# Patient Record
Sex: Female | Born: 1945
Health system: Southern US, Community
[De-identification: ages and names within clinical notes are randomized; demographics above are authoritative.]

## PROBLEM LIST (undated history)

## (undated) DIAGNOSIS — G5601 Carpal tunnel syndrome, right upper limb: Secondary | ICD-10-CM

## (undated) DIAGNOSIS — I10 Essential (primary) hypertension: Secondary | ICD-10-CM

## (undated) DIAGNOSIS — N200 Calculus of kidney: Secondary | ICD-10-CM

## (undated) DIAGNOSIS — E785 Hyperlipidemia, unspecified: Secondary | ICD-10-CM

## (undated) DIAGNOSIS — T7840XA Allergy, unspecified, initial encounter: Secondary | ICD-10-CM

## (undated) DIAGNOSIS — K219 Gastro-esophageal reflux disease without esophagitis: Secondary | ICD-10-CM

## (undated) DIAGNOSIS — E042 Nontoxic multinodular goiter: Secondary | ICD-10-CM

## (undated) DIAGNOSIS — H269 Unspecified cataract: Secondary | ICD-10-CM

## (undated) DIAGNOSIS — Z87442 Personal history of urinary calculi: Secondary | ICD-10-CM

## (undated) DIAGNOSIS — E118 Type 2 diabetes mellitus with unspecified complications: Secondary | ICD-10-CM

## (undated) HISTORY — PX: BREAST SURGERY: SHX581

## (undated) HISTORY — PX: TONSILLECTOMY: SUR1361

## (undated) HISTORY — DX: Unspecified cataract: H26.9

## (undated) HISTORY — DX: Type 2 diabetes mellitus with unspecified complications: E11.8

## (undated) HISTORY — PX: DILATION AND CURETTAGE OF UTERUS: SHX78

## (undated) HISTORY — PX: EYE SURGERY: SHX253

## (undated) HISTORY — PX: ABDOMINAL HYSTERECTOMY: SHX81

## (undated) HISTORY — DX: Nontoxic multinodular goiter: E04.2

## (undated) HISTORY — DX: Calculus of kidney: N20.0

## (undated) HISTORY — DX: Carpal tunnel syndrome, right upper limb: G56.01

## (undated) HISTORY — DX: Allergy, unspecified, initial encounter: T78.40XA

## (undated) HISTORY — PX: OTHER SURGICAL HISTORY: SHX169

---

## 2014-04-18 LAB — HM DEXA SCAN

## 2015-01-25 LAB — HM COLONOSCOPY

## 2016-04-17 ENCOUNTER — Other Ambulatory Visit: Payer: Self-pay | Admitting: Medical Oncology

## 2016-04-17 DIAGNOSIS — R946 Abnormal results of thyroid function studies: Secondary | ICD-10-CM

## 2016-04-28 ENCOUNTER — Ambulatory Visit
Admission: RE | Admit: 2016-04-28 | Discharge: 2016-04-28 | Disposition: A | Discharge status: Home / Self Care | Payer: Medicare Other | Source: Ambulatory Visit | Attending: Medical Oncology | Admitting: Medical Oncology

## 2016-04-28 DIAGNOSIS — E042 Nontoxic multinodular goiter: Secondary | ICD-10-CM | POA: Diagnosis not present

## 2016-04-28 DIAGNOSIS — R946 Abnormal results of thyroid function studies: Secondary | ICD-10-CM | POA: Diagnosis not present

## 2016-05-14 ENCOUNTER — Other Ambulatory Visit: Payer: Self-pay | Admitting: Otolaryngology

## 2016-05-14 DIAGNOSIS — E041 Nontoxic single thyroid nodule: Secondary | ICD-10-CM

## 2016-05-20 ENCOUNTER — Other Ambulatory Visit: Payer: Self-pay | Admitting: Otolaryngology

## 2016-05-20 ENCOUNTER — Ambulatory Visit
Admission: RE | Admit: 2016-05-20 | Discharge: 2016-05-20 | Disposition: A | Discharge status: Home / Self Care | Payer: Medicare Other | Source: Ambulatory Visit | Attending: Otolaryngology | Admitting: Otolaryngology

## 2016-05-20 DIAGNOSIS — E041 Nontoxic single thyroid nodule: Secondary | ICD-10-CM

## 2016-05-20 DIAGNOSIS — E042 Nontoxic multinodular goiter: Secondary | ICD-10-CM | POA: Diagnosis not present

## 2016-05-20 HISTORY — DX: Gastro-esophageal reflux disease without esophagitis: K21.9

## 2016-05-20 HISTORY — DX: Essential (primary) hypertension: I10

## 2016-05-20 HISTORY — DX: Hyperlipidemia, unspecified: E78.5

## 2016-05-27 LAB — HM MAMMOGRAPHY

## 2016-06-09 DIAGNOSIS — E042 Nontoxic multinodular goiter: Secondary | ICD-10-CM | POA: Insufficient documentation

## 2016-11-13 DIAGNOSIS — H52223 Regular astigmatism, bilateral: Secondary | ICD-10-CM | POA: Diagnosis not present

## 2016-11-13 DIAGNOSIS — H40013 Open angle with borderline findings, low risk, bilateral: Secondary | ICD-10-CM | POA: Diagnosis not present

## 2016-11-13 DIAGNOSIS — H2513 Age-related nuclear cataract, bilateral: Secondary | ICD-10-CM | POA: Diagnosis not present

## 2016-11-13 DIAGNOSIS — H40053 Ocular hypertension, bilateral: Secondary | ICD-10-CM | POA: Diagnosis not present

## 2016-12-02 DIAGNOSIS — R739 Hyperglycemia, unspecified: Secondary | ICD-10-CM | POA: Diagnosis not present

## 2016-12-02 DIAGNOSIS — I10 Essential (primary) hypertension: Secondary | ICD-10-CM | POA: Diagnosis not present

## 2016-12-11 ENCOUNTER — Encounter: Payer: Self-pay | Admitting: Internal Medicine

## 2016-12-11 ENCOUNTER — Other Ambulatory Visit: Payer: Self-pay | Admitting: Internal Medicine

## 2016-12-11 DIAGNOSIS — E782 Mixed hyperlipidemia: Secondary | ICD-10-CM | POA: Insufficient documentation

## 2016-12-11 DIAGNOSIS — I10 Essential (primary) hypertension: Secondary | ICD-10-CM | POA: Insufficient documentation

## 2016-12-11 DIAGNOSIS — K219 Gastro-esophageal reflux disease without esophagitis: Secondary | ICD-10-CM | POA: Insufficient documentation

## 2016-12-11 DIAGNOSIS — M8580 Other specified disorders of bone density and structure, unspecified site: Secondary | ICD-10-CM | POA: Insufficient documentation

## 2016-12-11 DIAGNOSIS — N200 Calculus of kidney: Secondary | ICD-10-CM | POA: Insufficient documentation

## 2016-12-11 DIAGNOSIS — E785 Hyperlipidemia, unspecified: Secondary | ICD-10-CM | POA: Insufficient documentation

## 2016-12-11 DIAGNOSIS — E1169 Type 2 diabetes mellitus with other specified complication: Secondary | ICD-10-CM | POA: Insufficient documentation

## 2016-12-16 ENCOUNTER — Other Ambulatory Visit: Payer: Self-pay | Admitting: Internal Medicine

## 2016-12-16 ENCOUNTER — Ambulatory Visit (INDEPENDENT_AMBULATORY_CARE_PROVIDER_SITE_OTHER): Payer: Medicare Other | Admitting: Internal Medicine

## 2016-12-16 ENCOUNTER — Encounter: Payer: Self-pay | Admitting: Internal Medicine

## 2016-12-16 VITALS — BP 130/78 | HR 55 | Ht 63.0 in | Wt 176.8 lb

## 2016-12-16 DIAGNOSIS — Z860100 Personal history of colon polyps, unspecified: Secondary | ICD-10-CM

## 2016-12-16 DIAGNOSIS — M8580 Other specified disorders of bone density and structure, unspecified site: Secondary | ICD-10-CM

## 2016-12-16 DIAGNOSIS — E042 Nontoxic multinodular goiter: Secondary | ICD-10-CM | POA: Diagnosis not present

## 2016-12-16 DIAGNOSIS — Z1231 Encounter for screening mammogram for malignant neoplasm of breast: Secondary | ICD-10-CM

## 2016-12-16 DIAGNOSIS — K219 Gastro-esophageal reflux disease without esophagitis: Secondary | ICD-10-CM | POA: Diagnosis not present

## 2016-12-16 DIAGNOSIS — I10 Essential (primary) hypertension: Secondary | ICD-10-CM

## 2016-12-16 DIAGNOSIS — E782 Mixed hyperlipidemia: Secondary | ICD-10-CM

## 2016-12-16 NOTE — Progress Notes (Signed)
Date:  12/16/2016   Name:  Dawn Fields   DOB:  11/16/1945   MRN:  [id number]   Chief Complaint: Establish Care (Needs new PCP- insurance no longer accepted doctor in network.) Hypertension  Pertinent negatives include no chest pain, headaches, palpitations or shortness of breath. Identifiable causes of hypertension include a thyroid problem.  Gastroesophageal Reflux  She complains of heartburn. She reports no abdominal pain, no chest pain or no coughing. This is a recurrent problem. The problem occurs occasionally. Pertinent negatives include no fatigue. She has tried a histamine-2 antagonist for the symptoms. The treatment provided significant relief.  Hyperlipidemia  This is a chronic problem. The problem is uncontrolled. Pertinent negatives include no chest pain or shortness of breath. Current antihyperlipidemic treatment includes herbal therapy (intolerant of statins). Compliance problems include medication side effects.   Thyroid Problem  Patient reports no fatigue or palpitations. Her past medical history is significant for hyperlipidemia.  Pre-diabetes - had lost weight and changed her diet and A1c has been much better.  Last A1C = 6 last month.    Review of Systems  Constitutional: Negative for chills, fatigue and fever.  Respiratory: Negative for cough, chest tightness and shortness of breath.   Cardiovascular: Negative for chest pain, palpitations and leg swelling.  Gastrointestinal: Positive for heartburn. Negative for abdominal pain and blood in stool.  Genitourinary: Negative for dysuria.  Musculoskeletal: Negative for arthralgias.  Skin: Negative for color change and rash.  Neurological: Negative for dizziness and headaches.  Hematological: Negative for adenopathy.  Psychiatric/Behavioral: Negative for dysphoric mood.    Patient Active Problem List   Diagnosis Date Noted  . Essential hypertension 12/11/2016  . Mixed hyperlipidemia 12/11/2016  . Gastroesophageal  reflux disease 12/11/2016  . Kidney stones 12/11/2016  . Osteopenia 12/11/2016  . Multinodular goiter 06/09/2016  . Hyperglycemia 12/05/2015    Prior to Admission medications   Medication Sig Start Date End Date Taking? Authorizing Provider  aspirin EC 81 MG tablet Take 81 mg by mouth daily.   Yes [provider]  cholecalciferol (VITAMIN D) 400 units TABS tablet Take 2,000 Units by mouth 2 (two) times daily.   Yes [provider]  LISINOPRIL PO Take 20 mg by mouth once.   Yes [provider]  Omega-3 Fatty Acids (FISH OIL) 1200 MG CAPS Take 1,200 mg by mouth 1 day or 1 dose.   Yes [provider]  ONE TOUCH ULTRA TEST test strip  11/22/16  Yes [provider]  [place]  11/22/16  Yes [provider]  RaNITidine HCl (ZANTAC 75 PO) Take 150 mg by mouth as needed.   Yes [provider]  vitamin B-12 (CYANOCOBALAMIN) 1000 MCG tablet Take 5,000 mcg by mouth daily.   Yes [provider]    Allergies  Allergen Reactions  . Esomeprazole Magnesium Other (See Comments)    Other Reaction: GI UPSET  . Rosuvastatin Other (See Comments)    Joint pain    Past Surgical History:  Procedure Laterality Date  . ABDOMINAL HYSTERECTOMY     Partial - Still have ovaries. - Cyst on uterus   . TONSILLECTOMY      Social History  Substance Use Topics  . Smoking status: Never Smoker  . Smokeless tobacco: Never Used  . Alcohol use No     Medication list has been reviewed and updated.   Physical Exam  Constitutional: She is oriented to person, place, and time. She appears well-developed.  No distress.  HENT:  Head: Normocephalic and atraumatic.  Neck: Normal range of motion. Neck supple. Carotid bruit is not present.  Cardiovascular: Normal rate, regular rhythm and normal heart sounds.   Pulmonary/Chest: Effort normal and breath sounds normal. No respiratory distress. She has no wheezes. She has no  rales.  Musculoskeletal: She exhibits no edema or tenderness.  Neurological: She is alert and oriented to person, place, and time. She has normal reflexes.  Skin: Skin is warm and dry. No rash noted.  Psychiatric: She has a normal mood and affect. Her speech is normal and behavior is normal. Thought content normal.  Nursing note and vitals reviewed.   BP 130/78 (BP Location: Right Arm, Patient Position: Sitting, Cuff Size: Normal)   Pulse (!) 55   Ht 5\' 3"  (1.6 m)   Wt 176 lb 12.8 oz (80.2 kg)   SpO2 98%   BMI 31.32 kg/m   Assessment and Plan: 1. Essential hypertension controlled  2. Gastroesophageal reflux disease, esophagitis presence not specified Stable on H2 blocker  3. Multinodular goiter Followed by Dr. Kathyrn Sheriff  4. Mixed hyperlipidemia Intolerant of statins She accepts the risk - will continue fish oil, healthy diet and exercise  5. History of colonic polyps Due for colonoscopy next year  6. Encounter for screening mammogram for breast cancer - MM DIGITAL SCREENING BILATERAL  7. Osteopenia, unspecified location Continue exercise daily   No orders of the defined types were placed in this encounter.   Halina Maidens, MD [facility]  12/16/2016

## 2017-03-23 ENCOUNTER — Telehealth: Payer: Self-pay | Admitting: Internal Medicine

## 2017-03-23 NOTE — Telephone Encounter (Signed)
Called pt to schedule for Annual Wellness Visit with Nurse Health Advisor, Tiffany Hill, my c/b # is [phone]  Kathryn Brown ° °

## 2017-04-09 DIAGNOSIS — H52223 Regular astigmatism, bilateral: Secondary | ICD-10-CM | POA: Diagnosis not present

## 2017-04-09 DIAGNOSIS — H40013 Open angle with borderline findings, low risk, bilateral: Secondary | ICD-10-CM | POA: Diagnosis not present

## 2017-04-26 ENCOUNTER — Other Ambulatory Visit: Payer: Self-pay | Admitting: Otolaryngology

## 2017-04-26 DIAGNOSIS — E041 Nontoxic single thyroid nodule: Secondary | ICD-10-CM

## 2017-05-19 ENCOUNTER — Encounter: Payer: Self-pay | Admitting: Internal Medicine

## 2017-05-19 DIAGNOSIS — Z1231 Encounter for screening mammogram for malignant neoplasm of breast: Secondary | ICD-10-CM | POA: Diagnosis not present

## 2017-05-19 LAB — HM MAMMOGRAPHY

## 2017-05-19 NOTE — Progress Notes (Unsigned)
[id number] 

## 2017-05-25 ENCOUNTER — Ambulatory Visit
Admission: RE | Admit: 2017-05-25 | Discharge: 2017-05-25 | Disposition: A | Discharge status: Home / Self Care | Payer: Medicare Other | Source: Ambulatory Visit | Attending: Otolaryngology | Admitting: Otolaryngology

## 2017-05-25 DIAGNOSIS — E042 Nontoxic multinodular goiter: Secondary | ICD-10-CM | POA: Diagnosis not present

## 2017-05-25 DIAGNOSIS — E041 Nontoxic single thyroid nodule: Secondary | ICD-10-CM

## 2017-06-01 DIAGNOSIS — E041 Nontoxic single thyroid nodule: Secondary | ICD-10-CM | POA: Diagnosis not present

## 2017-06-07 DIAGNOSIS — H6121 Impacted cerumen, right ear: Secondary | ICD-10-CM | POA: Diagnosis not present

## 2017-06-07 DIAGNOSIS — M26609 Unspecified temporomandibular joint disorder, unspecified side: Secondary | ICD-10-CM | POA: Diagnosis not present

## 2017-06-07 DIAGNOSIS — E042 Nontoxic multinodular goiter: Secondary | ICD-10-CM | POA: Diagnosis not present

## 2017-06-07 DIAGNOSIS — H60339 Swimmer's ear, unspecified ear: Secondary | ICD-10-CM | POA: Diagnosis not present

## 2017-06-07 DIAGNOSIS — H9319 Tinnitus, unspecified ear: Secondary | ICD-10-CM | POA: Diagnosis not present

## 2017-06-23 ENCOUNTER — Encounter: Payer: Self-pay | Admitting: Internal Medicine

## 2017-06-23 ENCOUNTER — Ambulatory Visit (INDEPENDENT_AMBULATORY_CARE_PROVIDER_SITE_OTHER): Payer: Medicare Other | Admitting: Internal Medicine

## 2017-06-23 VITALS — BP 110/78 | HR 72 | Ht 63.0 in | Wt 181.0 lb

## 2017-06-23 DIAGNOSIS — Z23 Encounter for immunization: Secondary | ICD-10-CM

## 2017-06-23 DIAGNOSIS — Z0000 Encounter for general adult medical examination without abnormal findings: Secondary | ICD-10-CM | POA: Diagnosis not present

## 2017-06-23 DIAGNOSIS — R739 Hyperglycemia, unspecified: Secondary | ICD-10-CM

## 2017-06-23 DIAGNOSIS — K219 Gastro-esophageal reflux disease without esophagitis: Secondary | ICD-10-CM | POA: Diagnosis not present

## 2017-06-23 DIAGNOSIS — E042 Nontoxic multinodular goiter: Secondary | ICD-10-CM

## 2017-06-23 DIAGNOSIS — E782 Mixed hyperlipidemia: Secondary | ICD-10-CM

## 2017-06-23 DIAGNOSIS — I10 Essential (primary) hypertension: Secondary | ICD-10-CM | POA: Diagnosis not present

## 2017-06-23 LAB — POCT URINALYSIS DIPSTICK
Bilirubin, UA: NEGATIVE
Blood, UA: NEGATIVE
Glucose, UA: NEGATIVE
Ketones, UA: NEGATIVE
Leukocytes, UA: NEGATIVE
Nitrite, UA: NEGATIVE
Protein, UA: NEGATIVE
Spec Grav, UA: 1.025
Urobilinogen, UA: 0.2 U/dL
pH, UA: 6

## 2017-06-23 MED ORDER — ZOSTER VAC RECOMB ADJUVANTED 50 MCG/0.5ML IM SUSR
0.5000 mL | Freq: Once | INTRAMUSCULAR | 1 refills | Status: AC
Start: 2017-06-23 — End: 2017-06-23

## 2017-06-23 NOTE — Addendum Note (Signed)
Addended by: Glean Hess on: 06/23/2017 12:09 PM   Modules accepted: Level of Service

## 2017-06-23 NOTE — Patient Instructions (Addendum)
Health Maintenance  Topic Date Due  . INFLUENZA VACCINE  03/10/2018  . MAMMOGRAM  05/19/2018  . TETANUS/TDAP  03/07/2020  . COLONOSCOPY  01/24/2025  . DEXA SCAN  Completed  . Hepatitis C Screening  Completed  . PNA vac Low Risk Adult  Completed

## 2017-06-23 NOTE — Progress Notes (Signed)
Patient: Dawn Fields, Female    DOB: [birth year], 71 y.o.   MRN: [id number] Visit Date: 06/23/2017  Today's Provider: Halina Maidens, MD   Chief Complaint  Patient presents with  . Medicare Wellness    Breast Exam.   . Hypertension  . Gastroesophageal Reflux   Subjective:    Annual wellness visit Dawn Fields is a 71 y.o. female who presents today for her Subsequent Annual Wellness Visit. She feels well. She reports exercising 4 days a week to the gym. She reports she is sleeping well. Mammogram done recently.  ----------------------------------------------------------- Hypertension  Pertinent negatives include no chest pain, headaches, palpitations or shortness of breath. Identifiable causes of hypertension include a thyroid problem.  Gastroesophageal Reflux  She reports no abdominal pain, no chest pain, no coughing or no wheezing. Pertinent negatives include no fatigue.  Thyroid Problem  Presents for follow-up (seen by ENT.  Recent US showed multiple small nodules too small to FNA) visit. Patient reports no anxiety, constipation, diarrhea, fatigue, palpitations or tremors. Her past medical history is significant for hyperlipidemia.  Hyperlipidemia  This is a chronic problem. The problem is uncontrolled. Pertinent negatives include no chest pain or shortness of breath. She is currently on no antihyperlipidemic treatment (did not tolerate statins).  Pre-diabetes - has been controlled with diet and weight loss.   Review of Systems  Constitutional: Negative for chills, fatigue and fever.  HENT: Negative for congestion, hearing loss, tinnitus, trouble swallowing and voice change.   Eyes: Negative for visual disturbance.  Respiratory: Negative for cough, chest tightness, shortness of breath and wheezing.   Cardiovascular: Negative for chest pain, palpitations and leg swelling.  Gastrointestinal: Negative for abdominal pain, constipation, diarrhea and vomiting.  Endocrine: Negative  for polydipsia and polyuria.  Genitourinary: Negative for dysuria, frequency, genital sores, vaginal bleeding and vaginal discharge.  Musculoskeletal: Negative for arthralgias, gait problem and joint swelling.  Skin: Negative for color change and rash.  Neurological: Negative for dizziness, tremors, light-headedness and headaches.  Hematological: Negative for adenopathy. Does not bruise/bleed easily.  Psychiatric/Behavioral: Negative for dysphoric mood and sleep disturbance. The patient is not nervous/anxious.     Social History   Socioeconomic History  . Marital status: Single    Spouse name: Not on file  . Number of children: 1  . Years of education: Not on file  . Highest education level: Not on file  Social Needs  . Financial resource strain: Not hard at all  . Food insecurity - worry: Never true  . Food insecurity - inability: Never true  . Transportation needs - medical: No  . Transportation needs - non-medical: No  Occupational History  . Occupation: retired  Tobacco Use  . Smoking status: Never Smoker  . Smokeless tobacco: Never Used  Substance and Sexual Activity  . Alcohol use: No  . Drug use: No  . Sexual activity: Not on file  Other Topics Concern  . Not on file  Social History Narrative  . Not on file    Patient Active Problem List   Diagnosis Date Noted  . History of colonic polyps 12/16/2016  . Essential hypertension 12/11/2016  . Mixed hyperlipidemia 12/11/2016  . Gastroesophageal reflux disease 12/11/2016  . Kidney stones 12/11/2016  . Osteopenia 12/11/2016  . Multinodular goiter 06/09/2016  . Hyperglycemia 12/05/2015    Past Surgical History:  Procedure Laterality Date  . ABDOMINAL HYSTERECTOMY     Partial - Still have ovaries. - Cyst on uterus   . TONSILLECTOMY  Her family history includes Diabetes in her brother, maternal grandfather, maternal grandmother, and sister; Hypertension in her mother; Liver cancer in her father; Stroke in her  brother and mother.     Current Meds  Medication Sig  . aspirin EC 81 MG tablet Take 81 mg by mouth daily.  . cholecalciferol (VITAMIN D) 400 units TABS tablet Take 2,000 Units by mouth 2 (two) times daily.  Marland Kitchen LISINOPRIL PO Take 20 mg by mouth once.  . Omega-3 Fatty Acids (FISH OIL) 1200 MG CAPS Take 1,200 mg by mouth 1 day or 1 dose.  . ONE TOUCH ULTRA TEST test strip   . RaNITidine HCl (ZANTAC 75 PO) Take 150 mg by mouth as needed.    Patient Care Team: Glean Hess, MD as PCP - General (Internal Medicine) Margaretha Sheffield, MD (Otolaryngology)       Objective:   Vitals: BP 110/78   Pulse 72   Ht 5\' 3"  (1.6 m)   Wt 181 lb (82.1 kg)   SpO2 98%   BMI 32.06 kg/m   Physical Exam  Constitutional: She is oriented to person, place, and time. She appears well-developed and well-nourished. No distress.  HENT:  Head: Normocephalic and atraumatic.  Right Ear: Tympanic membrane and ear canal normal.  Left Ear: Tympanic membrane and ear canal normal.  Nose: Right sinus exhibits no maxillary sinus tenderness. Left sinus exhibits no maxillary sinus tenderness.  Mouth/Throat: Uvula is midline and oropharynx is clear and moist.  Eyes: Conjunctivae and EOM are normal. Right eye exhibits no discharge. Left eye exhibits no discharge. No scleral icterus.  Neck: Normal range of motion. Carotid bruit is not present. No erythema present. No thyromegaly present.  Cardiovascular: Normal rate, regular rhythm, normal heart sounds and normal pulses.  Pulmonary/Chest: Effort normal. No respiratory distress. She has no wheezes. Right breast exhibits no mass, no nipple discharge, no skin change and no tenderness. Left breast exhibits no mass, no nipple discharge, no skin change and no tenderness.  Abdominal: Soft. Bowel sounds are normal. There is no hepatosplenomegaly. There is no tenderness. There is no CVA tenderness.  Musculoskeletal: Normal range of motion.  Lymphadenopathy:    She has no cervical  adenopathy.    She has no axillary adenopathy.  Neurological: She is alert and oriented to person, place, and time. She has normal reflexes. No cranial nerve deficit or sensory deficit.  Skin: Skin is warm, dry and intact. No rash noted.  Psychiatric: She has a normal mood and affect. Her speech is normal and behavior is normal. Thought content normal.  Nursing note and vitals reviewed.   Activities of Daily Living In your present state of health, do you have any difficulty performing the following activities: 06/23/2017  Hearing? N  Vision? N  Difficulty concentrating or making decisions? N  Walking or climbing stairs? N  Dressing or bathing? N  Doing errands, shopping? N  Preparing Food and eating ? N  Using the Toilet? N  In the past six months, have you accidently leaked urine? N  Do you have problems with loss of bowel control? N  Managing your Medications? N  Managing your Finances? N  Housekeeping or managing your Housekeeping? N  Some recent data might be hidden    Fall Risk Assessment Fall Risk  06/23/2017  Falls in the past year? No     Depression Screen PHQ 2/9 Scores 06/23/2017  PHQ - 2 Score 0    6CIT Screen 06/23/2017  What Year?  0 points  What month? 0 points  What time? 0 points  Count back from 20 0 points  Months in reverse 0 points  Repeat phrase 0 points  Total Score 0    Medicare Annual Wellness Visit Summary:  Reviewed patient's Family Medical History Reviewed and updated list of patient's medical providers Assessment of cognitive impairment was done Assessed patient's functional ability Established a written schedule for health screening [place] Completed and Reviewed  Exercise Activities and Dietary recommendations Goals    None      Immunization History  Administered Date(s) Administered  . Influenza, Seasonal, Injecte, Preservative Fre 05/09/2014  . Influenza,inj,Quad PF,6+ Mos 06/02/2016  .  Influenza-Unspecified 05/09/2014, 05/27/2015, 05/10/2017  . Pneumococcal Conjugate-13 11/06/2013  . Pneumococcal Polysaccharide-23 08/10/2010  . Tdap 03/07/2010  . Zoster 11/02/2012    Health Maintenance  Topic Date Due  . INFLUENZA VACCINE  03/10/2017  . MAMMOGRAM  05/20/2019  . TETANUS/TDAP  03/07/2020  . COLONOSCOPY  01/24/2025  . DEXA SCAN  Completed  . Hepatitis C Screening  Completed  . PNA vac Low Risk Adult  Completed    Discussed health benefits of physical activity, and encouraged her to engage in regular exercise appropriate for her age and condition.    ------------------------------------------------------------------------------------------------------------  Assessment & Plan:  1. Medicare annual wellness visit, subsequent Measures satisfied  2. Essential hypertension controlled - CBC with Differential/Platelet - Comprehensive metabolic panel - POCT urinalysis dipstick  3. Gastroesophageal reflux disease without esophagitis Controlled on zantac  4. Multinodular goiter Followed by ENT  5. Mixed hyperlipidemia Continue supplements Intolerant of statins - Lipid panel  6. Hyperglycemia Continue diet and exercise Monitor BS - Hemoglobin A1c   No orders of the defined types were placed in this encounter.   Partially dictated using Editor, commissioning. Any errors are unintentional.  Halina Maidens, MD [facility]  06/23/2017

## 2017-06-24 LAB — CBC WITH DIFFERENTIAL/PLATELET
BASOS ABS: 0 10*3/uL (ref 0.0–0.2)
Basos: 0 %
EOS (ABSOLUTE): 0.1 10*3/uL (ref 0.0–0.4)
Eos: 2 %
Hematocrit: 34.8 % (ref 34.0–46.6)
Hemoglobin: 11.7 g/dL (ref 11.1–15.9)
IMMATURE GRANULOCYTES: 0 %
Immature Grans (Abs): 0 10*3/uL (ref 0.0–0.1)
Lymphocytes Absolute: 2.5 10*3/uL (ref 0.7–3.1)
Lymphs: 53 %
MCH: 29.2 pg (ref 26.6–33.0)
MCHC: 33.6 g/dL (ref 31.5–35.7)
MCV: 87 fL (ref 79–97)
Monocytes Absolute: 0.4 10*3/uL (ref 0.1–0.9)
Monocytes: 9 %
NEUTROS PCT: 36 %
Neutrophils Absolute: 1.7 10*3/uL (ref 1.4–7.0)
Platelets: 330 10*3/uL (ref 150–379)
RBC: 4.01 x10E6/uL (ref 3.77–5.28)
RDW: 14 % (ref 12.3–15.4)
WBC: 4.7 10*3/uL (ref 3.4–10.8)

## 2017-06-24 LAB — COMPREHENSIVE METABOLIC PANEL
ALT: 15 IU/L (ref 0–32)
AST: 19 IU/L (ref 0–40)
Albumin/Globulin Ratio: 1.7 (ref 1.2–2.2)
Albumin: 4.7 g/dL (ref 3.5–4.8)
Alkaline Phosphatase: 77 IU/L (ref 39–117)
BUN/Creatinine Ratio: 13 (ref 12–28)
BUN: 12 mg/dL (ref 8–27)
Bilirubin Total: 0.4 mg/dL (ref 0.0–1.2)
CALCIUM: 9.6 mg/dL (ref 8.7–10.3)
CHLORIDE: 103 mmol/L (ref 96–106)
CO2: 24 mmol/L (ref 20–29)
Creatinine, Ser: 0.94 mg/dL (ref 0.57–1.00)
GFR, EST AFRICAN AMERICAN: 70 mL/min/{1.73_m2} (ref >59)
GFR, EST NON AFRICAN AMERICAN: 60 mL/min/{1.73_m2} (ref >59)
GLUCOSE: 92 mg/dL (ref 65–99)
Globulin, Total: 2.7 g/dL (ref 1.5–4.5)
Potassium: 4.6 mmol/L (ref 3.5–5.2)
Sodium: 140 mmol/L (ref 134–144)
TOTAL PROTEIN: 7.4 g/dL (ref 6.0–8.5)

## 2017-06-24 LAB — HEMOGLOBIN A1C
Est. average glucose Bld gHb Est-mCnc: 134 mg/dL
HEMOGLOBIN A1C: 6.3 % — AB (ref 4.8–5.6)

## 2017-06-24 LAB — LIPID PANEL
CHOL/HDL RATIO: 2.3 ratio (ref 0.0–4.4)
Cholesterol, Total: 212 mg/dL — ABNORMAL HIGH (ref 100–199)
HDL: 91 mg/dL (ref >39)
LDL Calculated: 102 mg/dL — ABNORMAL HIGH (ref 0–99)
Triglycerides: 96 mg/dL (ref 0–149)
VLDL CHOLESTEROL CAL: 19 mg/dL (ref 5–40)

## 2017-08-16 ENCOUNTER — Other Ambulatory Visit: Payer: Self-pay | Admitting: Internal Medicine

## 2017-08-16 ENCOUNTER — Other Ambulatory Visit: Payer: Self-pay

## 2017-10-14 DIAGNOSIS — H2513 Age-related nuclear cataract, bilateral: Secondary | ICD-10-CM | POA: Diagnosis not present

## 2017-10-14 DIAGNOSIS — H04123 Dry eye syndrome of bilateral lacrimal glands: Secondary | ICD-10-CM | POA: Diagnosis not present

## 2017-10-14 DIAGNOSIS — H52223 Regular astigmatism, bilateral: Secondary | ICD-10-CM | POA: Diagnosis not present

## 2017-10-14 DIAGNOSIS — H40013 Open angle with borderline findings, low risk, bilateral: Secondary | ICD-10-CM | POA: Diagnosis not present

## 2017-11-23 ENCOUNTER — Telehealth: Payer: Self-pay | Admitting: Internal Medicine

## 2017-11-23 NOTE — Telephone Encounter (Signed)
resched awv  °

## 2017-12-07 ENCOUNTER — Other Ambulatory Visit: Payer: Self-pay | Admitting: Internal Medicine

## 2017-12-24 ENCOUNTER — Ambulatory Visit: Payer: Medicare Other | Admitting: Internal Medicine

## 2017-12-24 ENCOUNTER — Encounter: Payer: Self-pay | Admitting: Internal Medicine

## 2017-12-24 VITALS — BP 118/78 | HR 68 | Resp 16 | Ht 63.0 in | Wt 179.0 lb

## 2017-12-24 DIAGNOSIS — I10 Essential (primary) hypertension: Secondary | ICD-10-CM | POA: Diagnosis not present

## 2017-12-24 DIAGNOSIS — E118 Type 2 diabetes mellitus with unspecified complications: Secondary | ICD-10-CM | POA: Insufficient documentation

## 2017-12-24 DIAGNOSIS — E782 Mixed hyperlipidemia: Secondary | ICD-10-CM | POA: Diagnosis not present

## 2017-12-24 DIAGNOSIS — M25362 Other instability, left knee: Secondary | ICD-10-CM

## 2017-12-24 DIAGNOSIS — R7303 Prediabetes: Secondary | ICD-10-CM

## 2017-12-24 MED ORDER — LISINOPRIL 20 MG PO TABS: 20.0000 mg | ORAL_TABLET | Freq: Every day | ORAL | 3 refills | Status: DC

## 2017-12-24 NOTE — Progress Notes (Signed)
Date:  12/24/2017   Name:  Dawn Fields   DOB:  1946/07/27   MRN:  [id number]   Chief Complaint: Hypertension Hypertension  This is a chronic problem. The problem is controlled. Pertinent negatives include no chest pain, headaches, palpitations or shortness of breath. Past treatments include ACE inhibitors.  Diabetes  She presents for her follow-up diabetic visit. Diabetes type: pre-diabetes. Pertinent negatives for hypoglycemia include no dizziness, headaches or tremors. Pertinent negatives for diabetes include no chest pain, no fatigue, no polydipsia and no polyuria. Current diabetic treatment includes diet. Her weight is decreasing steadily. She monitors blood glucose at home 1-2 x per day. Her breakfast blood glucose is taken between 6-7 am. Her breakfast blood glucose range is generally 110-130 mg/dl. An ACE inhibitor/angiotensin II receptor blocker is being taken.  Hyperlipidemia  This is a chronic problem. The problem is uncontrolled. Pertinent negatives include no chest pain or shortness of breath. Current antihyperlipidemic treatment includes diet change, exercise and herbal therapy.  Knee Pain   The injury mechanism was a direct blow (10 years ago -). The pain is present in the left knee. The patient is experiencing no pain. Associated symptoms include muscle weakness (sense of weakness with exertion). Pertinent negatives include no numbness.   Lab Results  Component Value Date   HGBA1C 6.3 (H) 06/23/2017   Lab Results  Component Value Date   CREATININE 0.94 06/23/2017   BUN 12 06/23/2017   NA 140 06/23/2017   K 4.6 06/23/2017   CL 103 06/23/2017   CO2 24 06/23/2017      Review of Systems  Constitutional: Negative for appetite change, fatigue, fever and unexpected weight change.  HENT: Negative for tinnitus and trouble swallowing.   Eyes: Negative for visual disturbance.  Respiratory: Negative for cough, chest tightness and shortness of breath.   Cardiovascular:  Negative for chest pain, palpitations and leg swelling.  Gastrointestinal: Negative for abdominal pain.  Endocrine: Negative for polydipsia and polyuria.  Genitourinary: Negative for dysuria and hematuria.  Musculoskeletal: Negative for arthralgias.       Has some intermittent sensation of weakness in her left knee - wears a brace to exercise; has not fallen  Neurological: Negative for dizziness, tremors, numbness and headaches.  Psychiatric/Behavioral: Negative for dysphoric mood.    Patient Active Problem List   Diagnosis Date Noted  . Prediabetes 12/24/2017  . History of colonic polyps 12/16/2016  . Essential hypertension 12/11/2016  . Mixed hyperlipidemia 12/11/2016  . Gastroesophageal reflux disease 12/11/2016  . Kidney stones 12/11/2016  . Osteopenia 12/11/2016  . Multinodular goiter 06/09/2016  . Hyperglycemia 12/05/2015    Prior to Admission medications   Medication Sig Start Date End Date Taking? Authorizing Provider  aspirin EC 81 MG tablet Take 81 mg by mouth daily.    [provider]  cholecalciferol (VITAMIN D) 400 units TABS tablet Take 2,000 Units by mouth 2 (two) times daily.    [provider]  glucose blood (ONE TOUCH ULTRA TEST) test strip USE AS DIRECTED ONCE DAILY 08/16/17   Glean Hess, MD  lisinopril (PRINIVIL,ZESTRIL) 20 MG tablet TAKE 1 TABLET BY MOUTH EVERY DAY *NEED APPT FOR FURTHER REFILLS* 12/07/17   Glean Hess, MD  Omega-3 Fatty Acids (FISH OIL) 1200 MG CAPS Take 1,200 mg by mouth 1 day or 1 dose.    [provider]  [facility] DELICA LANCETS FINE MISC USE AS DIRECTED ONCE DAILY 08/16/17   Glean Hess, MD  Plant Sterols and  Stanols (CHOLESTOFF PLUS PO) Take by mouth.    [provider]  RaNITidine HCl (ZANTAC 75 PO) Take 150 mg by mouth as needed.    [provider]  vitamin B-12 (CYANOCOBALAMIN) 1000 MCG tablet Take 5,000 mcg by mouth daily.    [provider]    Allergies  Allergen  Reactions  . Esomeprazole Magnesium Other (See Comments)    Other Reaction: GI UPSET  . Rosuvastatin Other (See Comments)    Joint pain    Past Surgical History:  Procedure Laterality Date  . ABDOMINAL HYSTERECTOMY     Partial - Still have ovaries. - Cyst on uterus   . TONSILLECTOMY      Social History   Tobacco Use  . Smoking status: Never Smoker  . Smokeless tobacco: Never Used  Substance Use Topics  . Alcohol use: No  . Drug use: No   Wt Readings from Last 3 Encounters:  12/24/17 179 lb (81.2 kg)  06/23/17 181 lb (82.1 kg)  12/16/16 176 lb 12.8 oz (80.2 kg)     Medication list has been reviewed and updated.  PHQ 2/9 Scores 06/23/2017  PHQ - 2 Score 0    Physical Exam  Constitutional: She is oriented to person, place, and time. She appears well-developed. No distress.  HENT:  Head: Normocephalic and atraumatic.  Eyes: Pupils are equal, round, and reactive to light.  Neck: Normal range of motion. Neck supple. Carotid bruit is not present.  Cardiovascular: Normal rate, regular rhythm and normal heart sounds.  Pulmonary/Chest: Effort normal and breath sounds normal. No respiratory distress.  Musculoskeletal: Normal range of motion.       Left knee: She exhibits normal range of motion and no effusion. No tenderness found.       Legs: Neurological: She is alert and oriented to person, place, and time.  Skin: Skin is warm and dry. No rash noted.  Psychiatric: She has a normal mood and affect. Her speech is normal and behavior is normal. Thought content normal.  Nursing note and vitals reviewed.   BP 118/78   Pulse 68   Resp 16   Ht 5\' 3"  (1.6 m)   Wt 179 lb (81.2 kg)   SpO2 98%   BMI 31.71 kg/m   Assessment and Plan: 1. Essential hypertension Controlled, continue regular exercise - lisinopril (PRINIVIL,ZESTRIL) 20 MG tablet; Take 1 tablet (20 mg total) by mouth daily.  Dispense: 90 tablet; Refill: 3  2. Prediabetes Fasting BS ~ 120  3. Mixed  hyperlipidemia Pt still declines statins - discussed that most of her risk is related to age, rather than BP or BS  4. Knee instability, left Continue brace for exercise If worsening, will need Orthopedic evaluation   Meds ordered this encounter  Medications  . lisinopril (PRINIVIL,ZESTRIL) 20 MG tablet    Sig: Take 1 tablet (20 mg total) by mouth daily.    Dispense:  90 tablet    Refill:  3    Partially dictated using Editor, commissioning. Any errors are unintentional.  Halina Maidens, MD [facility]  12/24/2017

## 2017-12-25 LAB — HEMOGLOBIN A1C
ESTIMATED AVERAGE GLUCOSE: 128 mg/dL
HEMOGLOBIN A1C: 6.1 % — AB (ref 4.8–5.6)

## 2018-02-04 ENCOUNTER — Encounter: Payer: Self-pay | Admitting: Internal Medicine

## 2018-02-04 DIAGNOSIS — Z86010 Personal history of colonic polyps: Secondary | ICD-10-CM | POA: Diagnosis not present

## 2018-02-04 DIAGNOSIS — K5730 Diverticulosis of large intestine without perforation or abscess without bleeding: Secondary | ICD-10-CM | POA: Diagnosis not present

## 2018-02-04 DIAGNOSIS — D125 Benign neoplasm of sigmoid colon: Secondary | ICD-10-CM | POA: Diagnosis not present

## 2018-02-04 HISTORY — PX: COLONOSCOPY: SHX174

## 2018-02-04 LAB — HM COLONOSCOPY

## 2018-04-14 DIAGNOSIS — H25013 Cortical age-related cataract, bilateral: Secondary | ICD-10-CM | POA: Diagnosis not present

## 2018-04-14 DIAGNOSIS — H401131 Primary open-angle glaucoma, bilateral, mild stage: Secondary | ICD-10-CM | POA: Diagnosis not present

## 2018-04-14 DIAGNOSIS — H2513 Age-related nuclear cataract, bilateral: Secondary | ICD-10-CM | POA: Diagnosis not present

## 2018-04-14 DIAGNOSIS — H40053 Ocular hypertension, bilateral: Secondary | ICD-10-CM | POA: Diagnosis not present

## 2018-05-04 DIAGNOSIS — H401131 Primary open-angle glaucoma, bilateral, mild stage: Secondary | ICD-10-CM | POA: Diagnosis not present

## 2018-05-04 DIAGNOSIS — H25013 Cortical age-related cataract, bilateral: Secondary | ICD-10-CM | POA: Diagnosis not present

## 2018-05-04 DIAGNOSIS — H40053 Ocular hypertension, bilateral: Secondary | ICD-10-CM | POA: Diagnosis not present

## 2018-05-04 DIAGNOSIS — H2513 Age-related nuclear cataract, bilateral: Secondary | ICD-10-CM | POA: Diagnosis not present

## 2018-05-24 DIAGNOSIS — Z23 Encounter for immunization: Secondary | ICD-10-CM | POA: Diagnosis not present

## 2018-06-01 DIAGNOSIS — Z1231 Encounter for screening mammogram for malignant neoplasm of breast: Secondary | ICD-10-CM | POA: Diagnosis not present

## 2018-06-23 ENCOUNTER — Ambulatory Visit: Payer: Medicare Other

## 2018-06-27 ENCOUNTER — Ambulatory Visit (INDEPENDENT_AMBULATORY_CARE_PROVIDER_SITE_OTHER): Payer: Medicare Other | Admitting: Internal Medicine

## 2018-06-27 ENCOUNTER — Encounter: Payer: Self-pay | Admitting: Internal Medicine

## 2018-06-27 VITALS — BP 128/80 | HR 70 | Ht 63.0 in | Wt 182.0 lb

## 2018-06-27 DIAGNOSIS — I10 Essential (primary) hypertension: Secondary | ICD-10-CM

## 2018-06-27 DIAGNOSIS — R7303 Prediabetes: Secondary | ICD-10-CM

## 2018-06-27 DIAGNOSIS — E042 Nontoxic multinodular goiter: Secondary | ICD-10-CM | POA: Diagnosis not present

## 2018-06-27 DIAGNOSIS — Z1231 Encounter for screening mammogram for malignant neoplasm of breast: Secondary | ICD-10-CM

## 2018-06-27 DIAGNOSIS — K219 Gastro-esophageal reflux disease without esophagitis: Secondary | ICD-10-CM | POA: Diagnosis not present

## 2018-06-27 DIAGNOSIS — Z0000 Encounter for general adult medical examination without abnormal findings: Secondary | ICD-10-CM | POA: Diagnosis not present

## 2018-06-27 LAB — POCT URINALYSIS DIPSTICK
Bilirubin, UA: NEGATIVE
GLUCOSE UA: NEGATIVE
Ketones, UA: NEGATIVE
LEUKOCYTES UA: NEGATIVE
NITRITE UA: NEGATIVE
PROTEIN UA: NEGATIVE
RBC UA: NEGATIVE
SPEC GRAV UA: 1.015 (ref 1.010–1.025)
Urobilinogen, UA: 0.2 E.U./dL
pH, UA: 6 (ref 5.0–8.0)

## 2018-06-27 NOTE — Progress Notes (Signed)
Date:  06/27/2018   Name:  Dawn Fields   DOB:  03/13/1946   MRN:  [id number]   Chief Complaint: Annual Exam (Medicare wellness is scheduled to be done in two days. Breast Exam. ) Dawn Fields is a 72 y.o. female who presents today for her Complete Annual Exam. She feels well. She reports exercising four times a week. She reports she is sleeping well. She is up to date on immunizations.  Mammogram was done in October at [facility].  Hypertension  This is a chronic problem. The problem is controlled. Pertinent negatives include no chest pain, headaches, palpitations or shortness of breath. Past treatments include ACE inhibitors. The current treatment provides significant improvement. There are no compliance problems.   Gastroesophageal Reflux  She reports no abdominal pain, no chest pain, no coughing or no wheezing. This is a recurrent problem. The problem occurs rarely. Pertinent negatives include no fatigue. She has tried an antacid for the symptoms. The treatment provided significant relief.  Glaucoma - recent diagnosis - now on drops and has planned follow up.  Review of Systems  Constitutional: Negative for chills, fatigue and fever.  HENT: Negative for congestion, hearing loss, tinnitus, trouble swallowing and voice change.   Eyes: Positive for visual disturbance (being followed for glaucoma).  Respiratory: Negative for cough, chest tightness, shortness of breath and wheezing.   Cardiovascular: Negative for chest pain, palpitations and leg swelling.  Gastrointestinal: Negative for abdominal pain, constipation, diarrhea and vomiting.  Endocrine: Negative for polydipsia and polyuria.  Genitourinary: Negative for dysuria, frequency, genital sores, vaginal bleeding and vaginal discharge.  Musculoskeletal: Negative for arthralgias, gait problem and joint swelling.  Skin: Negative for color change and rash.  Allergic/Immunologic: Negative for environmental allergies.  Neurological:  Negative for dizziness, tremors, light-headedness and headaches.  Hematological: Negative for adenopathy. Does not bruise/bleed easily.  Psychiatric/Behavioral: Negative for dysphoric mood and sleep disturbance. The patient is not nervous/anxious.     Patient Active Problem List   Diagnosis Date Noted  . Prediabetes 12/24/2017  . Knee instability, left 12/24/2017  . History of colonic polyps 12/16/2016  . Essential hypertension 12/11/2016  . Mixed hyperlipidemia 12/11/2016  . Gastroesophageal reflux disease 12/11/2016  . Kidney stones 12/11/2016  . Osteopenia 12/11/2016  . Multinodular goiter 06/09/2016    Allergies  Allergen Reactions  . Esomeprazole Magnesium Other (See Comments)    Other Reaction: GI UPSET  . Rosuvastatin Other (See Comments)    Joint pain    Past Surgical History:  Procedure Laterality Date  . ABDOMINAL HYSTERECTOMY     Partial - Still have ovaries. - Cyst on uterus   . COLONOSCOPY  02/04/2018   one polyp -   . TONSILLECTOMY      Social History   Tobacco Use  . Smoking status: Never Smoker  . Smokeless tobacco: Never Used  Substance Use Topics  . Alcohol use: No  . Drug use: No     Medication list has been reviewed and updated.  Current Meds  Medication Sig  . aspirin EC 81 MG tablet Take 81 mg by mouth daily.  . cholecalciferol (VITAMIN D) 400 units TABS tablet Take 2,000 Units by mouth 2 (two) times daily.  Marland Kitchen glucose blood (ONE TOUCH ULTRA TEST) test strip USE AS DIRECTED ONCE DAILY  . latanoprost (XALATAN) 0.005 % ophthalmic solution Place 1 drop into both eyes at bedtime.  Marland Kitchen lisinopril (PRINIVIL,ZESTRIL) 20 MG tablet Take 1 tablet (20 mg total) by mouth daily.  Marland Kitchen  Omega-3 Fatty Acids (FISH OIL) 1200 MG CAPS Take 1,200 mg by mouth 1 day or 1 dose.  Dawn Fields DELICA LANCETS FINE MISC USE AS DIRECTED ONCE DAILY  . Plant Sterols and Stanols (CHOLESTOFF PLUS PO) Take by mouth.  . RaNITidine HCl (ZANTAC 75 PO) Take 150 mg by mouth as  needed.  . vitamin B-12 (CYANOCOBALAMIN) 1000 MCG tablet Take 5,000 mcg by mouth daily.    PHQ 2/9 Scores 06/27/2018 06/23/2017  PHQ - 2 Score 1 0    Physical Exam  Constitutional: She is oriented to person, place, and time. She appears well-developed and well-nourished. No distress.  HENT:  Head: Normocephalic and atraumatic.  Right Ear: Tympanic membrane and ear canal normal.  Left Ear: Tympanic membrane and ear canal normal.  Nose: Right sinus exhibits no maxillary sinus tenderness. Left sinus exhibits no maxillary sinus tenderness.  Mouth/Throat: Uvula is midline and oropharynx is clear and moist. No posterior oropharyngeal erythema.  Eyes: Conjunctivae and EOM are normal. Right eye exhibits no discharge. Left eye exhibits no discharge. No scleral icterus.  Neck: Normal range of motion. Carotid bruit is not present. No erythema present. No thyroid mass and no thyromegaly present.  Cardiovascular: Normal rate, regular rhythm, normal heart sounds and normal pulses.  Pulmonary/Chest: Effort normal. No respiratory distress. She has no wheezes. Right breast exhibits no mass, no nipple discharge, no skin change and no tenderness. Left breast exhibits no mass, no nipple discharge, no skin change and no tenderness.  Abdominal: Soft. Bowel sounds are normal. There is no hepatosplenomegaly. There is no tenderness. There is no CVA tenderness.  Musculoskeletal: Normal range of motion.  Lymphadenopathy:    She has no cervical adenopathy.    She has no axillary adenopathy.  Neurological: She is alert and oriented to person, place, and time. She has normal strength. No cranial nerve deficit or sensory deficit.  Reflex Scores:      Bicep reflexes are 1+ on the right side and 1+ on the left side.      Patellar reflexes are 1+ on the right side and 1+ on the left side. Skin: Skin is warm, dry and intact. No rash noted.  Psychiatric: She has a normal mood and affect. Her speech is normal and behavior  is normal. Thought content normal.  Nursing note and vitals reviewed.   BP 128/80 (BP Location: Right Arm, Patient Position: Sitting, Cuff Size: Normal)   Pulse 70   Ht 5\' 3"  (1.6 m)   Wt 182 lb (82.6 kg)   SpO2 97%   BMI 32.24 kg/m   Assessment and Plan: 1. Annual physical exam Continue regular exercise - Lipid panel - POCT urinalysis dipstick  2. Encounter for screening mammogram for breast cancer Done recently - normal  3. Essential hypertension controlled - Comprehensive metabolic panel  4. Gastroesophageal reflux disease without esophagitis Stable off of medication for now -can take Pepcid if needed - CBC with Differential/Platelet  5. Multinodular goiter asx - Thyroid Panel With TSH  6. Prediabetes Continue diet, exercise and intermittent FSBS - Hemoglobin A1c   Partially dictated using Editor, commissioning. Any errors are unintentional.  Halina Maidens, MD [facility]  06/27/2018

## 2018-06-28 LAB — CBC WITH DIFFERENTIAL/PLATELET
BASOS ABS: 0 10*3/uL (ref 0.0–0.2)
Basos: 1 %
EOS (ABSOLUTE): 0.1 10*3/uL (ref 0.0–0.4)
Eos: 2 %
Hematocrit: 39.5 % (ref 34.0–46.6)
Hemoglobin: 13.3 g/dL (ref 11.1–15.9)
Immature Grans (Abs): 0 10*3/uL (ref 0.0–0.1)
Immature Granulocytes: 0 %
LYMPHS ABS: 2.5 10*3/uL (ref 0.7–3.1)
Lymphs: 53 %
MCH: 29.6 pg (ref 26.6–33.0)
MCHC: 33.7 g/dL (ref 31.5–35.7)
MCV: 88 fL (ref 79–97)
MONOS ABS: 0.4 10*3/uL (ref 0.1–0.9)
Monocytes: 8 %
Neutrophils Absolute: 1.7 10*3/uL (ref 1.4–7.0)
Neutrophils: 36 %
Platelets: 309 10*3/uL (ref 150–450)
RBC: 4.49 x10E6/uL (ref 3.77–5.28)
RDW: 12.5 % (ref 12.3–15.4)
WBC: 4.7 10*3/uL (ref 3.4–10.8)

## 2018-06-28 LAB — COMPREHENSIVE METABOLIC PANEL
ALK PHOS: 82 IU/L (ref 39–117)
ALT: 21 IU/L (ref 0–32)
AST: 22 IU/L (ref 0–40)
Albumin/Globulin Ratio: 1.5 (ref 1.2–2.2)
Albumin: 4.4 g/dL (ref 3.5–4.8)
BILIRUBIN TOTAL: 0.3 mg/dL (ref 0.0–1.2)
BUN / CREAT RATIO: 15 (ref 12–28)
BUN: 15 mg/dL (ref 8–27)
CO2: 21 mmol/L (ref 20–29)
CREATININE: 1 mg/dL (ref 0.57–1.00)
Calcium: 9.9 mg/dL (ref 8.7–10.3)
Chloride: 101 mmol/L (ref 96–106)
GFR, EST AFRICAN AMERICAN: 64 mL/min/{1.73_m2} (ref >59)
GFR, EST NON AFRICAN AMERICAN: 56 mL/min/{1.73_m2} — AB (ref >59)
GLOBULIN, TOTAL: 2.9 g/dL (ref 1.5–4.5)
Glucose: 102 mg/dL — ABNORMAL HIGH (ref 65–99)
Potassium: 4.4 mmol/L (ref 3.5–5.2)
Sodium: 138 mmol/L (ref 134–144)
Total Protein: 7.3 g/dL (ref 6.0–8.5)

## 2018-06-28 LAB — HEMOGLOBIN A1C
Est. average glucose Bld gHb Est-mCnc: 134 mg/dL
Hgb A1c MFr Bld: 6.3 % — ABNORMAL HIGH (ref 4.8–5.6)

## 2018-06-28 LAB — LIPID PANEL
CHOL/HDL RATIO: 2.8 ratio (ref 0.0–4.4)
Cholesterol, Total: 246 mg/dL — ABNORMAL HIGH (ref 100–199)
HDL: 88 mg/dL (ref >39)
LDL CALC: 137 mg/dL — AB (ref 0–99)
Triglycerides: 107 mg/dL (ref 0–149)
VLDL CHOLESTEROL CAL: 21 mg/dL (ref 5–40)

## 2018-06-28 LAB — THYROID PANEL WITH TSH
FREE THYROXINE INDEX: 1.7 (ref 1.2–4.9)
T3 Uptake Ratio: 24 % (ref 24–39)
T4, Total: 7 ug/dL (ref 4.5–12.0)
TSH: 1.56 u[IU]/mL (ref 0.450–4.500)

## 2018-06-29 ENCOUNTER — Ambulatory Visit: Payer: Medicare Other

## 2018-06-29 VITALS — BP 132/74 | HR 72 | Temp 97.6°F | Resp 16 | Ht 63.0 in | Wt 184.0 lb

## 2018-06-29 DIAGNOSIS — Z0000 Encounter for general adult medical examination without abnormal findings: Secondary | ICD-10-CM

## 2018-06-29 DIAGNOSIS — Z780 Asymptomatic menopausal state: Secondary | ICD-10-CM

## 2018-06-29 NOTE — Progress Notes (Signed)
Subjective:   Dawn Fields is a 72 y.o. female who presents for Medicare Annual (Subsequent) preventive examination.  Review of Systems:   Cardiac Risk Factors include: advanced age (>92men, >105 women);hypertension;obesity (BMI >30kg/m2);dyslipidemia     Objective:     Vitals: BP 132/74 (BP Location: Left Arm, Patient Position: Sitting, Cuff Size: Normal)   Pulse 72   Temp 97.6 F (36.4 C) (Oral)   Resp 16   Ht 5\' 3"  (1.6 m)   Wt 184 lb (83.5 kg)   BMI 32.59 kg/m   Body mass index is 32.59 kg/m.  Advanced Directives 06/29/2018 12/16/2016 05/20/2016  Does Patient Have a Medical Advance Directive? Yes Yes No  Type of Paramedic of [place];Living will Living will -  Copy of [place] in Chart? No - copy requested - -  Would patient like information on creating a medical advance directive? - - No - patient declined information    Tobacco Social History   Tobacco Use  Smoking Status Never Smoker  Smokeless Tobacco Never Used     Counseling given: Not Answered   Clinical Intake:  Pre-visit preparation completed: Yes  Pain : No/denies pain     Nutritional Status: BMI > 30  Obese Diabetes: No  What is the last grade level you completed in school?: associate degree  Interpreter Needed?: No  Information entered by :: Clemetine Marker LPN   Nutrition Risk Assessment:  Has the patient had any N/V/D within the last 2 months?  No  Does the patient have any non-healing wounds?  No  Has the patient had any unintentional weight loss or weight gain?  No   Diabetes:  Is the patient diabetic?  Yes  pt considered to have prediabetes If diabetic, was a CBG obtained today?  No  Did the patient bring in their glucometer from home?  No  How often do you monitor your CBG's? Once daily in the morning.   Financial Strains and Diabetes Management:  Are you having any financial strains with the device, your supplies or your medication?  No .     Past Medical History:  Diagnosis Date  . GERD (gastroesophageal reflux disease)   . Hyperlipidemia   . Hypertension    Past Surgical History:  Procedure Laterality Date  . ABDOMINAL HYSTERECTOMY     Partial - Still have ovaries. - Cyst on uterus   . COLONOSCOPY  02/04/2018   one polyp -   . TONSILLECTOMY     Family History  Problem Relation Age of Onset  . Stroke Mother   . Hypertension Mother   . Liver cancer Father   . COPD Sister   . Diabetes Sister   . Diabetes Brother   . Stroke Brother   . Diabetes Maternal Grandmother   . Diabetes Maternal Grandfather   . Diabetes Sister   . Mental illness Sister    Social History   Socioeconomic History  . Marital status: Single    Spouse name: Not on file  . Number of children: 1  . Years of education: Not on file  . Highest education level: Associate degree: academic program  Occupational History  . Occupation: retired    Comment: Theatre manager  Social Needs  . Financial resource strain: Not hard at all  . Food insecurity:    Worry: Never true    Inability: Never true  . Transportation needs:    Medical: No    Non-medical: No  Tobacco  Use  . Smoking status: Never Smoker  . Smokeless tobacco: Never Used  Substance and Sexual Activity  . Alcohol use: No  . Drug use: No  . Sexual activity: Not on file  Lifestyle  . Physical activity:    Days per week: 4 days    Minutes per session: 60 min  . Stress: Only a little  Relationships  . Social connections:    Talks on phone: More than three times a week    Gets together: Once a week    Attends religious service: Never    Active member of club or organization: No    Attends meetings of clubs or organizations: Never    Relationship status: Never married  Other Topics Concern  . Not on file  Social History Narrative  . Not on file    Outpatient Encounter Medications as of 06/29/2018  Medication Sig  . aspirin EC 81 MG tablet Take 81 mg by  mouth daily.  . cholecalciferol (VITAMIN D) 400 units TABS tablet Take 2,000 Units by mouth 2 (two) times daily.  Marland Kitchen glucose blood (ONE TOUCH ULTRA TEST) test strip USE AS DIRECTED ONCE DAILY  . latanoprost (XALATAN) 0.005 % ophthalmic solution Place 1 drop into both eyes at bedtime.  Marland Kitchen lisinopril (PRINIVIL,ZESTRIL) 20 MG tablet Take 1 tablet (20 mg total) by mouth daily.  . Omega-3 Fatty Acids (FISH OIL) 1200 MG CAPS Take 1,200 mg by mouth 1 day or 1 dose.  Glory Rosebush DELICA LANCETS FINE MISC USE AS DIRECTED ONCE DAILY  . Plant Sterols and Stanols (CHOLESTOFF PLUS PO) Take by mouth.  . vitamin B-12 (CYANOCOBALAMIN) 1000 MCG tablet Take 5,000 mcg by mouth daily.   No facility-administered encounter medications on file as of 06/29/2018.     Activities of Daily Living In your present state of health, do you have any difficulty performing the following activities: 06/29/2018 06/27/2018  Hearing? N N  Comment declines hearing aids -  Vision? N N  Comment wears eyeglasses -  Difficulty concentrating or making decisions? N N  Walking or climbing stairs? N N  Dressing or bathing? N N  Doing errands, shopping? N N  Preparing Food and eating ? N -  Using the Toilet? N -  In the past six months, have you accidently leaked urine? N -  Do you have problems with loss of bowel control? N -  Managing your Medications? N -  Managing your Finances? N -  Housekeeping or managing your Housekeeping? N -  Some recent data might be hidden    Patient Care Team: Glean Hess, MD as PCP - General (Internal Medicine) Margaretha Sheffield, MD (Otolaryngology) Luster Landsberg, MD as Referring Physician (Gastroenterology) Stanford Breed, MD as Referring Physician (Cardiology)    Assessment:   This is a routine wellness examination for [facility].  Exercise Activities and Dietary recommendations Current Exercise Habits: Structured exercise class, Type of exercise: treadmill;strength  training/weights, Time (Minutes): 60, Frequency (Times/Week): 4, Weekly Exercise (Minutes/Week): 240, Intensity: Moderate, Exercise limited by: None identified  Goals    . Weight (lb) < 200 lb (90.7 kg)     Pt would like to set a goal for 5% weight loss. Continue exercise and healthy eating habits.        Fall Risk Fall Risk  06/29/2018 06/27/2018 06/23/2017  Falls in the past year? 0 0 No  Number falls in past yr: 0 0 -  Injury with Fall? - 0 -  Risk for fall  due to : - History of fall(s) -  Follow up - Falls evaluation completed -   FALL RISK PREVENTION PERTAINING TO THE HOME:  Any stairs in or around the home WITH handrails? Yes  Home free of loose throw rugs in walkways, pet beds, electrical cords, etc? Yes  Adequate lighting in your home to reduce risk of falls? Yes   ASSISTIVE DEVICES UTILIZED TO PREVENT FALLS:  Life alert? No  Use of a cane, walker or w/c? No  Grab bars in the bathroom? Yes  Shower chair or bench in shower? No  Elevated toilet seat or a handicapped toilet? No   DME ORDERS:  DME order needed?  No   TIMED UP AND GO:  Was the test performed? Yes .  Length of time to ambulate 10 feet: 6 sec.   GAIT:  Appearance of gait: Gait stead-fast and without the use of an assistive device.  Education: Fall risk prevention has been discussed.  Intervention(s) required? No   Depression Screen PHQ 2/9 Scores 06/29/2018 06/27/2018 06/23/2017  PHQ - 2 Score 1 1 0     Cognitive Function pt declined 6CIT     6CIT Screen 06/23/2017  What Year? 0 points  What month? 0 points  What time? 0 points  Count back from 20 0 points  Months in reverse 0 points  Repeat phrase 0 points  Total Score 0    Immunization History  Administered Date(s) Administered  . Influenza, High Dose Seasonal PF 05/09/2018  . Influenza, Seasonal, Injecte, Preservative Fre 05/09/2014  . Influenza,inj,Quad PF,6+ Mos 06/02/2016  . Influenza-Unspecified 05/09/2014, 05/27/2015,  05/10/2017  . Pneumococcal Conjugate-13 11/06/2013  . Pneumococcal Polysaccharide-23 08/10/2010  . Tdap 03/07/2010  . Zoster 11/02/2012  . Zoster Recombinat (Shingrix) 05/24/2018    Qualifies for Shingles Vaccine? Yes  Zostavax completed 06/04/13. Due for second Shingrix vaccine to be done at pharmacy.   Tdap: Up to date  Flu Vaccine:Up to date  Pneumococcal Vaccine: Due March 2020.  Screening Tests Health Maintenance  Topic Date Due  . MAMMOGRAM  06/02/2019  . TETANUS/TDAP  03/07/2020  . COLONOSCOPY  02/05/2028  . INFLUENZA VACCINE  Completed  . DEXA SCAN  Completed  . Hepatitis C Screening  Completed  . PNA vac Low Risk Adult  Completed    Cancer Screenings:  Colorectal Screening: Completed 02/04/18. Repeat every 10 years; Marland Kitchen  Mammogram: Completed 06/01/18. Repeat every year; done at [facility]  Bone Density: Completed 04/18/14. Results reflect OSTEOPENIA, Repeat every 2 years. Ordered today. Pt provided with contact info and advised to call to schedule appt.   Lung Cancer Screening: (Low Dose CT Chest recommended if Age 30-80 years, 30 pack-year currently smoking OR have quit w/in 15years.) does not qualify.    Additional Screening:  Hepatitis C Screening: does qualify; Completed 12/04/13  Vision Screening: Recommended annual ophthalmology exams for early detection of glaucoma and other disorders of the eye. Is the patient up to date with their annual eye exam?  Yes  Who is the provider or what is the name of the office in which the pt attends annual eye exams? Dr. Matilde Sprang  Dental Screening: Recommended annual dental exams for proper oral hygiene  Community Resource Referral:  CRR required this visit?  No       Plan:    I have personally reviewed and addressed the Medicare Annual Wellness questionnaire and have noted the following in the patient's chart:  A. Medical and social history B. Use of  alcohol, tobacco or illicit drugs  C. Current medications and  supplements D. Functional ability and status E.  Nutritional status F.  Physical activity G. Advance directives H. List of other physicians I.  Hospitalizations, surgeries, and ER visits in previous 12 months J.  Central such as hearing and vision if needed, cognitive and depression L. Referrals and appointments   In addition, I have reviewed and discussed with patient certain preventive protocols, quality metrics, and best practice recommendations. A written personalized care plan for preventive services as well as general preventive health recommendations were provided to patient.   Signed,  Clemetine Marker, LPN Nurse Health Advisor   Nurse Notes: pt appreciative of exam today with no concerns.

## 2018-06-29 NOTE — Patient Instructions (Addendum)
Dawn Fields , Thank you for taking time to come for your Medicare Wellness Visit. I appreciate your ongoing commitment to your health goals. Please review the following plan we discussed and let me know if I can assist you in the future.   Screening recommendations/referrals: Colonoscopy: done 02/04/18  Repeat in 10 years Mammogram: done 06/01/18 repeat in October Bone Density: done 04/18/14 schedule at same time as mammogram. Please call [phone] to schedule your bone density exam. Recommended yearly ophthalmology/optometry visit for glaucoma screening and checkup Recommended yearly dental visit for hygiene and checkup  Vaccinations: Influenza vaccine: done 05/09/18 Pneumococcal vaccine: done 11/06/13  Tdap vaccine: done 03/07/10 Shingles vaccine: Shingrix done 05/24/18 please schedule your second vaccine at the pharmacy    Advanced directives: Please bring a copy of your health care power of attorney and living will to the office at your convenience.  Conditions/risks identified: Continue healthy eating habits and physical activity   Next appointment: Please follow up in one year for your Medicare Annual Wellness visit.     Preventive Care 34 Years and Older, Female Preventive care refers to lifestyle choices and visits with your health care provider that can promote health and wellness. What does preventive care include?  A yearly physical exam. This is also called an annual well check.  Dental exams once or twice a year.  Routine eye exams. Ask your health care provider how often you should have your eyes checked.  Personal lifestyle choices, including:  Daily care of your teeth and gums.  Regular physical activity.  Eating a healthy diet.  Avoiding tobacco and drug use.  Limiting alcohol use.  Practicing safe sex.  Taking low-dose aspirin every day.  Taking vitamin and mineral supplements as recommended by your health care provider. What happens during an annual  well check? The services and screenings done by your health care provider during your annual well check will depend on your age, overall health, lifestyle risk factors, and family history of disease. Counseling  Your health care provider may ask you questions about your:  Alcohol use.  Tobacco use.  Drug use.  Emotional well-being.  Home and relationship well-being.  Sexual activity.  Eating habits.  History of falls.  Memory and ability to understand (cognition).  Work and work Statistician.  Reproductive health. Screening  You may have the following tests or measurements:  Height, weight, and BMI.  Blood pressure.  Lipid and cholesterol levels. These may be checked every 5 years, or more frequently if you are over 58 years old.  Skin check.  Lung cancer screening. You may have this screening every year starting at age 58 if you have a 30-pack-year history of smoking and currently smoke or have quit within the past 15 years.  Fecal occult blood test (FOBT) of the stool. You may have this test every year starting at age 45.  Flexible sigmoidoscopy or colonoscopy. You may have a sigmoidoscopy every 5 years or a colonoscopy every 10 years starting at age 41.  Hepatitis C blood test.  Hepatitis B blood test.  Sexually transmitted disease (STD) testing.  Diabetes screening. This is done by checking your blood sugar (glucose) after you have not eaten for a while (fasting). You may have this done every 1-3 years.  Bone density scan. This is done to screen for osteoporosis. You may have this done starting at age 60.  Mammogram. This may be done every 1-2 years. Talk to your health care provider about how often you  should have regular mammograms. Talk with your health care provider about your test results, treatment options, and if necessary, the need for more tests. Vaccines  Your health care provider may recommend certain vaccines, such as:  Influenza vaccine. This  is recommended every year.  Tetanus, diphtheria, and acellular pertussis (Tdap, Td) vaccine. You may need a Td booster every 10 years.  Zoster vaccine. You may need this after age 91.  Pneumococcal 13-valent conjugate (PCV13) vaccine. One dose is recommended after age 25.  Pneumococcal polysaccharide (PPSV23) vaccine. One dose is recommended after age 38. Talk to your health care provider about which screenings and vaccines you need and how often you need them. This information is not intended to replace advice given to you by your health care provider. Make sure you discuss any questions you have with your health care provider. Document Released: 08/23/2015 Document Revised: 04/15/2016 Document Reviewed: 05/28/2015 Elsevier Interactive Patient Education  2017 [employer] Prevention in the Home Falls can cause injuries. They can happen to people of all ages. There are many things you can do to make your home safe and to help prevent falls. What can I do on the outside of my home?  Regularly fix the edges of walkways and driveways and fix any cracks.  Remove anything that might make you trip as you walk through a door, such as a raised step or threshold.  Trim any bushes or trees on the path to your home.  Use bright outdoor lighting.  Clear any walking paths of anything that might make someone trip, such as rocks or tools.  Regularly check to see if handrails are loose or broken. Make sure that both sides of any steps have handrails.  Any raised decks and porches should have guardrails on the edges.  Have any leaves, snow, or ice cleared regularly.  Use sand or salt on walking paths during winter.  Clean up any spills in your garage right away. This includes oil or grease spills. What can I do in the bathroom?  Use night lights.  Install grab bars by the toilet and in the tub and shower. Do not use towel bars as grab bars.  Use non-skid mats or decals in the tub or  shower.  If you need to sit down in the shower, use a plastic, non-slip stool.  Keep the floor dry. Clean up any water that spills on the floor as soon as it happens.  Remove soap buildup in the tub or shower regularly.  Attach bath mats securely with double-sided non-slip rug tape.  Do not have throw rugs and other things on the floor that can make you trip. What can I do in the bedroom?  Use night lights.  Make sure that you have a light by your bed that is easy to reach.  Do not use any sheets or blankets that are too big for your bed. They should not hang down onto the floor.  Have a firm chair that has side arms. You can use this for support while you get dressed.  Do not have throw rugs and other things on the floor that can make you trip. What can I do in the kitchen?  Clean up any spills right away.  Avoid walking on wet floors.  Keep items that you use a lot in easy-to-reach places.  If you need to reach something above you, use a strong step stool that has a grab bar.  Keep electrical cords out  of the way.  Do not use floor polish or wax that makes floors slippery. If you must use wax, use non-skid floor wax.  Do not have throw rugs and other things on the floor that can make you trip. What can I do with my stairs?  Do not leave any items on the stairs.  Make sure that there are handrails on both sides of the stairs and use them. Fix handrails that are broken or loose. Make sure that handrails are as long as the stairways.  Check any carpeting to make sure that it is firmly attached to the stairs. Fix any carpet that is loose or worn.  Avoid having throw rugs at the top or bottom of the stairs. If you do have throw rugs, attach them to the floor with carpet tape.  Make sure that you have a light switch at the top of the stairs and the bottom of the stairs. If you do not have them, ask someone to add them for you. What else can I do to help prevent  falls?  Wear shoes that:  Do not have high heels.  Have rubber bottoms.  Are comfortable and fit you well.  Are closed at the toe. Do not wear sandals.  If you use a stepladder:  Make sure that it is fully opened. Do not climb a closed stepladder.  Make sure that both sides of the stepladder are locked into place.  Ask someone to hold it for you, if possible.  Clearly mark and make sure that you can see:  Any grab bars or handrails.  First and last steps.  Where the edge of each step is.  Use tools that help you move around (mobility aids) if they are needed. These include:  Canes.  Walkers.  Scooters.  Crutches.  Turn on the lights when you go into a dark area. Replace any light bulbs as soon as they burn out.  Set up your furniture so you have a clear path. Avoid moving your furniture around.  If any of your floors are uneven, fix them.  If there are any pets around you, be aware of where they are.  Review your medicines with your doctor. Some medicines can make you feel dizzy. This can increase your chance of falling. Ask your doctor what other things that you can do to help prevent falls. This information is not intended to replace advice given to you by your health care provider. Make sure you discuss any questions you have with your health care provider. Document Released: 05/23/2009 Document Revised: 01/02/2016 Document Reviewed: 08/31/2014 Elsevier Interactive Patient Education  2017 [employer].

## 2018-07-05 NOTE — Progress Notes (Signed)
Seen patient did not view on mychart so mailed results to patient.

## 2018-07-20 ENCOUNTER — Other Ambulatory Visit: Payer: Self-pay | Admitting: Internal Medicine

## 2018-08-18 DIAGNOSIS — H2513 Age-related nuclear cataract, bilateral: Secondary | ICD-10-CM | POA: Diagnosis not present

## 2018-08-18 DIAGNOSIS — H401131 Primary open-angle glaucoma, bilateral, mild stage: Secondary | ICD-10-CM | POA: Diagnosis not present

## 2018-08-18 DIAGNOSIS — H40053 Ocular hypertension, bilateral: Secondary | ICD-10-CM | POA: Diagnosis not present

## 2018-08-18 DIAGNOSIS — H25013 Cortical age-related cataract, bilateral: Secondary | ICD-10-CM | POA: Diagnosis not present

## 2018-09-06 ENCOUNTER — Ambulatory Visit
Admission: RE | Admit: 2018-09-06 | Discharge: 2018-09-06 | Disposition: A | Discharge status: Home / Self Care | Payer: Medicare Other | Source: Ambulatory Visit | Attending: Internal Medicine | Admitting: Internal Medicine

## 2018-09-06 DIAGNOSIS — Z780 Asymptomatic menopausal state: Secondary | ICD-10-CM | POA: Insufficient documentation

## 2018-09-06 DIAGNOSIS — M85851 Other specified disorders of bone density and structure, right thigh: Secondary | ICD-10-CM | POA: Diagnosis not present

## 2018-12-10 ENCOUNTER — Other Ambulatory Visit: Payer: Self-pay | Admitting: Internal Medicine

## 2018-12-21 ENCOUNTER — Other Ambulatory Visit: Payer: Self-pay | Admitting: Internal Medicine

## 2018-12-21 DIAGNOSIS — I10 Essential (primary) hypertension: Secondary | ICD-10-CM

## 2018-12-26 ENCOUNTER — Encounter: Payer: Self-pay | Admitting: Internal Medicine

## 2018-12-26 ENCOUNTER — Ambulatory Visit (INDEPENDENT_AMBULATORY_CARE_PROVIDER_SITE_OTHER): Payer: Medicare Other | Admitting: Internal Medicine

## 2018-12-26 ENCOUNTER — Other Ambulatory Visit: Payer: Self-pay

## 2018-12-26 VITALS — BP 118/80 | HR 69 | Ht 63.0 in | Wt 185.6 lb

## 2018-12-26 DIAGNOSIS — R7303 Prediabetes: Secondary | ICD-10-CM | POA: Diagnosis not present

## 2018-12-26 DIAGNOSIS — I10 Essential (primary) hypertension: Secondary | ICD-10-CM | POA: Diagnosis not present

## 2018-12-26 DIAGNOSIS — G5601 Carpal tunnel syndrome, right upper limb: Secondary | ICD-10-CM | POA: Insufficient documentation

## 2018-12-26 NOTE — Progress Notes (Signed)
Date:  12/26/2018   Name:  Dawn Fields   DOB:  October 26, 1945   MRN:  [id number]   Chief Complaint: Hypertension (follow up.)  Hypertension  This is a chronic problem. The problem is controlled. Pertinent negatives include no chest pain, headaches, palpitations or shortness of breath. Past treatments include ACE inhibitors.  Diabetes  She presents for her follow-up diabetic visit. Diabetes type: prediabetes. Pertinent negatives for hypoglycemia include no headaches or tremors. Pertinent negatives for diabetes include no chest pain, no fatigue and no polyuria. Current diabetic treatment includes diet. Her weight is stable. She is following a generally healthy diet. She participates in exercise three times a week. She monitors blood glucose at home 1-2 x per day. Her breakfast blood glucose is taken between 7-8 am. Her breakfast blood glucose range is generally 110-130 mg/dl. An ACE inhibitor/angiotensin II receptor blocker is being taken.  Tingling in right fingers - she does a lot of needle work and cooking and after longer days, she has tingling in her right fingers which will wake her up a night.  Lab Results  Component Value Date   HGBA1C 6.3 (H) 06/27/2018   Lab Results  Component Value Date   CREATININE 1.00 06/27/2018   BUN 15 06/27/2018   NA 138 06/27/2018   K 4.4 06/27/2018   CL 101 06/27/2018   CO2 21 06/27/2018   Lab Results  Component Value Date   TSH 1.560 06/27/2018   Lab Results  Component Value Date   CHOL 246 (H) 06/27/2018   HDL 88 06/27/2018   LDLCALC 137 (H) 06/27/2018   TRIG 107 06/27/2018   CHOLHDL 2.8 06/27/2018     Review of Systems  Constitutional: Negative for appetite change, fatigue, fever and unexpected weight change.  HENT: Negative for hearing loss, tinnitus and trouble swallowing.   Eyes: Negative for visual disturbance.  Respiratory: Negative for cough, chest tightness and shortness of breath.   Cardiovascular: Negative for chest pain,  palpitations and leg swelling.  Gastrointestinal: Negative for abdominal pain.  Endocrine: Negative for polyuria.  Genitourinary: Negative for dysuria.  Musculoskeletal: Negative for arthralgias.  Allergic/Immunologic: Positive for environmental allergies.  Neurological: Positive for numbness (in fingers of right hand at times). Negative for tremors and headaches.  Hematological: Negative for adenopathy.  Psychiatric/Behavioral: Negative for dysphoric mood.    Patient Active Problem List   Diagnosis Date Noted  . Prediabetes 12/24/2017  . Knee instability, left 12/24/2017  . History of colonic polyps 12/16/2016  . Essential hypertension 12/11/2016  . Mixed hyperlipidemia 12/11/2016  . Gastroesophageal reflux disease 12/11/2016  . Kidney stones 12/11/2016  . Osteopenia 12/11/2016  . Multinodular goiter 06/09/2016    Allergies  Allergen Reactions  . Esomeprazole Magnesium Other (See Comments)    Other Reaction: GI UPSET  . Rosuvastatin Other (See Comments)    Joint pain    Past Surgical History:  Procedure Laterality Date  . ABDOMINAL HYSTERECTOMY     Partial - Still have ovaries. - Cyst on uterus   . COLONOSCOPY  02/04/2018   one polyp -   . TONSILLECTOMY      Social History   Tobacco Use  . Smoking status: Never Smoker  . Smokeless tobacco: Never Used  Substance Use Topics  . Alcohol use: No  . Drug use: No     Medication list has been reviewed and updated.  Current Meds  Medication Sig  . aspirin EC 81 MG tablet Take 81 mg by mouth daily.  Marland Kitchen  cholecalciferol (VITAMIN D) 400 units TABS tablet Take 2,000 Units by mouth 2 (two) times daily.  Marland Kitchen glucose blood (ONE TOUCH ULTRA TEST) test strip USE AS DIRECTED ONCE DAILY  . latanoprost (XALATAN) 0.005 % ophthalmic solution Place 1 drop into both eyes at bedtime.  Marland Kitchen lisinopril (ZESTRIL) 20 MG tablet TAKE 1 TABLET BY MOUTH EVERY DAY  . Omega-3 Fatty Acids (FISH OIL) 1200 MG CAPS Take 1,200 mg by mouth 1 day or 1  dose.  Glory Rosebush Delica Lancets 00X MISC USE AS DIRECTED ONCE DAILY  . Plant Sterols and Stanols (CHOLESTOFF PLUS PO) Take by mouth.  . vitamin B-12 (CYANOCOBALAMIN) 1000 MCG tablet Take 5,000 mcg by mouth daily.    PHQ 2/9 Scores 12/26/2018 06/29/2018 06/27/2018 06/23/2017  PHQ - 2 Score 0 1 1 0    BP Readings from Last 3 Encounters:  12/26/18 118/80  06/29/18 132/74  06/27/18 128/80    Physical Exam Vitals signs and nursing note reviewed.  Constitutional:      General: She is not in acute distress.    Appearance: She is well-developed.  HENT:     Head: Normocephalic and atraumatic.  Pulmonary:     Effort: Pulmonary effort is normal. No respiratory distress.  Musculoskeletal: Normal range of motion.     Right lower leg: No edema.     Left lower leg: No edema.  Skin:    General: Skin is warm and dry.     Findings: No rash.  Neurological:     Mental Status: She is alert and oriented to person, place, and time.     Cranial Nerves: Cranial nerves are intact.     Sensory: Sensation is intact.     Deep Tendon Reflexes:     Reflex Scores:      Bicep reflexes are 2+ on the right side and 2+ on the left side.    Comments: Phalen's positive on the right  Psychiatric:        Attention and Perception: Attention normal.        Mood and Affect: Mood normal.        Behavior: Behavior normal.        Thought Content: Thought content normal.    Wt Readings from Last 3 Encounters:  12/26/18 185 lb 9.6 oz (84.2 kg)  06/29/18 184 lb (83.5 kg)  06/27/18 182 lb (82.6 kg)    BP 118/80   Pulse 69   Ht 5\' 3"  (1.6 m)   Wt 185 lb 9.6 oz (84.2 kg)   SpO2 98%   BMI 32.88 kg/m   Assessment and Plan: 1. Essential hypertension Controlled, continue current therapy  2. Prediabetes Continue to work on diet and weight - Hemoglobin A1c  3. Carpal tunnel syndrome of right wrist Recommend splints at night; Advil or tylenol if needed   Partially dictated using Editor, commissioning. Any  errors are unintentional.  Halina Maidens, MD [facility]  12/26/2018

## 2018-12-26 NOTE — Patient Instructions (Signed)
Get a wrist splint to wear while sleeping when carpal tunnel flares up.

## 2018-12-27 LAB — HEMOGLOBIN A1C
Est. average glucose Bld gHb Est-mCnc: 148 mg/dL
Hgb A1c MFr Bld: 6.8 % — ABNORMAL HIGH (ref 4.8–5.6)

## 2018-12-28 ENCOUNTER — Other Ambulatory Visit: Payer: Self-pay | Admitting: Internal Medicine

## 2018-12-28 DIAGNOSIS — R7303 Prediabetes: Secondary | ICD-10-CM

## 2018-12-28 MED ORDER — METFORMIN HCL ER 500 MG PO TB24: 500.0000 mg | ORAL_TABLET | Freq: Every day | ORAL | 1 refills | Status: DC

## 2018-12-28 NOTE — Progress Notes (Signed)
Patient called saying she seen her labs on My Chart and she will make a four month follow up. She just needs her metformin called in to her pharmacy.

## 2018-12-30 ENCOUNTER — Other Ambulatory Visit: Payer: Self-pay | Admitting: Internal Medicine

## 2018-12-30 DIAGNOSIS — I10 Essential (primary) hypertension: Secondary | ICD-10-CM

## 2019-01-13 DIAGNOSIS — H2513 Age-related nuclear cataract, bilateral: Secondary | ICD-10-CM | POA: Diagnosis not present

## 2019-01-13 DIAGNOSIS — H401131 Primary open-angle glaucoma, bilateral, mild stage: Secondary | ICD-10-CM | POA: Diagnosis not present

## 2019-01-13 DIAGNOSIS — H25013 Cortical age-related cataract, bilateral: Secondary | ICD-10-CM | POA: Diagnosis not present

## 2019-01-13 DIAGNOSIS — E119 Type 2 diabetes mellitus without complications: Secondary | ICD-10-CM | POA: Diagnosis not present

## 2019-01-13 DIAGNOSIS — H40053 Ocular hypertension, bilateral: Secondary | ICD-10-CM | POA: Diagnosis not present

## 2019-01-13 LAB — HM DIABETES EYE EXAM

## 2019-01-17 ENCOUNTER — Encounter: Payer: Self-pay | Admitting: Internal Medicine

## 2019-04-12 DIAGNOSIS — H269 Unspecified cataract: Secondary | ICD-10-CM | POA: Insufficient documentation

## 2019-04-12 DIAGNOSIS — H25013 Cortical age-related cataract, bilateral: Secondary | ICD-10-CM | POA: Diagnosis not present

## 2019-04-12 DIAGNOSIS — H40053 Ocular hypertension, bilateral: Secondary | ICD-10-CM | POA: Diagnosis not present

## 2019-04-12 DIAGNOSIS — H2513 Age-related nuclear cataract, bilateral: Secondary | ICD-10-CM | POA: Diagnosis not present

## 2019-04-12 DIAGNOSIS — E119 Type 2 diabetes mellitus without complications: Secondary | ICD-10-CM | POA: Diagnosis not present

## 2019-04-12 DIAGNOSIS — H401131 Primary open-angle glaucoma, bilateral, mild stage: Secondary | ICD-10-CM | POA: Diagnosis not present

## 2019-05-11 ENCOUNTER — Other Ambulatory Visit: Payer: Self-pay

## 2019-05-11 ENCOUNTER — Ambulatory Visit (INDEPENDENT_AMBULATORY_CARE_PROVIDER_SITE_OTHER): Payer: Medicare Other | Admitting: Internal Medicine

## 2019-05-11 ENCOUNTER — Encounter: Payer: Self-pay | Admitting: Internal Medicine

## 2019-05-11 VITALS — BP 118/78 | HR 67 | Ht 63.0 in | Wt 181.0 lb

## 2019-05-11 DIAGNOSIS — Z1231 Encounter for screening mammogram for malignant neoplasm of breast: Secondary | ICD-10-CM

## 2019-05-11 DIAGNOSIS — Z0000 Encounter for general adult medical examination without abnormal findings: Secondary | ICD-10-CM

## 2019-05-11 DIAGNOSIS — E042 Nontoxic multinodular goiter: Secondary | ICD-10-CM

## 2019-05-11 DIAGNOSIS — K219 Gastro-esophageal reflux disease without esophagitis: Secondary | ICD-10-CM | POA: Diagnosis not present

## 2019-05-11 DIAGNOSIS — E782 Mixed hyperlipidemia: Secondary | ICD-10-CM

## 2019-05-11 DIAGNOSIS — E118 Type 2 diabetes mellitus with unspecified complications: Secondary | ICD-10-CM | POA: Diagnosis not present

## 2019-05-11 DIAGNOSIS — I10 Essential (primary) hypertension: Secondary | ICD-10-CM

## 2019-05-11 LAB — POCT URINALYSIS DIPSTICK
Bilirubin, UA: NEGATIVE
Blood, UA: NEGATIVE
Glucose, UA: NEGATIVE
Ketones, UA: NEGATIVE
Leukocytes, UA: NEGATIVE
Nitrite, UA: NEGATIVE
Protein, UA: NEGATIVE
Spec Grav, UA: <=1.005 — AB (ref 1.010–1.025)
Urobilinogen, UA: 0.2 E.U./dL
pH, UA: 6 (ref 5.0–8.0)

## 2019-05-11 NOTE — Progress Notes (Signed)
Date:  05/11/2019   Name:  Dawn Fields   DOB:  [birth year]   MRN:  [id number]   Chief Complaint: Annual Exam (Breast exam.) Dawn Fields is a 73 y.o. female who presents today for her Complete Annual Exam. She feels fairly well. She reports exercising regularly. She reports she is sleeping well. She denies breast issues.  Mammogram  05/2018 - scheduled for November in [place] Colonoscopy  01/2018 DEXA  08/2018 - osteopenia Immunizations - up to date  Hypertension Pertinent negatives include no chest pain, headaches, palpitations or shortness of breath.  Diabetes She presents for her follow-up diabetic visit. She has type 2 diabetes mellitus. The initial diagnosis of diabetes was made 6 months ago. Her disease course has been improving. There are no hypoglycemic associated symptoms. Pertinent negatives for hypoglycemia include no dizziness, headaches, nervousness/anxiousness or tremors. Pertinent negatives for diabetes include no chest pain, no fatigue, no polydipsia and no polyuria. Symptoms are stable. Current diabetic treatment includes oral agent (monotherapy) (metformin started last visit but it caused extreme constipation). She monitors blood glucose at home 1-2 x per day. Her breakfast blood glucose is taken between 6-7 am. Her breakfast blood glucose range is generally 110-130 mg/dl.  Hyperlipidemia Pertinent negatives include no chest pain or shortness of breath.   Lab Results  Component Value Date   HGBA1C 6.8 (H) 12/26/2018   Lab Results  Component Value Date   CREATININE 1.00 06/27/2018   BUN 15 06/27/2018   NA 138 06/27/2018   K 4.4 06/27/2018   CL 101 06/27/2018   CO2 21 06/27/2018   Lab Results  Component Value Date   CHOL 246 (H) 06/27/2018   HDL 88 06/27/2018   LDLCALC 137 (H) 06/27/2018   TRIG 107 06/27/2018   CHOLHDL 2.8 06/27/2018     Review of Systems  Constitutional: Negative for chills, fatigue and fever.  HENT: Negative for congestion, hearing  loss, tinnitus, trouble swallowing and voice change.   Eyes: Positive for visual disturbance (cataracts worsening).  Respiratory: Negative for cough, chest tightness, shortness of breath and wheezing.   Cardiovascular: Negative for chest pain, palpitations and leg swelling.  Gastrointestinal: Positive for constipation. Negative for abdominal pain, diarrhea and vomiting.  Endocrine: Negative for polydipsia and polyuria.  Genitourinary: Negative for dysuria, frequency, genital sores, vaginal bleeding and vaginal discharge.  Musculoskeletal: Positive for arthralgias (intermittent knee pain). Negative for gait problem and joint swelling.  Skin: Negative for color change and rash.  Allergic/Immunologic: Negative for environmental allergies.  Neurological: Negative for dizziness, tremors, light-headedness and headaches.  Hematological: Negative for adenopathy. Does not bruise/bleed easily.  Psychiatric/Behavioral: Negative for dysphoric mood and sleep disturbance. The patient is not nervous/anxious.     Patient Active Problem List   Diagnosis Date Noted  . Carpal tunnel syndrome of right wrist 12/26/2018  . Prediabetes 12/24/2017  . Knee instability, left 12/24/2017  . History of colonic polyps 12/16/2016  . Essential hypertension 12/11/2016  . Mixed hyperlipidemia 12/11/2016  . Gastroesophageal reflux disease 12/11/2016  . Kidney stones 12/11/2016  . Osteopenia 12/11/2016  . Multinodular goiter 06/09/2016    Allergies  Allergen Reactions  . Esomeprazole Magnesium Other (See Comments)    Other Reaction: GI UPSET  . Rosuvastatin Other (See Comments)    Joint pain    Past Surgical History:  Procedure Laterality Date  . ABDOMINAL HYSTERECTOMY     Partial - Still have ovaries. - Cyst on uterus   . COLONOSCOPY  02/04/2018   one  polyp -   . TONSILLECTOMY      Social History   Tobacco Use  . Smoking status: Never Smoker  . Smokeless tobacco: Never Used  Substance Use Topics  .  Alcohol use: No  . Drug use: No     Medication list has been reviewed and updated.  Current Meds  Medication Sig  . aspirin EC 81 MG tablet Take 81 mg by mouth daily.  . cholecalciferol (VITAMIN D) 400 units TABS tablet Take 2,000 Units by mouth 2 (two) times daily.  Marland Kitchen docusate sodium (COLACE) 100 MG capsule Take 100 mg by mouth 2 (two) times daily.  Marland Kitchen glucose blood (ONE TOUCH ULTRA TEST) test strip USE AS DIRECTED ONCE DAILY  . latanoprost (XALATAN) 0.005 % ophthalmic solution Place 1 drop into both eyes at bedtime.  Marland Kitchen lisinopril (ZESTRIL) 10 MG tablet TAKE 2 TABLETS (20 MG) BY MOUTH EVERY DAY  . metFORMIN (GLUCOPHAGE-XR) 500 MG 24 hr tablet Take 1 tablet (500 mg total) by mouth daily with breakfast.  . Omega-3 Fatty Acids (FISH OIL) 1200 MG CAPS Take 1,200 mg by mouth 1 day or 1 dose.  Glory Rosebush Delica Lancets 99991111 MISC USE AS DIRECTED ONCE DAILY  . Plant Sterols and Stanols (CHOLESTOFF PLUS PO) Take by mouth.  . vitamin B-12 (CYANOCOBALAMIN) 1000 MCG tablet Take 5,000 mcg by mouth daily.    PHQ 2/9 Scores 12/26/2018 06/29/2018 06/27/2018 06/23/2017  PHQ - 2 Score 0 1 1 0    BP Readings from Last 3 Encounters:  05/11/19 118/78  12/26/18 118/80  06/29/18 132/74    Physical Exam Vitals signs and nursing note reviewed.  Constitutional:      General: She is not in acute distress.    Appearance: She is well-developed.  HENT:     Head: Normocephalic and atraumatic.     Right Ear: Tympanic membrane and ear canal normal.     Left Ear: Tympanic membrane and ear canal normal.     Nose:     Right Sinus: No maxillary sinus tenderness.     Left Sinus: No maxillary sinus tenderness.  Eyes:     General: No scleral icterus.       Right eye: No discharge.        Left eye: No discharge.     Conjunctiva/sclera: Conjunctivae normal.  Neck:     Musculoskeletal: Normal range of motion. No erythema.     Thyroid: No thyromegaly.     Vascular: No carotid bruit.  Cardiovascular:     Rate  and Rhythm: Normal rate and regular rhythm.     Pulses: Normal pulses.     Heart sounds: Normal heart sounds.  Pulmonary:     Effort: Pulmonary effort is normal. No respiratory distress.     Breath sounds: No wheezing.  Chest:     Breasts:        Right: No mass, nipple discharge, skin change or tenderness.        Left: No mass, nipple discharge, skin change or tenderness.  Abdominal:     General: Bowel sounds are normal.     Palpations: Abdomen is soft.     Tenderness: There is no abdominal tenderness.  Musculoskeletal: Normal range of motion.     Right lower leg: No edema.     Left lower leg: No edema.  Lymphadenopathy:     Cervical: No cervical adenopathy.  Skin:    General: Skin is warm and dry.     Capillary Refill:  Capillary refill takes less than 2 seconds.     Findings: No rash.  Neurological:     General: No focal deficit present.     Mental Status: She is alert and oriented to person, place, and time.     Cranial Nerves: No cranial nerve deficit.     Sensory: No sensory deficit.     Deep Tendon Reflexes: Reflexes are normal and symmetric.  Psychiatric:        Speech: Speech normal.        Behavior: Behavior normal.        Thought Content: Thought content normal.     Wt Readings from Last 3 Encounters:  05/11/19 181 lb (82.1 kg)  12/26/18 185 lb 9.6 oz (84.2 kg)  06/29/18 184 lb (83.5 kg)    BP 118/78   Pulse 67   Ht 5\' 3"  (1.6 m)   Wt 181 lb (82.1 kg)   SpO2 98%   BMI 32.06 kg/m   Assessment and Plan: 1. Annual physical exam Normal exam except for weight which is down 4 lbs Continue exercise and healthy diet - POCT urinalysis dipstick  2. Encounter for screening mammogram for breast cancer Scheduled at [place]; Future  3. Essential hypertension Clinically stable exam with well controlled BP.   Tolerating medications, lisinopril 10 mg, without side effects at this time. Pt to continue current regimen and  low sodium diet; benefits of regular exercise as able discussed. - CBC with Differential/Platelet  4. Gastroesophageal reflux disease without esophagitis No recurrent symptoms. No red flag signs such as weight loss, n/v, melena Will continue to monitor on no daily medications. - CBC with Differential/Platelet  5. Type II diabetes mellitus with complication (HCC) Clinically stable by exam and report without s/s of hypoglycemia. DM complicated by HTN. Not tolerating metformin ER 500 mg daily due to constipation. Will hold medications at this time, assess A1C and if improved, will continue lifestyle changes only until next visit - Comprehensive metabolic panel - Hemoglobin A1c  6. Mixed hyperlipidemia Pt did not tolerate crestor; unwilling at this time to try another medication Continue diet, exercise and herbal supplements (cholestoff) - Lipid panel  7. Multinodular goiter Scattered small nodules on [place] - no indication for FNA or follow up No s/s related to thyroid disease - TSH + free T4   Partially dictated using Editor, commissioning. Any errors are unintentional.  Halina Maidens, MD [facility]  05/11/2019

## 2019-05-11 NOTE — Patient Instructions (Signed)
Stop metformin for now.  Continue to work on diet and exercise.

## 2019-05-12 LAB — CBC WITH DIFFERENTIAL/PLATELET
Basophils Absolute: 0 10*3/uL (ref 0.0–0.2)
Basos: 1 %
EOS (ABSOLUTE): 0.2 10*3/uL (ref 0.0–0.4)
Eos: 3 %
Hematocrit: 37.1 % (ref 34.0–46.6)
Hemoglobin: 12.2 g/dL (ref 11.1–15.9)
Immature Grans (Abs): 0 10*3/uL (ref 0.0–0.1)
Immature Granulocytes: 0 %
Lymphocytes Absolute: 2.8 10*3/uL (ref 0.7–3.1)
Lymphs: 51 %
MCH: 29.3 pg (ref 26.6–33.0)
MCHC: 32.9 g/dL (ref 31.5–35.7)
MCV: 89 fL (ref 79–97)
Monocytes Absolute: 0.5 10*3/uL (ref 0.1–0.9)
Monocytes: 8 %
Neutrophils Absolute: 2.1 10*3/uL (ref 1.4–7.0)
Neutrophils: 37 %
Platelets: 283 10*3/uL (ref 150–450)
RBC: 4.16 x10E6/uL (ref 3.77–5.28)
RDW: 13 % (ref 11.7–15.4)
WBC: 5.6 10*3/uL (ref 3.4–10.8)

## 2019-05-12 LAB — COMPREHENSIVE METABOLIC PANEL
ALT: 17 IU/L (ref 0–32)
AST: 18 IU/L (ref 0–40)
Albumin/Globulin Ratio: 1.5 (ref 1.2–2.2)
Albumin: 4.6 g/dL (ref 3.7–4.7)
Alkaline Phosphatase: 77 IU/L (ref 39–117)
BUN/Creatinine Ratio: 18 (ref 12–28)
BUN: 17 mg/dL (ref 8–27)
Bilirubin Total: 0.2 mg/dL (ref 0.0–1.2)
CO2: 23 mmol/L (ref 20–29)
Calcium: 10.2 mg/dL (ref 8.7–10.3)
Chloride: 102 mmol/L (ref 96–106)
Creatinine, Ser: 0.95 mg/dL (ref 0.57–1.00)
GFR calc Af Amer: 68 mL/min/{1.73_m2} (ref >59)
GFR calc non Af Amer: 59 mL/min/{1.73_m2} — ABNORMAL LOW (ref >59)
Globulin, Total: 3 g/dL (ref 1.5–4.5)
Glucose: 99 mg/dL (ref 65–99)
Potassium: 4.4 mmol/L (ref 3.5–5.2)
Sodium: 136 mmol/L (ref 134–144)
Total Protein: 7.6 g/dL (ref 6.0–8.5)

## 2019-05-12 LAB — LIPID PANEL
Chol/HDL Ratio: 2.6 ratio (ref 0.0–4.4)
Cholesterol, Total: 226 mg/dL — ABNORMAL HIGH (ref 100–199)
HDL: 88 mg/dL (ref >39)
LDL Chol Calc (NIH): 122 mg/dL — ABNORMAL HIGH (ref 0–99)
Triglycerides: 95 mg/dL (ref 0–149)
VLDL Cholesterol Cal: 16 mg/dL (ref 5–40)

## 2019-05-12 LAB — HEMOGLOBIN A1C
Est. average glucose Bld gHb Est-mCnc: 123 mg/dL
Hgb A1c MFr Bld: 5.9 % — ABNORMAL HIGH (ref 4.8–5.6)

## 2019-05-12 LAB — TSH+FREE T4
Free T4: 1.13 ng/dL (ref 0.82–1.77)
TSH: 1.51 u[IU]/mL (ref 0.450–4.500)

## 2019-06-14 DIAGNOSIS — Z1231 Encounter for screening mammogram for malignant neoplasm of breast: Secondary | ICD-10-CM | POA: Diagnosis not present

## 2019-06-23 ENCOUNTER — Encounter: Payer: Medicare Other | Admitting: Internal Medicine

## 2019-06-23 ENCOUNTER — Other Ambulatory Visit: Payer: Self-pay | Admitting: Internal Medicine

## 2019-06-23 DIAGNOSIS — R7303 Prediabetes: Secondary | ICD-10-CM

## 2019-06-30 ENCOUNTER — Encounter: Payer: Medicare Other | Admitting: Internal Medicine

## 2019-07-04 ENCOUNTER — Telehealth: Payer: Self-pay

## 2019-07-04 NOTE — Telephone Encounter (Signed)
[facility] called saying pt mammogram from 11/4 shows Lt breast calcification that measures as a 55mm cluster. Indeterminate but probably benign. Recommend stereotactic biopsy. They will schedule and do the procedure and will send a faxed order over for our office to sign.  Benedict Needy, CMA

## 2019-07-10 ENCOUNTER — Ambulatory Visit (INDEPENDENT_AMBULATORY_CARE_PROVIDER_SITE_OTHER): Payer: Medicare Other

## 2019-07-10 ENCOUNTER — Other Ambulatory Visit: Payer: Self-pay

## 2019-07-10 VITALS — BP 122/74 | HR 72 | Resp 16 | Ht 63.0 in | Wt 179.4 lb

## 2019-07-10 DIAGNOSIS — Z0000 Encounter for general adult medical examination without abnormal findings: Secondary | ICD-10-CM

## 2019-07-10 NOTE — Patient Instructions (Signed)
Ms. Fancy , Thank you for taking time to come for your Medicare Wellness Visit. I appreciate your ongoing commitment to your health goals. Please review the following plan we discussed and let me know if I can assist you in the future.   Screening recommendations/referrals: Colonoscopy: done 02/04/18 Mammogram: done 07/04/19 Bone Density: done 09/06/18 Recommended yearly ophthalmology/optometry visit for glaucoma screening and checkup Recommended yearly dental visit for hygiene and checkup  Vaccinations: Influenza vaccine: done 03/31/19 Pneumococcal vaccine: done 11/06/13 Tdap vaccine: done 2011 Shingles vaccine: Shingrix series completed.     Advanced directives: Please bring a copy of your health care power of attorney and living will to the office at your convenience.  Conditions/risks identified: Recommend monitoring saturated fat in diet and continuing exercise to lower cholesterol.   Next appointment: Please follow up in one year for your Medicare Annual Wellness visit.     Preventive Care 45 Years and Older, Female Preventive care refers to lifestyle choices and visits with your health care provider that can promote health and wellness. What does preventive care include?  A yearly physical exam. This is also called an annual well check.  Dental exams once or twice a year.  Routine eye exams. Ask your health care provider how often you should have your eyes checked.  Personal lifestyle choices, including:  Daily care of your teeth and gums.  Regular physical activity.  Eating a healthy diet.  Avoiding tobacco and drug use.  Limiting alcohol use.  Practicing safe sex.  Taking low-dose aspirin every day.  Taking vitamin and mineral supplements as recommended by your health care provider. What happens during an annual well check? The services and screenings done by your health care provider during your annual well check will depend on your age, overall health,  lifestyle risk factors, and family history of disease. Counseling  Your health care provider may ask you questions about your:  Alcohol use.  Tobacco use.  Drug use.  Emotional well-being.  Home and relationship well-being.  Sexual activity.  Eating habits.  History of falls.  Memory and ability to understand (cognition).  Work and work Statistician.  Reproductive health. Screening  You may have the following tests or measurements:  Height, weight, and BMI.  Blood pressure.  Lipid and cholesterol levels. These may be checked every 5 years, or more frequently if you are over 61 years old.  Skin check.  Lung cancer screening. You may have this screening every year starting at age 87 if you have a 30-pack-year history of smoking and currently smoke or have quit within the past 15 years.  Fecal occult blood test (FOBT) of the stool. You may have this test every year starting at age 12.  Flexible sigmoidoscopy or colonoscopy. You may have a sigmoidoscopy every 5 years or a colonoscopy every 10 years starting at age 54.  Hepatitis C blood test.  Hepatitis B blood test.  Sexually transmitted disease (STD) testing.  Diabetes screening. This is done by checking your blood sugar (glucose) after you have not eaten for a while (fasting). You may have this done every 1-3 years.  Bone density scan. This is done to screen for osteoporosis. You may have this done starting at age 58.  Mammogram. This may be done every 1-2 years. Talk to your health care provider about how often you should have regular mammograms. Talk with your health care provider about your test results, treatment options, and if necessary, the need for more tests. Vaccines  Your health care provider may recommend certain vaccines, such as:  Influenza vaccine. This is recommended every year.  Tetanus, diphtheria, and acellular pertussis (Tdap, Td) vaccine. You may need a Td booster every 10 years.  Zoster  vaccine. You may need this after age 66.  Pneumococcal 13-valent conjugate (PCV13) vaccine. One dose is recommended after age 21.  Pneumococcal polysaccharide (PPSV23) vaccine. One dose is recommended after age 70. Talk to your health care provider about which screenings and vaccines you need and how often you need them. This information is not intended to replace advice given to you by your health care provider. Make sure you discuss any questions you have with your health care provider. Document Released: 08/23/2015 Document Revised: 04/15/2016 Document Reviewed: 05/28/2015 Elsevier Interactive Patient Education  2017 [employer] Prevention in the Home Falls can cause injuries. They can happen to people of all ages. There are many things you can do to make your home safe and to help prevent falls. What can I do on the outside of my home?  Regularly fix the edges of walkways and driveways and fix any cracks.  Remove anything that might make you trip as you walk through a door, such as a raised step or threshold.  Trim any bushes or trees on the path to your home.  Use bright outdoor lighting.  Clear any walking paths of anything that might make someone trip, such as rocks or tools.  Regularly check to see if handrails are loose or broken. Make sure that both sides of any steps have handrails.  Any raised decks and porches should have guardrails on the edges.  Have any leaves, snow, or ice cleared regularly.  Use sand or salt on walking paths during winter.  Clean up any spills in your garage right away. This includes oil or grease spills. What can I do in the bathroom?  Use night lights.  Install grab bars by the toilet and in the tub and shower. Do not use towel bars as grab bars.  Use non-skid mats or decals in the tub or shower.  If you need to sit down in the shower, use a plastic, non-slip stool.  Keep the floor dry. Clean up any water that spills on the  floor as soon as it happens.  Remove soap buildup in the tub or shower regularly.  Attach bath mats securely with double-sided non-slip rug tape.  Do not have throw rugs and other things on the floor that can make you trip. What can I do in the bedroom?  Use night lights.  Make sure that you have a light by your bed that is easy to reach.  Do not use any sheets or blankets that are too big for your bed. They should not hang down onto the floor.  Have a firm chair that has side arms. You can use this for support while you get dressed.  Do not have throw rugs and other things on the floor that can make you trip. What can I do in the kitchen?  Clean up any spills right away.  Avoid walking on wet floors.  Keep items that you use a lot in easy-to-reach places.  If you need to reach something above you, use a strong step stool that has a grab bar.  Keep electrical cords out of the way.  Do not use floor polish or wax that makes floors slippery. If you must use wax, use non-skid floor wax.  Do  not have throw rugs and other things on the floor that can make you trip. What can I do with my stairs?  Do not leave any items on the stairs.  Make sure that there are handrails on both sides of the stairs and use them. Fix handrails that are broken or loose. Make sure that handrails are as long as the stairways.  Check any carpeting to make sure that it is firmly attached to the stairs. Fix any carpet that is loose or worn.  Avoid having throw rugs at the top or bottom of the stairs. If you do have throw rugs, attach them to the floor with carpet tape.  Make sure that you have a light switch at the top of the stairs and the bottom of the stairs. If you do not have them, ask someone to add them for you. What else can I do to help prevent falls?  Wear shoes that:  Do not have high heels.  Have rubber bottoms.  Are comfortable and fit you well.  Are closed at the toe. Do not wear  sandals.  If you use a stepladder:  Make sure that it is fully opened. Do not climb a closed stepladder.  Make sure that both sides of the stepladder are locked into place.  Ask someone to hold it for you, if possible.  Clearly mark and make sure that you can see:  Any grab bars or handrails.  First and last steps.  Where the edge of each step is.  Use tools that help you move around (mobility aids) if they are needed. These include:  Canes.  Walkers.  Scooters.  Crutches.  Turn on the lights when you go into a dark area. Replace any light bulbs as soon as they burn out.  Set up your furniture so you have a clear path. Avoid moving your furniture around.  If any of your floors are uneven, fix them.  If there are any pets around you, be aware of where they are.  Review your medicines with your doctor. Some medicines can make you feel dizzy. This can increase your chance of falling. Ask your doctor what other things that you can do to help prevent falls. This information is not intended to replace advice given to you by your health care provider. Make sure you discuss any questions you have with your health care provider. Document Released: 05/23/2009 Document Revised: 01/02/2016 Document Reviewed: 08/31/2014 Elsevier Interactive Patient Education  2017 [employer].

## 2019-07-10 NOTE — Progress Notes (Signed)
Subjective:   Dawn Fields is a 73 y.o. female who presents for Medicare Annual (Subsequent) preventive examination.  Review of Systems:   Cardiac Risk Factors include: advanced age (>60men, >52 women);diabetes mellitus;dyslipidemia;hypertension;obesity (BMI >30kg/m2)     Objective:     Vitals: BP 122/74 (BP Location: Left Arm, Patient Position: Sitting, Cuff Size: Normal)   Pulse 72   Resp 16   Ht 5\' 3"  (1.6 m)   Wt 179 lb 6.4 oz (81.4 kg)   SpO2 98%   BMI 31.78 kg/m   Body mass index is 31.78 kg/m.  Advanced Directives 07/10/2019 06/29/2018 12/16/2016 05/20/2016  Does Patient Have a Medical Advance Directive? Yes Yes Yes No  Type of Paramedic of [place];Living will [place];Living will Living will -  Does patient want to make changes to medical advance directive? No - Patient declined - - -  Copy of [place] in Chart? No - copy requested No - copy requested - -  Would patient like information on creating a medical advance directive? - - - No - patient declined information    Tobacco Social History   Tobacco Use  Smoking Status Never Smoker  Smokeless Tobacco Never Used     Counseling given: Not Answered   Clinical Intake:  Pre-visit preparation completed: Yes  Pain : No/denies pain     Nutritional Status: BMI > 30  Obese Nutritional Risks: None Diabetes: Yes CBG done?: No Did pt. bring in CBG monitor from home?: No  How often do you need to have someone help you when you read instructions, pamphlets, or other written materials from your doctor or pharmacy?: 1 - Never  Interpreter Needed?: No  Information entered by :: Clemetine Marker LPN  Past Medical History:  Diagnosis Date  . Allergy Pollen  . Carpal tunnel syndrome of right wrist   . Cataract Surgery Pending  . GERD (gastroesophageal reflux disease)   . Hyperlipidemia   . Hypertension   . Kidney stones   . Multinodular goiter   .  Type II diabetes mellitus with complication [facility])    Past Surgical History:  Procedure Laterality Date  . ABDOMINAL HYSTERECTOMY     Partial - Still have ovaries. - Cyst on uterus   . COLONOSCOPY  02/04/2018   one polyp -   . TONSILLECTOMY     Family History  Problem Relation Age of Onset  . Stroke Mother   . Hypertension Mother   . Liver cancer Father   . COPD Sister   . Diabetes Sister   . Diabetes Brother   . Stroke Brother   . Hearing loss Brother   . Diabetes Maternal Grandmother   . Diabetes Maternal Grandfather   . Diabetes Sister   . Mental illness Sister    Social History   Socioeconomic History  . Marital status: Single    Spouse name: Not on file  . Number of children: 1  . Years of education: Not on file  . Highest education level: Associate degree: academic program  Occupational History  . Occupation: retired    Comment: Theatre manager  Social Needs  . Financial resource strain: Not hard at all  . Food insecurity    Worry: Never true    Inability: Never true  . Transportation needs    Medical: No    Non-medical: No  Tobacco Use  . Smoking status: Never Smoker  . Smokeless tobacco: Never Used  Substance and Sexual  Activity  . Alcohol use: No  . Drug use: No  . Sexual activity: Not Currently    Birth control/protection: Other-see comments    Comment: Age  Lifestyle  . Physical activity    Days per week: 4 days    Minutes per session: 60 min  . Stress: Only a little  Relationships  . Social connections    Talks on phone: More than three times a week    Gets together: Once a week    Attends religious service: Never    Active member of club or organization: No    Attends meetings of clubs or organizations: Never    Relationship status: Never married  Other Topics Concern  . Not on file  Social History Narrative  . Not on file    Outpatient Encounter Medications as of 07/10/2019  Medication Sig  . aspirin EC 81 MG tablet Take 81  mg by mouth daily.  . cholecalciferol (VITAMIN D) 400 units TABS tablet Take 2,000 Units by mouth daily.   Marland Kitchen docusate sodium (COLACE) 100 MG capsule Take 100 mg by mouth 2 (two) times daily.  Marland Kitchen glucose blood (ONE TOUCH ULTRA TEST) test strip USE AS DIRECTED ONCE DAILY  . latanoprost (XALATAN) 0.005 % ophthalmic solution Place 1 drop into both eyes at bedtime.  Marland Kitchen lisinopril (ZESTRIL) 10 MG tablet TAKE 2 TABLETS (20 MG) BY MOUTH EVERY DAY  . Omega-3 Fatty Acids (FISH OIL) 1200 MG CAPS Take 1,200 mg by mouth 1 day or 1 dose.  Glory Rosebush Delica Lancets 99991111 MISC USE AS DIRECTED ONCE DAILY  . Plant Sterols and Stanols (CHOLESTOFF PLUS PO) Take by mouth.  . vitamin B-12 (CYANOCOBALAMIN) 1000 MCG tablet Take 5,000 mcg by mouth daily.   No facility-administered encounter medications on file as of 07/10/2019.     Activities of Daily Living In your present state of health, do you have any difficulty performing the following activities: 07/10/2019  Hearing? N  Comment declines hearing aids  Vision? N  Difficulty concentrating or making decisions? N  Walking or climbing stairs? N  Dressing or bathing? N  Doing errands, shopping? N  Preparing Food and eating ? N  Using the Toilet? N  In the past six months, have you accidently leaked urine? N  Do you have problems with loss of bowel control? N  Managing your Medications? N  Managing your Finances? N  Housekeeping or managing your Housekeeping? N  Some recent data might be hidden    Patient Care Team: Glean Hess, MD as PCP - General (Internal Medicine) Margaretha Sheffield, MD (Otolaryngology) Luster Landsberg, MD as Referring Physician (Gastroenterology) Stanford Breed, MD as Referring Physician (Cardiology)    Assessment:   This is a routine wellness examination for [name].  Exercise Activities and Dietary recommendations Current Exercise Habits: Home exercise routine, Type of exercise: walking, Time (Minutes): 60, Frequency  (Times/Week): 4, Weekly Exercise (Minutes/Week): 240, Intensity: Moderate, Exercise limited by: None identified  Goals    . DIET - REDUCE FAT INTAKE     Patient would like to lower overall cholesterol with diet and exercise.     . Weight (lb) < 200 lb (90.7 kg)     Pt would like to set a goal for 5% weight loss. Continue exercise and healthy eating habits.        Fall Risk Fall Risk  07/10/2019 12/26/2018 06/29/2018 06/27/2018 06/23/2017  Falls in the past year? 0 0 0 0 No  Number falls  in past yr: 0 0 0 0 -  Injury with Fall? 0 0 - 0 -  Risk for fall due to : - - - History of fall(s) -  Follow up Falls prevention discussed Falls evaluation completed - Falls evaluation completed -   FALL RISK PREVENTION PERTAINING TO THE HOME:  Any stairs in or around the home? Yes  If so, do they handrails? Yes   Home free of loose throw rugs in walkways, pet beds, electrical cords, etc? Yes  Adequate lighting in your home to reduce risk of falls? Yes   ASSISTIVE DEVICES UTILIZED TO PREVENT FALLS:  Life alert? No  Use of a cane, walker or w/c? No  Grab bars in the bathroom? No  Shower chair or bench in shower? No  Elevated toilet seat or a handicapped toilet? Yes  DME ORDERS:  DME order needed?  No   TIMED UP AND GO:  Was the test performed? Yes .  Length of time to ambulate 10 feet: 5 sec.   GAIT:  Appearance of gait: Gait stead-fast and without the use of an assistive device.   Education: Fall risk prevention has been discussed.  Intervention(s) required? No    Depression Screen PHQ 2/9 Scores 07/10/2019 12/26/2018 06/29/2018 06/27/2018  PHQ - 2 Score 0 0 1 1     Cognitive Function     6CIT Screen 07/10/2019 06/23/2017  What Year? 0 points 0 points  What month? 0 points 0 points  What time? 0 points 0 points  Count back from 20 0 points 0 points  Months in reverse 0 points 0 points  Repeat phrase 0 points 0 points  Total Score 0 0    Immunization History   Administered Date(s) Administered  . Influenza, High Dose Seasonal PF 05/09/2018, 03/29/2019  . Influenza, Seasonal, Injecte, Preservative Fre 05/09/2014  . Influenza,inj,Quad PF,6+ Mos 06/02/2016  . Influenza-Unspecified 04/08/2017, 04/30/2018, 03/31/2019  . Pneumococcal Conjugate-13 11/06/2013  . Pneumococcal Polysaccharide-23 08/10/2010  . Tdap 03/07/2010  . Zoster 11/02/2012  . Zoster Recombinat (Shingrix) 05/24/2018, 07/25/2018    Qualifies for Shingles Vaccine? Yes  Shingrix series completed.  Tdap: Up to date  Flu Vaccine: Up to date  Pneumococcal Vaccine: Up to date   Screening Tests Health Maintenance  Topic Date Due  . HEMOGLOBIN A1C  11/09/2019  . OPHTHALMOLOGY EXAM  01/13/2020  . TETANUS/TDAP  03/07/2020  . FOOT EXAM  05/10/2020  . MAMMOGRAM  07/03/2020  . COLONOSCOPY  02/05/2028  . INFLUENZA VACCINE  Completed  . DEXA SCAN  Completed  . Hepatitis C Screening  Completed  . PNA vac Low Risk Adult  Completed    Cancer Screenings:  Colorectal Screening: Completed 02/04/18. Repeat every 10 years  Mammogram: Completed 07/04/19. Repeat every year.   Bone Density: Completed 09/06/18. Results reflect OSTEOPENIA. Repeat every 2 years.   Lung Cancer Screening: (Low Dose CT Chest recommended if Age 72-80 years, 30 pack-year currently smoking OR have quit w/in 15years.) does not qualify.    Additional Screening:  Hepatitis C Screening: does qualify; Completed 12/04/13  Vision Screening: Recommended annual ophthalmology exams for early detection of glaucoma and other disorders of the eye. Is the patient up to date with their annual eye exam?  Yes  Who is the provider or what is the name of the office in which the pt attends annual eye exams? Dr. Matilde Sprang  Dental Screening: Recommended annual dental exams for proper oral hygiene  Community Resource Referral:  CRR required this  visit?  No      Plan:      I have personally reviewed and addressed the Medicare  Annual Wellness questionnaire and have noted the following in the patient's chart:  A. Medical and social history B. Use of alcohol, tobacco or illicit drugs  C. Current medications and supplements D. Functional ability and status E.  Nutritional status F.  Physical activity G. Advance directives H. List of other physicians I.  Hospitalizations, surgeries, and ER visits in previous 12 months J.  [place] such as hearing and vision if needed, cognitive and depression L. Referrals and appointments   In addition, I have reviewed and discussed with patient certain preventive protocols, quality metrics, and best practice recommendations. A written personalized care plan for preventive services as well as general preventive health recommendations were provided to patient.   Signed,  Clemetine Marker, LPN Nurse Health Advisor   Nurse Notes: pt has upcoming breast biopsy due to abnormal mammogram but she is overall doing well and appreciative of visit today

## 2019-07-20 HISTORY — PX: BREAST BIOPSY: SHX20

## 2019-07-21 ENCOUNTER — Telehealth: Payer: Self-pay

## 2019-07-21 NOTE — Telephone Encounter (Signed)
Radiologist from [facility] called stating patient is going to need a surgical referral from [place]. She is going to fax over patient breast biopsy report. It came back benign but she will need surgery.   She said the pt has not been informed of results yet. She tried to call the patient several times and left her a message but has not gotten a callback.  FYI. Awaiting Biopsy report before ordering Surgical Referral to get proper diagnosis.

## 2019-07-24 ENCOUNTER — Other Ambulatory Visit: Payer: Self-pay | Admitting: Internal Medicine

## 2019-07-24 ENCOUNTER — Telehealth: Payer: Self-pay | Admitting: Internal Medicine

## 2019-07-24 DIAGNOSIS — R897 Abnormal histological findings in specimens from other organs, systems and tissues: Secondary | ICD-10-CM

## 2019-07-24 NOTE — Telephone Encounter (Signed)
Spoke to patient by phone regarding her breast biopsy results.  She is agreeable to seeing General Surgery to discuss options for excisional biopsy.

## 2019-07-25 ENCOUNTER — Other Ambulatory Visit: Payer: Self-pay | Admitting: Internal Medicine

## 2019-07-28 ENCOUNTER — Other Ambulatory Visit: Payer: Self-pay

## 2019-07-28 NOTE — Patient Outreach (Signed)
[facility]) Care Management  07/28/2019  Dawn Fields 06/16/1946 [id number]   Medication Adherence call to Dawn Fields [facility] Identifiers Verify spoke with patient she is past due on Metformin Er 500 mg,patient explain she is no longer taking this medication doctor stop the medication because her A1c was getting too low. Dawn Fields is showing past due under [name].   [facility] Direct Dial [phone]  Fax [phone] [email or web address]

## 2019-08-08 ENCOUNTER — Ambulatory Visit: Payer: Medicare Other | Admitting: Surgery

## 2019-08-08 ENCOUNTER — Encounter: Payer: Self-pay | Admitting: Surgery

## 2019-08-08 ENCOUNTER — Other Ambulatory Visit: Payer: Self-pay

## 2019-08-08 ENCOUNTER — Telehealth: Payer: Self-pay | Admitting: Emergency Medicine

## 2019-08-08 ENCOUNTER — Other Ambulatory Visit: Payer: Self-pay | Admitting: Surgery

## 2019-08-08 VITALS — BP 126/75 | HR 70 | Temp 97.0°F | Resp 12 | Ht 63.0 in | Wt 181.0 lb

## 2019-08-08 DIAGNOSIS — N6489 Other specified disorders of breast: Secondary | ICD-10-CM

## 2019-08-08 NOTE — Telephone Encounter (Signed)
Medical Clearance sent to Dr. Army Melia [phone] at this time.

## 2019-08-08 NOTE — Progress Notes (Signed)
08/08/2019  Reason for Visit:  Left breast calcifications  Referring Provider:  Halina Maidens, MD  History of Present Illness: Dawn Fields is a 73 y.o. female presenting for evaluation of left breast calcifications found on routine mammogram and confirmed on diagnostic mammogram.  She had been previously doing well with normal mammograms.  Most recently, she was found to have an area of calcifications extending about 9 mm on the upper outer quadrant of the left breast, middle depth.  She had a core biopsy of the area and pathology resulted in complex sclerosing lesion.  She presents for evaluation and discussion of possible surgery.  Prior to the mammogram, the patient denies having an breast pain, skin changes, palpable masses, nipple changes or drainage.  She does have a family history of breast cancer in her aunt.  Past Medical History: Past Medical History:  Diagnosis Date  . Allergy Pollen  . Carpal tunnel syndrome of right wrist   . Cataract Surgery Pending  . GERD (gastroesophageal reflux disease)   . Hyperlipidemia   . Hypertension   . Kidney stones   . Multinodular goiter   . Type II diabetes mellitus with complication [facility])      Past Surgical History: Past Surgical History:  Procedure Laterality Date  . ABDOMINAL HYSTERECTOMY     Partial - Still have ovaries. - Cyst on uterus   . COLONOSCOPY  02/04/2018   one polyp -   . TONSILLECTOMY      Home Medications: Prior to Admission medications   Medication Sig Start Date End Date Taking? Authorizing Provider  aspirin EC 81 MG tablet Take 81 mg by mouth daily.   Yes [provider]  cholecalciferol (VITAMIN D) 400 units TABS tablet Take 2,000 Units by mouth daily.    Yes [provider]  docusate sodium (COLACE) 100 MG capsule Take 100 mg by mouth 2 (two) times daily.   Yes [provider]  latanoprost (XALATAN) 0.005 % ophthalmic solution Place 1 drop into both eyes at bedtime.   Yes  [provider]  lisinopril (ZESTRIL) 10 MG tablet TAKE 2 TABLETS (20 MG) BY MOUTH EVERY DAY 03/28/19  Yes [provider]  Omega-3 Fatty Acids (FISH OIL) 1200 MG CAPS Take 1,200 mg by mouth 1 day or 1 dose.   Yes [provider]  OneTouch Delica Lancets 99991111 MISC USE AS DIRECTED ONCE DAILY 12/12/18  Yes Glean Hess, MD  [facility] ULTRA test strip USE AS DIRECTED ONCE DAILY 07/25/19  Yes Glean Hess, MD  Plant Sterols and Stanols (CHOLESTOFF PLUS PO) Take by mouth.   Yes [provider]  vitamin B-12 (CYANOCOBALAMIN) 1000 MCG tablet Take 5,000 mcg by mouth daily.   Yes [provider]    Allergies: Allergies  Allergen Reactions  . Esomeprazole Magnesium Other (See Comments)    Other Reaction: GI UPSET  . Rosuvastatin Other (See Comments)    Joint pain    Social History:  reports that she has never smoked. She has never used smokeless tobacco. She reports that she does not drink alcohol or use drugs.   Family History: Family History  Problem Relation Age of Onset  . Stroke Mother   . Hypertension Mother   . Liver cancer Father   . COPD Sister   . Diabetes Sister   . Diabetes Brother   . Stroke Brother   . Hearing loss Brother   . Diabetes Maternal Grandmother   . Diabetes Maternal Grandfather   .  Diabetes Sister   . Mental illness Sister     Review of Systems: Review of Systems  Constitutional: Negative for chills and fever.  HENT: Negative for hearing loss.   Respiratory: Negative for cough and shortness of breath.   Cardiovascular: Negative for chest pain.  Gastrointestinal: Negative for nausea and vomiting.  Genitourinary: Negative for dysuria.  Musculoskeletal: Negative for myalgias.  Skin: Negative for rash.  Neurological: Negative for dizziness.  Psychiatric/Behavioral: Negative for depression.    Physical Exam BP 126/75   Pulse 70   Temp (!) 97 F (36.1 C) (Temporal)   Resp 12   Ht 5\' 3"  (1.6 m)   Wt  82.1 kg   SpO2 97%   BMI 32.06 kg/m  CONSTITUTIONAL: No acute distress HEENT:  Normocephalic, atraumatic, extraocular motion intact. NECK: Trachea is midline, and there is no jugular venous distension.  RESPIRATORY:  Lungs are clear, and breath sounds are equal bilaterally. Normal respiratory effort without pathologic use of accessory muscles. CARDIOVASCULAR: Heart is regular without murmurs, gallops, or rubs. BREAST: Left breast status post core needle biopsy with biopsy site healing well with no evidence of infection or bruising or hematoma.  Otherwise no palpable masses, no skin changes, no nipple changes.  No left axillary or supraclavicular lymphadenopathy.  On the right breast, there are no palpable masses, skin changes, or nipple changes.  There is no right axillary or supraclavicular lymphadenopathy. GI: The abdomen is soft, nondistended, nontender. MUSCULOSKELETAL:  Normal muscle strength and tone in all four extremities.  No peripheral edema or cyanosis. SKIN: Skin turgor is normal. There are no pathologic skin lesions.  NEUROLOGIC:  Motor and sensation is grossly normal.  Cranial nerves are grossly intact. PSYCH:  Alert and oriented to person, place and time. Affect is normal.  Laboratory Analysis: Pathology 07/20/19: --Complex sclerosing lesion  Imaging: Mammogram 07/04/19: --group of amorphous calcifications in the left breast upper outer quadrant, middle depth, 9 mm.  There is no associated mass   Assessment and Plan: This is a 73 y.o. female with a left breast complex sclerosing lesion in the upper outer quadrant.   -Discussed with the patient that overall a complex sclerosing lesion, otherwise known as a radial scar, is a benign finding.  However based on the data, there is possibility although low that this could in the future be upgraded to cancer or DCIS or that on excision this could be upgraded to cancer or DCIS.  In order to be 100% sure that this overall is an  negative for benign finding, the best approach would be to proceed with excision.  However, based on the data, there is also a potential role for watchful waiting and follow-up imaging.  The patient reports that she has been researching this further and doing her homework and she would rather proceed with excision.  I think is completely reasonable and is my recommendation of choice as well. -Discussed with patient the role for a wire localized left breast lumpectomy.  Discussed with her the risks of bleeding, infection, injury to surrounding structures.  Discussed with her that she will proceed to mammography first for wire localization following by the lumpectomy itself.  This will be an outpatient procedure.  Discussed with her that she will have to stop her aspirin for 5 days prior to surgery and she would also have to be Covid tested before as well. -Patient will want to schedule this for 09/14/2019.  She wants to make sure that her son will be able  to, from [place] to be with her after the procedure.  She understands that we will schedule her a follow-up appointment with [place] on the last week of January for an H&P update prior to surgery.  Face-to-face time spent with the patient and care providers was 60 minutes, with more than 50% of the time spent counseling, educating, and coordinating care of the patient.     Melvyn Neth, [facility]

## 2019-08-08 NOTE — Patient Instructions (Addendum)
Our Surgery Scheduler will call you within 24-48 hours to schedule your Surgery.  Please have the [facility] Sheet available when she calls you.   Please STOP taking the Baby Aspirin 5 days prior to the date of your Surgery.   Please see your follow up appointment below.   Lumpectomy  A lumpectomy, sometimes called a partial mastectomy, is surgery to remove a cancerous tumor or mass (the lump) from a breast. It is a form of "breast conserving" or "breast preservation" surgery. This means that the cancerous tissue is removed but the breast remains intact. During a lumpectomy, the portion of the breast that contains the tumor is removed. Some normal tissue around the lump may be taken out to make sure that all of the tumor has been removed. Lymph nodes under your arm may also be removed and tested to find out if the cancer has spread. Lymph nodes are part of the body's disease-fighting system (immune system) and are usually the first place where breast cancer spreads. Tell a health care provider about:  Any allergies you have.  All medicines you are taking, including vitamins, herbs, eye drops, creams, and over-the-counter medicines.  Any problems you or family members have had with anesthetic medicines.  Any blood disorders you have.  Any surgeries you have had.  Any medical conditions you have.  Whether you are pregnant or may be pregnant. What are the risks? Generally, this is a safe procedure. However, problems may occur, including:  Bleeding.  Infection.  Allergic reaction to medicines.  Pain, swelling, weakness, or numbness in the arm on the side of your surgery.  Temporary swelling.  Change in the shape of the breast, particularly if a large portion is removed.  Scar tissue that forms at the surgical site and feels hard to the touch. What happens before the procedure? Staying hydrated Follow instructions from your health care provider about hydration, which may  include:  Up to 2 hours before the procedure - you may continue to drink clear liquids, such as water, clear fruit juice, black coffee, and plain tea.  Eating and drinking restrictions  Follow instructions from your health care provider about eating and drinking, which may include: ? 8 hours before the procedure - stop eating heavy meals or foods such as meat, fried foods, or fatty foods. ? 6 hours before the procedure - stop eating light meals or foods, such as toast or cereal. ? 6 hours before the procedure - stop drinking milk or drinks that contain milk. ? 2 hours before the procedure - stop drinking clear liquids. Medicines  Ask your health care provider about: ? Changing or stopping your regular medicines. This is especially important if you are taking diabetes medicines or blood thinners. ? Taking medicines such as aspirin and ibuprofen. These medicines can thin your blood. Do not take these medicines before your procedure if your health care provider instructs you not to.  You may be given antibiotic medicine to help prevent infection. General instructions  Prior to surgery, your health care provider may do a procedure to locate and mark the tumor area in your breast (localization). This procedure will help guide your surgeon to where the incision will be made. This may be done with: ? Imaging. This may include a mammogram, ultrasound, or MRI. ? Insertion of a small wire, clip, seed, or radar reflector implant.  You may be screened for extra fluid around the lymph nodes (lymphedema).  Ask your health care provider how your  surgical site will be marked or identified.  Plan to have someone take you home from the hospital or clinic. What happens during the procedure?   To lower your risk of infection: ? Your health care team will wash or sanitize their hands. ? Your skin will be washed with soap.  An IV will be inserted into one of your veins.  You will be given one or more  of the following: ? A medicine to help you relax (sedative). ? A medicine to numb the area (local anesthetic). ? A medicine to make you fall asleep (general anesthetic).  Your health care provider will use a kind of electric scalpel that uses heat to minimize bleeding (electrocautery knife). A curved incision that follows the natural curve of your breast will be made. This type of incision will allow for minimal scarring and better healing.  The tumor will be removed along with some of the surrounding tissue. This will be sent to the lab for testing. Your health care provider may also remove lymph nodes at this time if needed.  If the tumor is close to the muscles over your chest, some muscle tissue may also be removed.  A small drain tube may be inserted into your breast area or armpit to collect fluid that may build up after surgery. This tube will be connected to a suction bulb on the outside of your body to remove the fluid.  The incision will be closed with stitches (sutures).  A bandage (dressing) may be placed over the incision. The procedure may vary among health care providers and hospitals. What happens after the procedure?  Your blood pressure, heart rate, breathing rate, and blood oxygen level will be monitored until you leave the hospital or clinic.  You will be given medicine for pain as needed.  Your IV will be removed when you are able to eat and drink by mouth.  You will be encouraged to get up and walk as soon as you can. This is important to improve blood flow and breathing. Ask for help if you feel weak or unsteady.  You may have a drain tube in place for 2-3 days to prevent a collection of blood (hematoma) from developing in the breast. You will be given instructions about caring for the drain before you go home.  A pressure bandage may be applied for 1-2 days to prevent bleeding or swelling. Your pressure bandage may look like a thick piece of fabric or an elastic  wrap. Ask your health care provider how to care for your bandage at home.  You may be given a tight sleeve to wear over your arm on the side of your surgery. You should wear this sleeve as told by your health care provider.  Do not drive for 24 hours if you were given a sedative during your procedure. Summary  A lumpectomy, sometimes called a partial mastectomy, is surgery to remove a cancerous tumor or mass (the lump) from a breast.  During a lumpectomy, the portion of the breast that contains the tumor is removed. Some normal tissue around the lump may be taken out to make sure that all of the tumor has been removed. Lymph nodes under your arm may also be removed and tested to find out if the cancer has spread.  You may have a drain tube in place for 2-3 days to prevent a collection of blood (hematoma) from developing in the breast. You will be given instructions about caring for the  drain before you go home.  Plan to have someone take you home from the hospital or clinic. This information is not intended to replace advice given to you by your health care provider. Make sure you discuss any questions you have with your health care provider. Document Released: 09/07/2006 Document Revised: 01/25/2018 Document Reviewed: 04/08/2016 Elsevier Patient Education  2020 [employer].

## 2019-08-11 HISTORY — PX: OTHER SURGICAL HISTORY: SHX169

## 2019-08-13 ENCOUNTER — Encounter: Payer: Self-pay | Admitting: Internal Medicine

## 2019-08-13 DIAGNOSIS — T466X5A Adverse effect of antihyperlipidemic and antiarteriosclerotic drugs, initial encounter: Secondary | ICD-10-CM | POA: Insufficient documentation

## 2019-08-13 DIAGNOSIS — M791 Myalgia, unspecified site: Secondary | ICD-10-CM | POA: Insufficient documentation

## 2019-08-13 DIAGNOSIS — G720 Drug-induced myopathy: Secondary | ICD-10-CM | POA: Insufficient documentation

## 2019-08-14 ENCOUNTER — Other Ambulatory Visit: Payer: Self-pay

## 2019-08-14 ENCOUNTER — Telehealth: Payer: Self-pay | Admitting: Emergency Medicine

## 2019-08-14 NOTE — Telephone Encounter (Signed)
Medical Clearance received from Dr. Army Melia 08/09/2019.  "Procedure Risk is Low."

## 2019-08-14 NOTE — Patient Outreach (Signed)
[facility]) Care Management  08/14/2019  Dawn Fields 01-01-1946 [id number]   Medication Adherence call to Dawn Fields Telephone call to Patient regarding Medication Adherence unable to reach patient. Patient did not answer she is past due on Metformin Er 500 mg,last month when speaking with patient she explain she is no longer taking Metformin Er 500 mg,patient is showing past due under [place].   [facility] Management Direct Dial [phone]  Fax [phone] [email or web address]

## 2019-08-15 ENCOUNTER — Other Ambulatory Visit: Payer: Self-pay

## 2019-08-15 NOTE — Patient Outreach (Signed)
[facility]) Care Management  08/15/2019  Dawn Fields [birth year] [id number]   Medication Adherence call back from [name] Hippa Identifiers Verify patient is past due on Metformin Er 500 mg,patient explain doctor took her off because her blood sugar is low at this time. [name] is showing past due under [facility] ins.   [facility] Management Direct Dial [phone]  Fax [phone] [email or web address]

## 2019-08-21 ENCOUNTER — Telehealth: Payer: Self-pay | Admitting: *Deleted

## 2019-08-21 NOTE — Telephone Encounter (Signed)
Patient called and saw that her surgery was rescheduled from 09/14/19 to 08/30/19 but she has her son who lives out of state who has to make arrangements to come home to help her so she just wants to know wants going on, please call and advise

## 2019-08-25 DIAGNOSIS — H2513 Age-related nuclear cataract, bilateral: Secondary | ICD-10-CM | POA: Diagnosis not present

## 2019-08-25 DIAGNOSIS — I10 Essential (primary) hypertension: Secondary | ICD-10-CM | POA: Diagnosis not present

## 2019-08-25 DIAGNOSIS — H2511 Age-related nuclear cataract, right eye: Secondary | ICD-10-CM | POA: Diagnosis not present

## 2019-08-25 DIAGNOSIS — H401131 Primary open-angle glaucoma, bilateral, mild stage: Secondary | ICD-10-CM | POA: Diagnosis not present

## 2019-08-28 NOTE — Telephone Encounter (Signed)
I spoke with the patient due to having to r/s her appt, I offered patient surgery dates 09/20/19 or 09/29/19, patient said she wouldn't be able to do either one of those dates, and said she would like to wait for now.

## 2019-08-30 ENCOUNTER — Ambulatory Visit: Admit: 2019-08-30 | Payer: Medicare Other | Admitting: Surgery

## 2019-08-30 ENCOUNTER — Ambulatory Visit: Payer: Medicare Other

## 2019-08-30 SURGERY — BREAST LUMPECTOMY WITH NEEDLE LOCALIZATION
Anesthesia: General | Laterality: Left

## 2019-09-01 ENCOUNTER — Encounter: Payer: Self-pay | Admitting: Internal Medicine

## 2019-09-01 ENCOUNTER — Other Ambulatory Visit: Payer: Self-pay

## 2019-09-01 ENCOUNTER — Ambulatory Visit (INDEPENDENT_AMBULATORY_CARE_PROVIDER_SITE_OTHER): Payer: Medicare Other | Admitting: Internal Medicine

## 2019-09-01 ENCOUNTER — Ambulatory Visit: Payer: Self-pay | Admitting: Surgery

## 2019-09-01 VITALS — BP 136/82 | HR 64 | Temp 97.8°F | Ht 63.0 in | Wt 183.0 lb

## 2019-09-01 DIAGNOSIS — E118 Type 2 diabetes mellitus with unspecified complications: Secondary | ICD-10-CM | POA: Diagnosis not present

## 2019-09-01 DIAGNOSIS — R897 Abnormal histological findings in specimens from other organs, systems and tissues: Secondary | ICD-10-CM | POA: Diagnosis not present

## 2019-09-01 DIAGNOSIS — I10 Essential (primary) hypertension: Secondary | ICD-10-CM

## 2019-09-01 NOTE — Patient Instructions (Signed)
Go ahead and reschedule your breast surgery for as soon as it is practical.

## 2019-09-01 NOTE — Progress Notes (Signed)
Date:  09/01/2019   Name:  Dawn Fields   DOB:  08/29/1945   MRN:  [id number]   Chief Complaint: Hypertension  Hypertension This is a chronic problem. The problem is controlled. Pertinent negatives include no chest pain, headaches, palpitations or shortness of breath. Past treatments include ACE inhibitors. The current treatment provides significant improvement.  Diabetes She presents for her follow-up diabetic visit. She has type 2 diabetes mellitus. Her disease course has been stable. Pertinent negatives for hypoglycemia include no headaches or tremors. Pertinent negatives for diabetes include no chest pain, no fatigue, no polydipsia and no polyuria. Current diabetic treatment includes diet (stopped metformin last visit). She is compliant with treatment most of the time. Her weight is stable. She is following a generally healthy diet. She participates in exercise three times a week. There is no change in her home blood glucose trend. Her breakfast blood glucose is taken between 6-7 am. Her breakfast blood glucose range is generally 90-110 mg/dl. An ACE inhibitor/angiotensin II receptor blocker is being taken.  Breast mass - this has been biopsies and now she needs to undergo a lumectomy for a complex sclerosing lesion.She was ready to go with her son coming from [place] then got rescheduled to a date that he could not do.  She has decided to wait until the end of the year when surgeries are opened up.  She has been told that this is elective.  Lab Results  Component Value Date   CREATININE 0.95 05/11/2019   BUN 17 05/11/2019   NA 136 05/11/2019   K 4.4 05/11/2019   CL 102 05/11/2019   CO2 23 05/11/2019   Lab Results  Component Value Date   CHOL 226 (H) 05/11/2019   HDL 88 05/11/2019   LDLCALC 122 (H) 05/11/2019   TRIG 95 05/11/2019   CHOLHDL 2.6 05/11/2019   Lab Results  Component Value Date   TSH 1.510 05/11/2019   Lab Results  Component Value Date   HGBA1C 5.9 (H) 05/11/2019      Review of Systems  Constitutional: Negative for appetite change, fatigue, fever and unexpected weight change.  HENT: Negative for tinnitus and trouble swallowing.   Eyes: Positive for visual disturbance (cataracts).  Respiratory: Negative for cough, chest tightness and shortness of breath.   Cardiovascular: Negative for chest pain, palpitations and leg swelling.  Gastrointestinal: Negative for abdominal pain.  Endocrine: Negative for polydipsia and polyuria.  Genitourinary: Negative for dysuria and hematuria.  Musculoskeletal: Negative for arthralgias.  Neurological: Negative for tremors, numbness and headaches.  Psychiatric/Behavioral: Negative for dysphoric mood.    Patient Active Problem List   Diagnosis Date Noted  . Myalgia due to statin 08/13/2019  . Cataracts, both eyes 04/12/2019  . Carpal tunnel syndrome of right wrist 12/26/2018  . Type II diabetes mellitus with complication ([place]) 99991111  . Knee instability, left 12/24/2017  . History of colonic polyps 12/16/2016  . Essential hypertension 12/11/2016  . Mixed hyperlipidemia 12/11/2016  . Gastroesophageal reflux disease 12/11/2016  . Kidney stones 12/11/2016  . Osteopenia 12/11/2016  . Multinodular goiter 06/09/2016    Allergies  Allergen Reactions  . Esomeprazole Magnesium Other (See Comments)    Other Reaction: GI UPSET  . Rosuvastatin Other (See Comments)    Joint and muscle pain    Past Surgical History:  Procedure Laterality Date  . ABDOMINAL HYSTERECTOMY     Partial - Still have ovaries. - Cyst on uterus   . COLONOSCOPY  02/04/2018   one  polyp -   . TONSILLECTOMY      Social History   Tobacco Use  . Smoking status: Never Smoker  . Smokeless tobacco: Never Used  Substance Use Topics  . Alcohol use: No  . Drug use: No     Medication list has been reviewed and updated.  Current Meds  Medication Sig  . aspirin EC 81 MG tablet Take 81 mg by mouth daily.  . cholecalciferol (VITAMIN D)  400 units TABS tablet Take 2,000 Units by mouth daily.   Marland Kitchen docusate sodium (COLACE) 100 MG capsule Take 100 mg by mouth 2 (two) times daily.  Marland Kitchen latanoprost (XALATAN) 0.005 % ophthalmic solution Place 1 drop into both eyes at bedtime.  Marland Kitchen lisinopril (ZESTRIL) 10 MG tablet TAKE 2 TABLETS (20 MG) BY MOUTH EVERY DAY  . Omega-3 Fatty Acids (FISH OIL) 1200 MG CAPS Take 1,200 mg by mouth 1 day or 1 dose.  Glory Rosebush Delica Lancets 99991111 MISC USE AS DIRECTED ONCE DAILY  . ONETOUCH ULTRA test strip USE AS DIRECTED ONCE DAILY  . Plant Sterols and Stanols (CHOLESTOFF PLUS PO) Take by mouth.  . vitamin B-12 (CYANOCOBALAMIN) 1000 MCG tablet Take 5,000 mcg by mouth daily.    PHQ 2/9 Scores 09/01/2019 07/10/2019 12/26/2018 06/29/2018  PHQ - 2 Score 0 0 0 1    BP Readings from Last 3 Encounters:  09/01/19 136/82  08/08/19 126/75  07/10/19 122/74    Physical Exam Vitals and nursing note reviewed.  Constitutional:      General: She is not in acute distress.    Appearance: She is well-developed.  HENT:     Head: Normocephalic and atraumatic.  Cardiovascular:     Rate and Rhythm: Normal rate and regular rhythm.     Pulses: Normal pulses.  Pulmonary:     Effort: Pulmonary effort is normal. No respiratory distress.     Breath sounds: No wheezing or rhonchi.  Musculoskeletal:        General: Normal range of motion.     Cervical back: Normal range of motion.     Right lower leg: No edema.     Left lower leg: No edema.  Lymphadenopathy:     Cervical: No cervical adenopathy.  Skin:    General: Skin is warm and dry.     Capillary Refill: Capillary refill takes less than 2 seconds.     Findings: No rash.  Neurological:     General: No focal deficit present.     Mental Status: She is alert and oriented to person, place, and time.  Psychiatric:        Behavior: Behavior normal.        Thought Content: Thought content normal.     Wt Readings from Last 3 Encounters:  09/01/19 183 lb (83 kg)    08/08/19 181 lb (82.1 kg)  07/10/19 179 lb 6.4 oz (81.4 kg)    BP 136/82   Pulse 64   Temp 97.8 F (36.6 C) (Oral)   Ht 5\' 3"  (1.6 m)   Wt 183 lb (83 kg)   SpO2 97%   BMI 32.42 kg/m   Assessment and Plan: 1. Essential hypertension Clinically stable exam with well controlled BP on lisinopril. Tolerating medications without side effects at this time. Pt to continue current regimen and low sodium diet; benefits of regular exercise as able discussed.  2. Type II diabetes mellitus with complication ([place]) Clinically stable by exam and report without s/s of hypoglycemia. DM complicated by  HTN.. Glucoses are controlled off on metformin - using only diet and exercise - Hemoglobin A1c  3. Abnormal breast biopsy I think she needs to go ahead and get the lumpectomy scheduled - even if it is several months out.  I do not want her to wait more than 6 months to have this done. She will call the surgery center and arrange a convenient date for her son to be here.   Partially dictated using Editor, commissioning. Any errors are unintentional.  Halina Maidens, MD [facility]  09/01/2019

## 2019-09-02 LAB — HEMOGLOBIN A1C
Est. average glucose Bld gHb Est-mCnc: 131 mg/dL
Hgb A1c MFr Bld: 6.2 % — ABNORMAL HIGH (ref 4.8–5.6)

## 2019-09-04 ENCOUNTER — Telehealth: Payer: Self-pay | Admitting: Surgery

## 2019-09-04 NOTE — Telephone Encounter (Signed)
Pt has been advised of pre admission date/time, Covid Testing date and Surgery date.  Surgery Date: 09/29/19 w/[place] same day @ 11:15 am for Needle Localization Preadmission Testing Date: 09/19/19 (1-5) Covid Testing Date: 09/27/19 - patient advised to go to the [facility] ([place])  [employer] Video sent via [employer] [facility] and [employer].  Patient has been made aware to call [phone], between 1-3:00pm the day before surgery, to find out what time to arrive.

## 2019-09-06 ENCOUNTER — Ambulatory Visit: Payer: Self-pay | Admitting: Surgery

## 2019-09-11 NOTE — Telephone Encounter (Signed)
Patient called regarding the most recent change to her surgery date (from 09/29/19 to 09/28/19), per providers request.  She states if she is unable to keep 09/29/19, she will hold off & reschedule for another date/time in the future.  Dawn Fields depends on her son for transportation & care w/these procedures & he lives in [place], so it is not easy for him to change dates w/his employer.  She has been advised a message will be routed to Dr. Hampton Abbot to see if her surgery can be changed back to 09/29/19.  Thank you

## 2019-09-11 NOTE — Telephone Encounter (Signed)
Patient is calling and is asking if you would give her a call when you get a chance.please call and advise.

## 2019-09-14 ENCOUNTER — Ambulatory Visit: Payer: Medicare Other

## 2019-09-19 ENCOUNTER — Encounter
Admission: RE | Admit: 2019-09-19 | Discharge: 2019-09-19 | Disposition: A | Discharge status: Home / Self Care | Payer: Medicare Other | Source: Ambulatory Visit | Attending: Surgery | Admitting: Surgery

## 2019-09-19 ENCOUNTER — Other Ambulatory Visit: Payer: Self-pay

## 2019-09-19 HISTORY — DX: Personal history of urinary calculi: Z87.442

## 2019-09-19 NOTE — Patient Instructions (Signed)
Your procedure is scheduled on: 09/29/19 Report to [facility] at previously scheduled time. (8:30) [phone].  Remember: Instructions that are not followed completely may result in serious medical risk, up to and including death, or upon the discretion of your surgeon and anesthesiologist your surgery may need to be rescheduled.     _X__ 1. Do not eat food after midnight the night before your procedure.                 No gum chewing or hard candies. You may drink clear liquids up to 2 hours                 before you are scheduled to arrive for your surgery- DO not drink clear                 liquids within 2 hours of the start of your surgery.                 Clear Liquids include:  water, apple juice without pulp, clear carbohydrate                 drink such as Clearfast or Gatorade, Black Coffee or Tea (Do not add                 anything to coffee or tea). Diabetics water only  __X__2.  On the morning of surgery brush your teeth with toothpaste and water, you                 may rinse your mouth with mouthwash if you wish.  Do not swallow any              toothpaste of mouthwash.     _X__ 3.  No Alcohol for 24 hours before or after surgery.   _X__ 4.  Do Not Smoke or use e-cigarettes For 24 Hours Prior to Your Surgery.                 Do not use any chewable tobacco products for at least 6 hours prior to                 surgery.  ____  5.  Bring all medications with you on the day of surgery if instructed.   __X__  6.  Notify your doctor if there is any change in your medical condition      (cold, fever, infections).     Do not wear jewelry, make-up, hairpins, clips or nail polish. Do not wear lotions, powders, or perfumes.  Do not shave 48 hours prior to surgery. Men may shave face and neck. Do not bring valuables to the hospital.    [facility] is not responsible for any belongings or valuables.  Contacts, dentures/partials or body piercings may not be worn  into surgery. Bring a case for your contacts, glasses or hearing aids, a denture cup will be supplied. Leave your suitcase in the car. After surgery it may be brought to your room. For patients admitted to the hospital, discharge time is determined by your treatment team.   Patients discharged the day of surgery will not be allowed to drive home.   Please read over the following fact sheets that you were given:   MRSA Information  __X__ Take these medicines the morning of surgery with A SIP OF WATER:    1. none  2.   3.   4.  5.  6.  ____  Fleet Enema (as directed)   __X__ Use CHG Soap/SAGE wipes as directed  ____ Use inhalers on the day of surgery  ____ Stop metformin/Janumet/Farxiga 2 days prior to surgery    ____ Take 1/2 of usual insulin dose the night before surgery. No insulin the morning          of surgery.   ____ Stop Blood Thinners Coumadin/Plavix/Xarelto/Pleta/Pradaxa/Eliquis/Effient/Aspirin  on   Or contact your Surgeon, Cardiologist or Medical Doctor regarding  ability to stop your blood thinners  __X__ Stop Anti-inflammatories 7 days before surgery such as Advil, Ibuprofen, Motrin,  BC or Goodies Powder, Naprosyn, Naproxen, Aleve, Aspirin    __X__ Stop all herbal supplements, fish oil or vitamin E until after surgery.    ____ Bring C-Pap to the hospital.

## 2019-09-20 ENCOUNTER — Encounter
Admission: RE | Admit: 2019-09-20 | Discharge: 2019-09-20 | Disposition: A | Discharge status: Home / Self Care | Payer: Medicare Other | Source: Ambulatory Visit | Attending: Surgery | Admitting: Surgery

## 2019-09-20 DIAGNOSIS — Z01818 Encounter for other preprocedural examination: Secondary | ICD-10-CM | POA: Diagnosis not present

## 2019-09-20 DIAGNOSIS — I10 Essential (primary) hypertension: Secondary | ICD-10-CM | POA: Diagnosis not present

## 2019-09-20 DIAGNOSIS — E119 Type 2 diabetes mellitus without complications: Secondary | ICD-10-CM | POA: Diagnosis not present

## 2019-09-20 LAB — CBC
HCT: 36.9 % (ref 36.0–46.0)
Hemoglobin: 12.2 g/dL (ref 12.0–15.0)
MCH: 29.7 pg (ref 26.0–34.0)
MCHC: 33.1 g/dL (ref 30.0–36.0)
MCV: 89.8 fL (ref 80.0–100.0)
Platelets: 278 10*3/uL (ref 150–400)
RBC: 4.11 MIL/uL (ref 3.87–5.11)
RDW: 13.3 % (ref 11.5–15.5)
WBC: 6.2 10*3/uL (ref 4.0–10.5)
nRBC: 0 % (ref 0.0–0.2)

## 2019-09-20 LAB — BASIC METABOLIC PANEL
Anion gap: 10 (ref 5–15)
BUN: 22 mg/dL (ref 8–23)
CO2: 25 mmol/L (ref 22–32)
Calcium: 9.4 mg/dL (ref 8.9–10.3)
Chloride: 104 mmol/L (ref 98–111)
Creatinine, Ser: 0.92 mg/dL (ref 0.44–1.00)
GFR calc Af Amer: >60 mL/min (ref >60)
GFR calc non Af Amer: >60 mL/min (ref >60)
Glucose, Bld: 93 mg/dL (ref 70–99)
Potassium: 4.2 mmol/L (ref 3.5–5.1)
Sodium: 139 mmol/L (ref 135–145)

## 2019-09-27 ENCOUNTER — Other Ambulatory Visit: Payer: Self-pay

## 2019-09-27 ENCOUNTER — Other Ambulatory Visit
Admission: RE | Admit: 2019-09-27 | Discharge: 2019-09-27 | Disposition: A | Discharge status: Home / Self Care | Payer: Medicare Other | Source: Ambulatory Visit | Attending: Surgery | Admitting: Surgery

## 2019-09-27 DIAGNOSIS — Z20822 Contact with and (suspected) exposure to covid-19: Secondary | ICD-10-CM | POA: Diagnosis not present

## 2019-09-27 DIAGNOSIS — Z01812 Encounter for preprocedural laboratory examination: Secondary | ICD-10-CM | POA: Diagnosis not present

## 2019-09-27 LAB — SARS CORONAVIRUS 2 (TAT 6-24 HRS): SARS Coronavirus 2: NEGATIVE

## 2019-09-28 ENCOUNTER — Ambulatory Visit: Payer: Medicare Other

## 2019-09-29 ENCOUNTER — Encounter: Payer: Self-pay | Admitting: Surgery

## 2019-09-29 ENCOUNTER — Encounter
Admission: RE | Disposition: A | Discharge status: Home / Self Care | Payer: Self-pay | Source: Ambulatory Visit | Attending: Surgery

## 2019-09-29 ENCOUNTER — Ambulatory Visit
Admission: RE | Admit: 2019-09-29 | Discharge: 2019-09-29 | Disposition: A | Discharge status: Home / Self Care | Payer: Medicare Other | Source: Ambulatory Visit | Attending: Surgery | Admitting: Surgery

## 2019-09-29 ENCOUNTER — Other Ambulatory Visit: Payer: Self-pay

## 2019-09-29 ENCOUNTER — Ambulatory Visit: Payer: Medicare Other | Admitting: Certified Registered"

## 2019-09-29 ENCOUNTER — Ambulatory Visit: Payer: Medicare Other

## 2019-09-29 DIAGNOSIS — Z8249 Family history of ischemic heart disease and other diseases of the circulatory system: Secondary | ICD-10-CM | POA: Diagnosis not present

## 2019-09-29 DIAGNOSIS — Z79899 Other long term (current) drug therapy: Secondary | ICD-10-CM | POA: Insufficient documentation

## 2019-09-29 DIAGNOSIS — N6489 Other specified disorders of breast: Secondary | ICD-10-CM

## 2019-09-29 DIAGNOSIS — Z888 Allergy status to other drugs, medicaments and biological substances status: Secondary | ICD-10-CM | POA: Diagnosis not present

## 2019-09-29 DIAGNOSIS — Z7982 Long term (current) use of aspirin: Secondary | ICD-10-CM | POA: Insufficient documentation

## 2019-09-29 DIAGNOSIS — Z803 Family history of malignant neoplasm of breast: Secondary | ICD-10-CM | POA: Diagnosis not present

## 2019-09-29 DIAGNOSIS — E1136 Type 2 diabetes mellitus with diabetic cataract: Secondary | ICD-10-CM | POA: Insufficient documentation

## 2019-09-29 DIAGNOSIS — N6032 Fibrosclerosis of left breast: Secondary | ICD-10-CM | POA: Insufficient documentation

## 2019-09-29 DIAGNOSIS — N6320 Unspecified lump in the left breast, unspecified quadrant: Secondary | ICD-10-CM | POA: Diagnosis not present

## 2019-09-29 DIAGNOSIS — Z833 Family history of diabetes mellitus: Secondary | ICD-10-CM | POA: Insufficient documentation

## 2019-09-29 DIAGNOSIS — K219 Gastro-esophageal reflux disease without esophagitis: Secondary | ICD-10-CM | POA: Diagnosis not present

## 2019-09-29 DIAGNOSIS — I10 Essential (primary) hypertension: Secondary | ICD-10-CM | POA: Insufficient documentation

## 2019-09-29 DIAGNOSIS — R928 Other abnormal and inconclusive findings on diagnostic imaging of breast: Secondary | ICD-10-CM | POA: Diagnosis not present

## 2019-09-29 DIAGNOSIS — H269 Unspecified cataract: Secondary | ICD-10-CM | POA: Insufficient documentation

## 2019-09-29 DIAGNOSIS — N6031 Fibrosclerosis of right breast: Secondary | ICD-10-CM | POA: Diagnosis not present

## 2019-09-29 DIAGNOSIS — E118 Type 2 diabetes mellitus with unspecified complications: Secondary | ICD-10-CM | POA: Diagnosis not present

## 2019-09-29 DIAGNOSIS — E782 Mixed hyperlipidemia: Secondary | ICD-10-CM | POA: Diagnosis not present

## 2019-09-29 HISTORY — PX: BREAST BIOPSY: SHX20

## 2019-09-29 HISTORY — PX: BREAST LUMPECTOMY WITH NEEDLE LOCALIZATION: SHX5759

## 2019-09-29 LAB — GLUCOSE, CAPILLARY
Glucose-Capillary: 104 mg/dL — ABNORMAL HIGH (ref 70–99)
Glucose-Capillary: 140 mg/dL — ABNORMAL HIGH (ref 70–99)

## 2019-09-29 SURGERY — BREAST LUMPECTOMY WITH NEEDLE LOCALIZATION
Anesthesia: General | Laterality: Left

## 2019-09-29 MED ORDER — SODIUM CHLORIDE 0.9 % IV SOLN: INTRAVENOUS | Status: DC

## 2019-09-29 MED ORDER — ESMOLOL HCL 100 MG/10ML IV SOLN
INTRAVENOUS | Status: AC
  Filled 2019-09-29: qty 10

## 2019-09-29 MED ORDER — EPHEDRINE SULFATE 50 MG/ML IJ SOLN
INTRAMUSCULAR | Status: DC | PRN
  Administered 2019-09-29: 5 mg via INTRAVENOUS

## 2019-09-29 MED ORDER — LACTATED RINGERS IV SOLN: INTRAVENOUS | Status: DC | PRN

## 2019-09-29 MED ORDER — ESMOLOL HCL 100 MG/10ML IV SOLN
INTRAVENOUS | Status: DC | PRN
  Administered 2019-09-29: 10 mg via INTRAVENOUS

## 2019-09-29 MED ORDER — GLYCOPYRROLATE 0.2 MG/ML IJ SOLN
INTRAMUSCULAR | Status: DC | PRN
  Administered 2019-09-29: .2 mg via INTRAVENOUS

## 2019-09-29 MED ORDER — OXYCODONE HCL 5 MG/5ML PO SOLN: 5.0000 mg | Freq: Once | ORAL | Status: DC | PRN

## 2019-09-29 MED ORDER — CEFAZOLIN SODIUM-DEXTROSE 2-4 GM/100ML-% IV SOLN
INTRAVENOUS | Status: AC
  Filled 2019-09-29: qty 100

## 2019-09-29 MED ORDER — LIDOCAINE HCL (CARDIAC) PF 100 MG/5ML IV SOSY
PREFILLED_SYRINGE | INTRAVENOUS | Status: DC | PRN
  Administered 2019-09-29: 100 mg via INTRAVENOUS

## 2019-09-29 MED ORDER — FAMOTIDINE 20 MG PO TABS
ORAL_TABLET | ORAL | Status: AC
  Filled 2019-09-29: qty 1

## 2019-09-29 MED ORDER — BUPIVACAINE-EPINEPHRINE (PF) 0.5% -1:200000 IJ SOLN
INTRAMUSCULAR | Status: AC
  Filled 2019-09-29: qty 30

## 2019-09-29 MED ORDER — ONDANSETRON HCL 4 MG/2ML IJ SOLN
INTRAMUSCULAR | Status: DC | PRN
  Administered 2019-09-29: 8 mg via INTRAVENOUS

## 2019-09-29 MED ORDER — CEFAZOLIN SODIUM-DEXTROSE 2-4 GM/100ML-% IV SOLN
2.0000 g | INTRAVENOUS | Status: AC
  Administered 2019-09-29: 12:00:00 2 g via INTRAVENOUS

## 2019-09-29 MED ORDER — GABAPENTIN 300 MG PO CAPS
ORAL_CAPSULE | ORAL | Status: AC
  Filled 2019-09-29: qty 1

## 2019-09-29 MED ORDER — SODIUM CHLORIDE FLUSH 0.9 % IV SOLN
INTRAVENOUS | Status: AC
  Filled 2019-09-29: qty 10

## 2019-09-29 MED ORDER — GABAPENTIN 300 MG PO CAPS
300.0000 mg | ORAL_CAPSULE | ORAL | Status: AC
  Administered 2019-09-29: 300 mg via ORAL

## 2019-09-29 MED ORDER — FENTANYL CITRATE (PF) 100 MCG/2ML IJ SOLN
INTRAMUSCULAR | Status: AC
  Filled 2019-09-29: qty 2

## 2019-09-29 MED ORDER — MIDAZOLAM HCL 2 MG/2ML IJ SOLN
INTRAMUSCULAR | Status: AC
  Filled 2019-09-29: qty 2

## 2019-09-29 MED ORDER — OXYCODONE HCL 5 MG PO TABS: 5.0000 mg | ORAL_TABLET | ORAL | 0 refills | Status: DC | PRN

## 2019-09-29 MED ORDER — METHYLENE BLUE 0.5 % INJ SOLN
INTRAVENOUS | Status: AC
  Filled 2019-09-29: qty 10

## 2019-09-29 MED ORDER — DEXMEDETOMIDINE HCL IN NACL 200 MCG/50ML IV SOLN
INTRAVENOUS | Status: DC | PRN
  Administered 2019-09-29: 4 ug via INTRAVENOUS

## 2019-09-29 MED ORDER — OXYCODONE HCL 5 MG PO TABS: 5.0000 mg | ORAL_TABLET | Freq: Once | ORAL | Status: DC | PRN

## 2019-09-29 MED ORDER — BUPIVACAINE-EPINEPHRINE 0.5% -1:200000 IJ SOLN
INTRAMUSCULAR | Status: DC | PRN
  Administered 2019-09-29: 30 mL

## 2019-09-29 MED ORDER — DEXAMETHASONE SODIUM PHOSPHATE 10 MG/ML IJ SOLN
INTRAMUSCULAR | Status: DC | PRN
  Administered 2019-09-29: 10 mg via INTRAVENOUS

## 2019-09-29 MED ORDER — IBUPROFEN 600 MG PO TABS: 600.0000 mg | ORAL_TABLET | Freq: Three times a day (TID) | ORAL | 1 refills | Status: DC | PRN

## 2019-09-29 MED ORDER — MIDAZOLAM HCL 2 MG/2ML IJ SOLN
INTRAMUSCULAR | Status: DC | PRN
  Administered 2019-09-29: 1 mg via INTRAVENOUS

## 2019-09-29 MED ORDER — CHLORHEXIDINE GLUCONATE CLOTH 2 % EX PADS
6.0000 | MEDICATED_PAD | Freq: Once | CUTANEOUS | Status: AC
  Administered 2019-09-29: 10:00:00 6 via TOPICAL

## 2019-09-29 MED ORDER — PROPOFOL 10 MG/ML IV BOLUS
INTRAVENOUS | Status: DC | PRN
  Administered 2019-09-29: 150 mg via INTRAVENOUS

## 2019-09-29 MED ORDER — ACETAMINOPHEN 500 MG PO TABS
ORAL_TABLET | ORAL | Status: AC
  Filled 2019-09-29: qty 2

## 2019-09-29 MED ORDER — FENTANYL CITRATE (PF) 100 MCG/2ML IJ SOLN: 25.0000 ug | INTRAMUSCULAR | Status: DC | PRN

## 2019-09-29 MED ORDER — ACETAMINOPHEN 500 MG PO TABS
1000.0000 mg | ORAL_TABLET | ORAL | Status: AC
  Administered 2019-09-29: 1000 mg via ORAL

## 2019-09-29 MED ORDER — FAMOTIDINE 20 MG PO TABS
20.0000 mg | ORAL_TABLET | Freq: Once | ORAL | Status: AC
  Administered 2019-09-29: 10:00:00 20 mg via ORAL

## 2019-09-29 MED ORDER — FENTANYL CITRATE (PF) 100 MCG/2ML IJ SOLN
INTRAMUSCULAR | Status: DC | PRN
  Administered 2019-09-29 (×5): 25 ug via INTRAVENOUS

## 2019-09-29 SURGICAL SUPPLY — 48 items
APPLIER CLIP 9.375 SM OPEN (CLIP)
BINDER BREAST LRG (GAUZE/BANDAGES/DRESSINGS) IMPLANT
BINDER BREAST MEDIUM (GAUZE/BANDAGES/DRESSINGS) IMPLANT
BLADE PHOTON ILLUMINATED (MISCELLANEOUS) ×2 IMPLANT
BLADE SURG 15 STRL LF DISP TIS (BLADE) ×2 IMPLANT
BLADE SURG 15 STRL SS (BLADE) ×2
CANISTER SUCT 1200ML W/VALVE (MISCELLANEOUS) ×2 IMPLANT
CHLORAPREP W/TINT 26 (MISCELLANEOUS) ×2 IMPLANT
CLIP APPLIE 9.375 SM OPEN (CLIP) IMPLANT
CNTNR SPEC 2.5X3XGRAD LEK (MISCELLANEOUS)
CONT SPEC 4OZ STER OR WHT (MISCELLANEOUS)
CONTAINER SPEC 2.5X3XGRAD LEK (MISCELLANEOUS) IMPLANT
COVER PROBE FLX POLY STRL (MISCELLANEOUS) IMPLANT
COVER WAND RF STERILE (DRAPES) IMPLANT
DERMABOND ADVANCED (GAUZE/BANDAGES/DRESSINGS) ×1
DERMABOND ADVANCED .7 DNX12 (GAUZE/BANDAGES/DRESSINGS) ×1 IMPLANT
DEVICE DUBIN SPECIMEN MAMMOGRA (MISCELLANEOUS) ×2 IMPLANT
DRAPE LAPAROTOMY 100X77 ABD (DRAPES) ×2 IMPLANT
DRSG GAUZE FLUFF 36X18 (GAUZE/BANDAGES/DRESSINGS) ×2 IMPLANT
ELECT CAUTERY BLADE 6.4 (BLADE) ×2 IMPLANT
ELECT REM PT RETURN 9FT ADLT (ELECTROSURGICAL) ×2
ELECTRODE REM PT RTRN 9FT ADLT (ELECTROSURGICAL) ×1 IMPLANT
GLOVE SURG SYN 7.0 (GLOVE) ×6 IMPLANT
GLOVE SURG SYN 7.5  E (GLOVE) ×3
GLOVE SURG SYN 7.5 E (GLOVE) ×3 IMPLANT
GOWN STRL REUS W/ TWL LRG LVL3 (GOWN DISPOSABLE) ×2 IMPLANT
GOWN STRL REUS W/TWL LRG LVL3 (GOWN DISPOSABLE) ×2
KIT MARKER MARGIN INK (KITS) ×4 IMPLANT
KIT TURNOVER KIT A (KITS) ×2 IMPLANT
LABEL OR SOLS (LABEL) ×2 IMPLANT
MARGIN MAP 10MM (MISCELLANEOUS) ×2 IMPLANT
MARKER MARGIN CORRECT CLIP (MARKER) ×2 IMPLANT
NEEDLE HYPO 22GX1.5 SAFETY (NEEDLE) ×2 IMPLANT
NEEDLE HYPO 25X1 1.5 SAFETY (NEEDLE) IMPLANT
PACK BASIN MINOR ARMC (MISCELLANEOUS) ×2 IMPLANT
SLEVE PROBE SENORX GAMMA FIND (MISCELLANEOUS) IMPLANT
SUT ETHILON 3-0 FS-10 30 BLK (SUTURE) ×2
SUT MNCRL 4-0 (SUTURE) ×1
SUT MNCRL 4-0 27XMFL (SUTURE) ×1
SUT SILK 3 0 SH 30 (SUTURE) ×2 IMPLANT
SUT VIC AB 3-0 SH 27 (SUTURE) ×2
SUT VIC AB 3-0 SH 27X BRD (SUTURE) ×2 IMPLANT
SUTURE EHLN 3-0 FS-10 30 BLK (SUTURE) ×1 IMPLANT
SUTURE MNCRL 4-0 27XMF (SUTURE) ×1 IMPLANT
SYR 10ML LL (SYRINGE) ×2 IMPLANT
SYR BULB IRRIG 60ML STRL (SYRINGE) ×2 IMPLANT
TAPE TRANSPORE STRL 2 31045 (GAUZE/BANDAGES/DRESSINGS) IMPLANT
WATER STERILE IRR 1000ML POUR (IV SOLUTION) ×2 IMPLANT

## 2019-09-29 NOTE — Op Note (Signed)
  Procedure Date:  09/29/2019  Pre-operative Diagnosis:  Left breast complex sclerosing lesion  Post-operative Diagnosis:  Left breast complex sclerosing lesion  Procedure:  Left breast wire-localized lumpectomy  Surgeon:  Melvyn Neth, MD  Assistant:  Mat Carne, PA-S  Anesthesia:  General endotracheal  Estimated Blood Loss:  5 ml  Specimens:  Left breast mass  Complications:  None  Indications for Procedure:  This is a 74 y.o. female who presents with a left breast complex sclerosing lesion, and wishes to have it excised.  The risks of bleeding, infection, injury to surrounding structures, hematoma, seroma, open wound, cosmetic deformity, and the need for further surgery were all discussed with the patient and was willing to proceed.  Prior to this procedure, the patient had undergone wire localization.  Description of Procedure: The patient was correctly identified in the preoperative area and brought into the operating room.  The patient was placed supine with VTE prophylaxis in place.  Appropriate time-outs were performed.  Anesthesia was induced and the patient was intubated.  Appropriate antibiotics were infused.  The left chest and axilla were prepped and draped in usual sterile fashion.  Attention was turned to the needle localization site where an incision was made encompassing the needle insertion site. Elecrocautery was used for dissection around the needle to perform a partial mastectomy with adequate margins.  The margins were marked with MarginMarker and CorrectClips.  The specimen including the wire was sent to radiology which confirmed an intact wire and prior biopsy site clip.  The cavity was irrigated and hemostasis was assured with electrocautery.  Local anesthetic was infiltrated into the skin and subcutaneous tissue of the cavity.  The wound was thoroughly irrigated.  The wound was then closed in two layers with 3-0 Vicryl and 4-0 Monocryl and sealed with DermaBond.   Fluffed gauze was placed over the left breast and breast binder was placed.  The patient was emerged from anesthesia and extubated and brought to the recovery room for further management.  The patient tolerated the procedure well and all counts were correct at the end of the case.   Melvyn Neth, MD

## 2019-09-29 NOTE — Transfer of Care (Signed)
Immediate Anesthesia Transfer of Care Note  Patient: Dawn Fields  Procedure(s) Performed: BREAST LUMPECTOMY WITH NEEDLE LOCALIZATION (Left )  Patient Location: PACU  Anesthesia Type:General  Level of Consciousness: drowsy, patient cooperative and responds to stimulation  Airway & Oxygen Therapy: Patient Spontanous Breathing and Patient connected to face mask oxygen  Post-op Assessment: Report given to RN and Post -op Vital signs reviewed and stable  Post vital signs: Reviewed and stable  Last Vitals:  Vitals Value Taken Time  BP 140/83 09/29/19 1347  Temp    Pulse 98 09/29/19 1349  Resp 17 09/29/19 1349  SpO2 99 % 09/29/19 1349  Vitals shown include unvalidated device data.  Last Pain:  Vitals:   09/29/19 1347  TempSrc:   PainSc: (P) Asleep         Complications: No apparent anesthesia complications

## 2019-09-29 NOTE — Anesthesia Preprocedure Evaluation (Signed)
Anesthesia Evaluation  Patient identified by MRN, date of birth, ID band Patient awake    Reviewed: Allergy & Precautions, H&P , NPO status , Patient's Chart, lab work & pertinent test results  History of Anesthesia Complications Negative for: history of anesthetic complications  Airway Mallampati: III  TM Distance: >3 FB Neck ROM: limited    Dental  (+) Chipped   Pulmonary neg pulmonary ROS, neg shortness of breath,           Cardiovascular Exercise Tolerance: Good hypertension, (-) angina(-) Past MI and (-) DOE      Neuro/Psych  Neuromuscular disease negative psych ROS   GI/Hepatic Neg liver ROS, GERD  Medicated and Controlled,  Endo/Other  diabetes, Type 2  Renal/GU Renal disease     Musculoskeletal   Abdominal   Peds  Hematology negative hematology ROS (+)   Anesthesia Other Findings Past Medical History: Pollen: Allergy No date: Carpal tunnel syndrome of right wrist Surgery Pending: Cataract No date: GERD (gastroesophageal reflux disease) No date: History of kidney stones No date: Hyperlipidemia No date: Hypertension No date: Multinodular goiter No date: Type II diabetes mellitus with complication ([place])  Past Surgical History: No date: ABDOMINAL HYSTERECTOMY     Comment:  Partial - Still have ovaries. - Cyst on uterus  07/20/2019: BREAST BIOPSY; Left     Comment:  sclerosing lesion No date: BREAST SURGERY     Comment:  biopsy left breast 02/04/2018: COLONOSCOPY     Comment:  one polyp -  No date: DILATION AND CURETTAGE OF UTERUS No date: fibroid biopsy No date: TONSILLECTOMY  BMI    Body Mass Index: 31.87 kg/m      Reproductive/Obstetrics negative OB ROS                             Anesthesia Physical Anesthesia Plan  ASA: III  Anesthesia Plan: General LMA   Post-op Pain Management:    Induction: Intravenous  PONV Risk Score and Plan: Dexamethasone,  Ondansetron, Midazolam and Treatment may vary due to age or medical condition  Airway Management Planned: LMA  Additional Equipment:   Intra-op Plan:   Post-operative Plan: Extubation in OR  Informed Consent: I have reviewed the patients History and Physical, chart, labs and discussed the procedure including the risks, benefits and alternatives for the proposed anesthesia with the patient or authorized representative who has indicated his/her understanding and acceptance.     Dental Advisory Given  Plan Discussed with: Anesthesiologist, CRNA and Surgeon  Anesthesia Plan Comments: (Patient consented for risks of anesthesia including but not limited to:  - adverse reactions to medications - damage to teeth, lips or other oral mucosa - sore throat or hoarseness - Damage to heart, brain, lungs or loss of life  Patient voiced understanding.)        Anesthesia Quick Evaluation

## 2019-09-29 NOTE — Discharge Instructions (Signed)

## 2019-09-29 NOTE — H&P (Signed)
Reason for Visit:  Left breast calcifications  Referring Provider:  Halina Maidens, MD  History of Present Illness: Dawn Fields is a 74 y.o. female presenting for evaluation of left breast calcifications found on routine mammogram and confirmed on diagnostic mammogram.  She had been previously doing well with normal mammograms.  Most recently, she was found to have an area of calcifications extending about 9 mm on the upper outer quadrant of the left breast, middle depth.  She had a core biopsy of the area and pathology resulted in complex sclerosing lesion.  She presents for evaluation and discussion of possible surgery.  Prior to the mammogram, the patient denies having an breast pain, skin changes, palpable masses, nipple changes or drainage.  She does have a family history of breast cancer in her aunt.  Past Medical History:     Past Medical History:  Diagnosis Date  . Allergy Pollen  . Carpal tunnel syndrome of right wrist   . Cataract Surgery Pending  . GERD (gastroesophageal reflux disease)   . Hyperlipidemia   . Hypertension   . Kidney stones   . Multinodular goiter   . Type II diabetes mellitus with complication [facility])      Past Surgical History:      Past Surgical History:  Procedure Laterality Date  . ABDOMINAL HYSTERECTOMY     Partial - Still have ovaries. - Cyst on uterus   . COLONOSCOPY  02/04/2018   one polyp -   . TONSILLECTOMY      Home Medications:        Prior to Admission medications   Medication Sig Start Date End Date Taking? Authorizing Provider  aspirin EC 81 MG tablet Take 81 mg by mouth daily.   Yes [provider]  cholecalciferol (VITAMIN D) 400 units TABS tablet Take 2,000 Units by mouth daily.    Yes [provider]  docusate sodium (COLACE) 100 MG capsule Take 100 mg by mouth 2 (two) times daily.   Yes [provider]  latanoprost (XALATAN) 0.005 % ophthalmic solution Place 1 drop into  both eyes at bedtime.   Yes [provider]  lisinopril (ZESTRIL) 10 MG tablet TAKE 2 TABLETS (20 MG) BY MOUTH EVERY DAY 03/28/19  Yes [provider]  Omega-3 Fatty Acids (FISH OIL) 1200 MG CAPS Take 1,200 mg by mouth 1 day or 1 dose.   Yes [provider]  OneTouch Delica Lancets 99991111 MISC USE AS DIRECTED ONCE DAILY 12/12/18  Yes Glean Hess, MD  [facility] ULTRA test strip USE AS DIRECTED ONCE DAILY 07/25/19  Yes Glean Hess, MD  Plant Sterols and Stanols (CHOLESTOFF PLUS PO) Take by mouth.   Yes [provider]  vitamin B-12 (CYANOCOBALAMIN) 1000 MCG tablet Take 5,000 mcg by mouth daily.   Yes [provider]    Allergies:      Allergies  Allergen Reactions  . Esomeprazole Magnesium Other (See Comments)    Other Reaction: GI UPSET  . Rosuvastatin Other (See Comments)    Joint pain    Social History:  reports that she has never smoked. She has never used smokeless tobacco. She reports that she does not drink alcohol or use drugs.   Family History:      Family History  Problem Relation Age of Onset  . Stroke Mother   . Hypertension Mother   . Liver cancer Father   . COPD Sister   . Diabetes Sister   . Diabetes Brother   .  Stroke Brother   . Hearing loss Brother   . Diabetes Maternal Grandmother   . Diabetes Maternal Grandfather   . Diabetes Sister   . Mental illness Sister     Review of Systems: Review of Systems  Constitutional: Negative for chills and fever.  HENT: Negative for hearing loss.   Respiratory: Negative for cough and shortness of breath.   Cardiovascular: Negative for chest pain.  Gastrointestinal: Negative for nausea and vomiting.  Genitourinary: Negative for dysuria.  Musculoskeletal: Negative for myalgias.  Skin: Negative for rash.  Neurological: Negative for dizziness.  Psychiatric/Behavioral: Negative for depression.    Physical Exam BP 126/75   Pulse 70    Temp (!) 97 F (36.1 C) (Temporal)   Resp 12   Ht 5\' 3"  (1.6 m)   Wt 82.1 kg   SpO2 97%   BMI 32.06 kg/m  CONSTITUTIONAL: No acute distress HEENT:  Normocephalic, atraumatic, extraocular motion intact. NECK: Trachea is midline, and there is no jugular venous distension.  RESPIRATORY:  Lungs are clear, and breath sounds are equal bilaterally. Normal respiratory effort without pathologic use of accessory muscles. CARDIOVASCULAR: Heart is regular without murmurs, gallops, or rubs. BREAST: Left breast status post core needle biopsy with biopsy site healing well with no evidence of infection or bruising or hematoma.  Otherwise no palpable masses, no skin changes, no nipple changes.  No left axillary or supraclavicular lymphadenopathy.  On the right breast, there are no palpable masses, skin changes, or nipple changes.  There is no right axillary or supraclavicular lymphadenopathy. GI: The abdomen is soft, nondistended, nontender. MUSCULOSKELETAL:  Normal muscle strength and tone in all four extremities.  No peripheral edema or cyanosis. SKIN: Skin turgor is normal. There are no pathologic skin lesions.  NEUROLOGIC:  Motor and sensation is grossly normal.  Cranial nerves are grossly intact. PSYCH:  Alert and oriented to person, place and time. Affect is normal.  Laboratory Analysis: Pathology 07/20/19: --Complex sclerosing lesion  Imaging: Mammogram 07/04/19: --group of amorphous calcifications in the left breast upper outer quadrant, middle depth, 9 mm.  There is no associated mass   Assessment and Plan: This is a 74 y.o. female with a left breast complex sclerosing lesion in the upper outer quadrant.   -Discussed with the patient that overall a complex sclerosing lesion, otherwise known as a radial scar, is a benign finding.  However based on the data, there is possibility although low that this could in the future be upgraded to cancer or DCIS or that on excision this could be  upgraded to cancer or DCIS.  In order to be 100% sure that this overall is an negative for benign finding, the best approach would be to proceed with excision.  However, based on the data, there is also a potential role for watchful waiting and follow-up imaging.  The patient reports that she has been researching this further and doing her homework and she would rather proceed with excision.  I think is completely reasonable and is my recommendation of choice as well. -Discussed with patient the role for a wire localized left breast lumpectomy.  Discussed with her the risks of bleeding, infection, injury to surrounding structures.  Discussed with her that she will proceed to mammography first for wire localization following by the lumpectomy itself.  This will be an outpatient procedure.  Discussed with her that she will have to stop her aspirin for 5 days prior to surgery and she would also have to be Covid tested  before as well.   Melvyn Neth, [facility]

## 2019-09-29 NOTE — Anesthesia Procedure Notes (Signed)
Procedure Name: LMA Insertion Performed by: Fletcher-Harrison, Jenson Beedle, CRNA Pre-anesthesia Checklist: Patient identified, Emergency Drugs available, Suction available and Patient being monitored Patient Re-evaluated:Patient Re-evaluated prior to induction Oxygen Delivery Method: Circle system utilized Preoxygenation: Pre-oxygenation with 100% oxygen Induction Type: IV induction Ventilation: Mask ventilation without difficulty LMA: LMA inserted LMA Size: 4.0 Number of attempts: 1 Placement Confirmation: positive ETCO2,  CO2 detector and breath sounds checked- equal and bilateral Tube secured with: Tape Dental Injury: Teeth and Oropharynx as per pre-operative assessment        

## 2019-09-29 NOTE — Anesthesia Postprocedure Evaluation (Signed)
Anesthesia Post Note  Patient: Dawn Fields  Procedure(s) Performed: BREAST LUMPECTOMY WITH NEEDLE LOCALIZATION (Left )  Patient location during evaluation: PACU Anesthesia Type: General Level of consciousness: awake and alert Pain management: pain level controlled Vital Signs Assessment: post-procedure vital signs reviewed and stable Respiratory status: spontaneous breathing, nonlabored ventilation and respiratory function stable Cardiovascular status: blood pressure returned to baseline and stable Postop Assessment: no apparent nausea or vomiting Anesthetic complications: no     Last Vitals:  Vitals:   09/29/19 1430 09/29/19 1456  BP: 134/74 (!) 141/78  Pulse: 91 93  Resp: 16 16  Temp: 36.7 C   SpO2: 99% 99%    Last Pain:  Vitals:   09/29/19 1456  TempSrc:   PainSc: 0-No pain                 Tera Mater

## 2019-10-02 LAB — SURGICAL PATHOLOGY

## 2019-10-03 NOTE — Progress Notes (Signed)
10/03/19  Called patient today to review pathology results from her left breast wire-localized lumpectomy on 09/29/19.  Pathology shows benign breast parenchyma with prior biopsy site changes and scar.  Negative for atypia or malignancy, and negative margins.  Patient reports she's doing well, without any significant pain, swelling or issues.  She will follow up as scheduled on 10/13/19.  Olean Ree, MD

## 2019-10-12 DIAGNOSIS — H25013 Cortical age-related cataract, bilateral: Secondary | ICD-10-CM | POA: Diagnosis not present

## 2019-10-12 DIAGNOSIS — H401131 Primary open-angle glaucoma, bilateral, mild stage: Secondary | ICD-10-CM | POA: Diagnosis not present

## 2019-10-12 DIAGNOSIS — H40053 Ocular hypertension, bilateral: Secondary | ICD-10-CM | POA: Diagnosis not present

## 2019-10-12 DIAGNOSIS — H2513 Age-related nuclear cataract, bilateral: Secondary | ICD-10-CM | POA: Diagnosis not present

## 2019-10-12 DIAGNOSIS — E119 Type 2 diabetes mellitus without complications: Secondary | ICD-10-CM | POA: Diagnosis not present

## 2019-10-13 ENCOUNTER — Other Ambulatory Visit: Payer: Self-pay

## 2019-10-13 ENCOUNTER — Ambulatory Visit (INDEPENDENT_AMBULATORY_CARE_PROVIDER_SITE_OTHER): Payer: Self-pay | Admitting: Surgery

## 2019-10-13 ENCOUNTER — Encounter: Payer: Self-pay | Admitting: Surgery

## 2019-10-13 DIAGNOSIS — Z09 Encounter for follow-up examination after completed treatment for conditions other than malignant neoplasm: Secondary | ICD-10-CM

## 2019-10-13 DIAGNOSIS — N6489 Other specified disorders of breast: Secondary | ICD-10-CM

## 2019-10-13 NOTE — Progress Notes (Signed)
10/13/2019  HPI: Dawn Fields is a 74 y.o. female s/p wire localized left breast lumpectomy on 09/29/2019.  Patient presents today for follow-up.  She reports she is doing well with only some soreness over the left breast at the incision site.  She reports some sensation of fullness or pulling down when she is not wearing her breast binder but otherwise is enjoying wearing it.  Vital signs: There were no vitals taken for this visit.   Physical Exam: Constitutional: No acute distress Breast: Left breast status post lumpectomy of the upper outer quadrant.  Incision healing very well with no evidence of infection, breakdown, or other complication.  There are some mild surrounding ecchymosis.  No palpable masses.  Assessment/Plan: This is a 74 y.o. female s/p left breast wire localized lumpectomy.  -Reviewed again with her pathology results showing benign breast parenchyma with prior biopsy site changes and scar.  Negative for malignancy or atypia. -Patient is healing well without any complications.  Discussed with her that she could stop wearing her breast binder can transition for now to a sports bra though still offer some support and compression. -Patient is able to assume more normal activities. -Patient will follow-up around November 2021 with diagnostic mammogram and exam.   Melvyn Neth, MD [facility]

## 2019-10-13 NOTE — Patient Instructions (Addendum)
Dr.Piscoya informed patient she may continue to walking and exercising. Patient is to avoid any extraneous exercises to prevent any pain or discomfort to the area. Dr.Piscoya informed patient that over time with healing, the hardness in the area will get softer as time goes. Patient will have a follow up mammogram in one year. Patient was advised she may try Sports Bras for two weeks before going back to her normal bras. Patient will follow up with our office in October after she has had her yearly mammogram.   Breast Self-Awareness Breast self-awareness means being familiar with how your breasts look and feel. It involves checking your breasts regularly and reporting any changes to your health care provider. Practicing breast self-awareness is important. Sometimes changes may not be harmful (are benign), but sometimes a change in your breasts can be a sign of a serious medical problem. It is important to learn how to do this procedure correctly so that you can catch problems early, when treatment is more likely to be successful. All women should practice breast self-awareness, including women who have had breast implants. What you need:  A mirror.  A well-lit room. How to do a breast self-exam A breast self-exam is one way to learn what is normal for your breasts and whether your breasts are changing. To do a breast self-exam: Look for changes  1. Remove all the clothing above your waist. 2. Stand in front of a mirror in a room with good lighting. 3. Put your hands on your hips. 4. Push your hands firmly downward. 5. Compare your breasts in the mirror. Look for differences between them (asymmetry), such as: ? Differences in shape. ? Differences in size. ? Puckers, dips, and bumps in one breast and not the other. 6. Look at each breast for changes in the skin, such as: ? Redness. ? Scaly areas. 7. Look for changes in your nipples, such  as: ? Discharge. ? Bleeding. ? Dimpling. ? Redness. ? A change in position. Feel for changes Carefully feel your breasts for lumps and changes. It is best to do this while lying on your back on the floor, and again while sitting or standing in the tub or shower with soapy water on your skin. Feel each breast in the following way: 1. Place the arm on the side of the breast you are examining above your head. 2. Feel your breast with the other hand. 3. Start in the nipple area and make -inch (2 cm) overlapping circles to feel your breast. Use the pads of your three middle fingers to do this. Apply light pressure, then medium pressure, then firm pressure. The light pressure will allow you to feel the tissue closest to the skin. The medium pressure will allow you to feel the tissue that is a little deeper. The firm pressure will allow you to feel the tissue close to the ribs. 4. Continue the overlapping circles, moving downward over the breast until you feel your ribs below your breast. 5. Move one finger-width toward the center of the body. Continue to use the -inch (2 cm) overlapping circles to feel your breast as you move slowly up toward your collarbone. 6. Continue the up-and-down exam using all three pressures until you reach your armpit.  Write down what you find Writing down what you find can help you remember what to discuss with your health care provider. Write down:  What is normal for each breast.  Any changes that you find in each breast, including: ?  The kind of changes you find. ? Any pain or tenderness. ? Size and location of any lumps.  Where you are in your menstrual cycle, if you are still menstruating. General tips and recommendations  Examine your breasts every month.  If you are breastfeeding, the best time to examine your breasts is after a feeding or after using a breast pump.  If you menstruate, the best time to examine your breasts is 5-7 days after your period.  Breasts are generally lumpier during menstrual periods, and it may be more difficult to notice changes.  With time and practice, you will become more familiar with the variations in your breasts and more comfortable with the exam. Contact a health care provider if you:  See a change in the shape or size of your breasts or nipples.  See a change in the skin of your breast or nipples, such as a reddened or scaly area.  Have unusual discharge from your nipples.  Find a lump or thick area that was not there before.  Have pain in your breasts.  Have any concerns related to your breast health. Summary  Breast self-awareness includes looking for physical changes in your breasts, as well as feeling for any changes within your breasts.  Breast self-awareness should be performed in front of a mirror in a well-lit room.  You should examine your breasts every month. If you menstruate, the best time to examine your breasts is 5-7 days after your menstrual period.  Let your health care provider know of any changes you notice in your breasts, including changes in size, changes on the skin, pain or tenderness, or unusual fluid from your nipples. This information is not intended to replace advice given to you by your health care provider. Make sure you discuss any questions you have with your health care provider. Document Revised: 03/15/2018 Document Reviewed: 03/15/2018 Elsevier Patient Education  [place].

## 2019-11-16 DIAGNOSIS — H25011 Cortical age-related cataract, right eye: Secondary | ICD-10-CM | POA: Diagnosis not present

## 2019-11-16 DIAGNOSIS — H2511 Age-related nuclear cataract, right eye: Secondary | ICD-10-CM | POA: Diagnosis not present

## 2019-11-17 DIAGNOSIS — H2512 Age-related nuclear cataract, left eye: Secondary | ICD-10-CM | POA: Diagnosis not present

## 2019-12-03 ENCOUNTER — Other Ambulatory Visit: Payer: Self-pay | Admitting: Internal Medicine

## 2019-12-03 NOTE — Telephone Encounter (Signed)
Requested Prescriptions  Pending Prescriptions Disp Refills  . OneTouch Delica Lancets [id number] MISC [Pharmacy Med Name: ONE TOUCH DELICA [id number] LANCETS] [id number] each 3    Sig: USE AS DIRECTED ONCE DAILY     Endocrinology: Diabetes - Testing Supplies Passed - 12/03/2019 10:04 AM      Passed - Valid encounter within last 12 months    Recent Outpatient Visits          3 months ago Essential hypertension   [facility] Glean Hess, MD   6 months ago Annual physical exam   [facility] Glean Hess, MD   11 months ago Essential hypertension   [facility] Glean Hess, MD   1 year ago Annual physical exam   [facility] Glean Hess, MD   1 year ago Essential hypertension   [facility] Glean Hess, MD      Future Appointments            In 1 month Army Melia Jesse Sans, MD [facility], [place]   In 5 months Army Melia Jesse Sans, MD [facility], [facility]

## 2019-12-14 DIAGNOSIS — H2512 Age-related nuclear cataract, left eye: Secondary | ICD-10-CM | POA: Diagnosis not present

## 2019-12-14 DIAGNOSIS — H25012 Cortical age-related cataract, left eye: Secondary | ICD-10-CM | POA: Diagnosis not present

## 2019-12-16 ENCOUNTER — Other Ambulatory Visit: Payer: Self-pay | Admitting: Internal Medicine

## 2019-12-16 DIAGNOSIS — I10 Essential (primary) hypertension: Secondary | ICD-10-CM

## 2019-12-16 NOTE — Telephone Encounter (Signed)
Requested medication (s) are due for refill today: yes  Requested medication (s) are on the active medication list: historical med  Last refill:  05/11/19  Future visit scheduled: no  Notes to clinic:  historical med and provider   Requested Prescriptions  Pending Prescriptions Disp Refills   lisinopril (ZESTRIL) 10 MG tablet [Pharmacy Med Name: LISINOPRIL 10 MG TABLET] 180 tablet 3    Sig: TAKE 2 TABLETS (20 MG) BY MOUTH EVERY DAY      Cardiovascular:  ACE Inhibitors Failed - 12/16/2019  9:48 AM      Failed - Last BP in normal range    BP Readings from Last 1 Encounters:  09/29/19 (!) 141/78          Passed - Cr in normal range and within 180 days    Creatinine, Ser  Date Value Ref Range Status  09/20/2019 0.92 0.44 - 1.00 mg/dL Final          Passed - K in normal range and within 180 days    Potassium  Date Value Ref Range Status  09/20/2019 4.2 3.5 - 5.1 mmol/L Final          Passed - Patient is not pregnant      Passed - Valid encounter within last 6 months    Recent Outpatient Visits           3 months ago Essential hypertension   [facility] Glean Hess, MD   7 months ago Annual physical exam   [facility] Glean Hess, MD   11 months ago Essential hypertension   [facility] Glean Hess, MD   1 year ago Annual physical exam   [facility] Glean Hess, MD   1 year ago Essential hypertension   [facility] Glean Hess, MD       Future Appointments             In 2 weeks Army Melia Jesse Sans, MD [facility], [place]   In 4 months Army Melia Jesse Sans, MD [facility], [facility]

## 2020-01-04 ENCOUNTER — Other Ambulatory Visit: Payer: Self-pay

## 2020-01-04 ENCOUNTER — Encounter: Payer: Self-pay | Admitting: Internal Medicine

## 2020-01-04 ENCOUNTER — Ambulatory Visit (INDEPENDENT_AMBULATORY_CARE_PROVIDER_SITE_OTHER): Payer: Medicare Other | Admitting: Internal Medicine

## 2020-01-04 VITALS — BP 118/78 | HR 67 | Temp 97.8°F | Ht 63.0 in | Wt 183.0 lb

## 2020-01-04 DIAGNOSIS — D171 Benign lipomatous neoplasm of skin and subcutaneous tissue of trunk: Secondary | ICD-10-CM | POA: Diagnosis not present

## 2020-01-04 DIAGNOSIS — I10 Essential (primary) hypertension: Secondary | ICD-10-CM

## 2020-01-04 DIAGNOSIS — E118 Type 2 diabetes mellitus with unspecified complications: Secondary | ICD-10-CM | POA: Diagnosis not present

## 2020-01-04 NOTE — Progress Notes (Signed)
Date:  01/04/2020   Name:  Dawn Fields   DOB:  03/08/1946   MRN:  [id number]   Chief Complaint: Hypertension (Follow up. ), Pre-DM (A1C recheck.), and Mass (Lump on right clavicle. Been there since the beginning of the month. Not painful- or growing in size. Feels like it may have moved and gotten smaller. )  Diabetes She presents for her follow-up diabetic visit. She has type 2 diabetes mellitus. Her disease course has been stable. Pertinent negatives for hypoglycemia include no headaches or tremors. Pertinent negatives for diabetes include no chest pain, no fatigue, no polydipsia and no polyuria. There are no diabetic complications. When asked about current treatments, none (metformin stopped 6 mo ago) were reported. She monitors blood glucose at home 1-2 x per day. Her breakfast blood glucose is taken between 6-7 am. Her breakfast blood glucose range is generally 110-130 mg/dl. An ACE inhibitor/angiotensin II receptor blocker is being taken.  Hypertension This is a chronic problem. The problem is controlled. Pertinent negatives include no chest pain, headaches, palpitations or shortness of breath. Past treatments include ACE inhibitors. The current treatment provides significant improvement.  Mass on neck - noticed a soft mobile mass on her right collar bone about one month ago.  It is gradually enlarging.  No pain, no trauma.  Lab Results  Component Value Date   CREATININE 0.92 09/20/2019   BUN 22 09/20/2019   NA 139 09/20/2019   K 4.2 09/20/2019   CL 104 09/20/2019   CO2 25 09/20/2019   Lab Results  Component Value Date   CHOL 226 (H) 05/11/2019   HDL 88 05/11/2019   LDLCALC 122 (H) 05/11/2019   TRIG 95 05/11/2019   CHOLHDL 2.6 05/11/2019   Lab Results  Component Value Date   TSH 1.510 05/11/2019   Lab Results  Component Value Date   HGBA1C 6.2 (H) 09/01/2019   Lab Results  Component Value Date   WBC 6.2 09/20/2019   HGB 12.2 09/20/2019   HCT 36.9 09/20/2019   MCV  89.8 09/20/2019   PLT 278 09/20/2019   Lab Results  Component Value Date   ALT 17 05/11/2019   AST 18 05/11/2019   ALKPHOS 77 05/11/2019   BILITOT 0.2 05/11/2019     Review of Systems  Constitutional: Negative for appetite change, fatigue, fever and unexpected weight change.  HENT: Negative for tinnitus and trouble swallowing.   Eyes: Negative for visual disturbance.  Respiratory: Negative for cough, chest tightness and shortness of breath.   Cardiovascular: Negative for chest pain, palpitations and leg swelling.  Gastrointestinal: Negative for abdominal pain.  Endocrine: Negative for polydipsia and polyuria.  Genitourinary: Negative for dysuria and hematuria.  Musculoskeletal: Negative for arthralgias.  Skin:       Soft tissue mass  Neurological: Negative for tremors, numbness and headaches.  Psychiatric/Behavioral: Negative for dysphoric mood.    Patient Active Problem List   Diagnosis Date Noted  . Radial scar of left breast   . Abnormal breast biopsy 09/01/2019  . Myalgia due to statin 08/13/2019  . Cataracts, both eyes 04/12/2019  . Carpal tunnel syndrome of right wrist 12/26/2018  . Type II diabetes mellitus with complication ([place]) 99991111  . Knee instability, left 12/24/2017  . History of colonic polyps 12/16/2016  . Essential hypertension 12/11/2016  . Mixed hyperlipidemia 12/11/2016  . Gastroesophageal reflux disease 12/11/2016  . Kidney stones 12/11/2016  . Osteopenia 12/11/2016  . Multinodular goiter 06/09/2016    Allergies  Allergen Reactions  .  Esomeprazole Magnesium Other (See Comments)    Other Reaction: GI UPSET  . Rosuvastatin Other (See Comments)    Joint and muscle pain    Past Surgical History:  Procedure Laterality Date  . ABDOMINAL HYSTERECTOMY     Partial - Still have ovaries. - Cyst on uterus   . BREAST BIOPSY Left 07/20/2019   sclerosing lesion  . BREAST LUMPECTOMY WITH NEEDLE LOCALIZATION Left 09/29/2019   Procedure: BREAST  LUMPECTOMY WITH NEEDLE LOCALIZATION;  Surgeon: Olean Ree, MD;  Location: [facility] ORS;  Service: General;  Laterality: Left;  . BREAST SURGERY     biopsy left breast  . COLONOSCOPY  02/04/2018   one polyp -   . DILATION AND CURETTAGE OF UTERUS    . fibroid biopsy    . TONSILLECTOMY      Social History   Tobacco Use  . Smoking status: Never Smoker  . Smokeless tobacco: Never Used  Substance Use Topics  . Alcohol use: No  . Drug use: No     Medication list has been reviewed and updated.  Current Meds  Medication Sig  . aspirin EC 81 MG tablet Take 81 mg by mouth daily.  Marland Kitchen Besifloxacin HCl (BESIVANCE) 0.6 % SUSP Apply to eye.  . Bromfenac Sodium (PROLENSA) 0.07 % SOLN Apply to eye.  . Cholecalciferol 25 MCG (1000 UT) tablet Take 1,000 Units by mouth daily.   . Difluprednate (DUREZOL) 0.05 % EMUL Apply to eye.  . docusate sodium (COLACE) 100 MG capsule Take 100 mg by mouth 2 (two) times daily as needed.   Marland Kitchen ibuprofen (ADVIL) 600 MG tablet Take 1 tablet (600 mg total) by mouth every 8 (eight) hours as needed for mild pain or moderate pain.  Marland Kitchen latanoprost (XALATAN) 0.005 % ophthalmic solution Place 1 drop into both eyes at bedtime.  Marland Kitchen lisinopril (ZESTRIL) 10 MG tablet TAKE 2 TABLETS (20 MG) BY MOUTH EVERY DAY  . Omega-3 Fatty Acids (FISH OIL PO) Take 2,000 mg by mouth daily.   Glory Rosebush Delica Lancets 99991111 MISC USE AS DIRECTED ONCE DAILY  . ONETOUCH ULTRA test strip USE AS DIRECTED ONCE DAILY  . Plant Sterols and Stanols (CHOLESTOFF PLUS PO) Take 2 tablets by mouth daily.     PHQ 2/9 Scores 01/04/2020 09/01/2019 07/10/2019 12/26/2018  PHQ - 2 Score 0 0 0 0  PHQ- 9 Score 0 - - -    BP Readings from Last 3 Encounters:  01/04/20 118/78  09/29/19 (!) 141/78  09/29/19 134/63    Physical Exam Vitals and nursing note reviewed.  Constitutional:      General: She is not in acute distress.    Appearance: She is well-developed.  HENT:     Head: Normocephalic and atraumatic.    Cardiovascular:     Rate and Rhythm: Normal rate and regular rhythm.     Pulses: Normal pulses.     Heart sounds: No murmur.  Pulmonary:     Effort: Pulmonary effort is normal. No respiratory distress.     Breath sounds: No wheezing or rhonchi.  Musculoskeletal:     Cervical back: Normal range of motion.     Right lower leg: No edema.     Left lower leg: No edema.  Skin:    General: Skin is warm and dry.     Findings: No rash.          Comments: 1x2 cm soft mobile sub cut mass c/w lipoma  Neurological:     General: No  focal deficit present.     Mental Status: She is alert and oriented to person, place, and time.  Psychiatric:        Behavior: Behavior normal.        Thought Content: Thought content normal.     Wt Readings from Last 3 Encounters:  01/04/20 183 lb (83 kg)  09/29/19 179 lb 14.3 oz (81.6 kg)  09/19/19 180 lb (81.6 kg)    BP 118/78   Pulse 67   Temp 97.8 F (36.6 C) (Oral)   Ht 5\' 3"  (1.6 m)   Wt 183 lb (83 kg)   SpO2 96%   BMI 32.42 kg/m   Assessment and Plan: 1. Type II diabetes mellitus with complication (HCC) Clinically stable by exam and report without s/s of hypoglycemia. DM complicated by htn. Controlled on diet alone -  - Hemoglobin [id number] - Basic metabolic panel  2. Essential hypertension Clinically stable exam with well controlled BP on lisinopril. Tolerating medications without side effects at this time. Pt to continue current regimen and low sodium diet; benefits of regular exercise as able discussed.  3. Lipoma of anterior chest wall Benign finding - can consider excision if enlarging or symptomatic.   Partially dictated using Editor, commissioning. Any errors are unintentional.  Halina Maidens, MD [facility]  01/04/2020

## 2020-01-05 LAB — BASIC METABOLIC PANEL
BUN/Creatinine Ratio: 15 (ref 12–28)
BUN: 14 mg/dL (ref 8–27)
CO2: 22 mmol/L (ref 20–29)
Calcium: 9.6 mg/dL (ref 8.7–10.3)
Chloride: 105 mmol/L (ref 96–106)
Creatinine, Ser: 0.93 mg/dL (ref 0.57–1.00)
GFR calc Af Amer: 70 mL/min/{1.73_m2} (ref >59)
GFR calc non Af Amer: 60 mL/min/{1.73_m2} (ref >59)
Glucose: 105 mg/dL — ABNORMAL HIGH (ref 65–99)
Potassium: 4.7 mmol/L (ref 3.5–5.2)
Sodium: 137 mmol/L (ref 134–144)

## 2020-01-05 LAB — HEMOGLOBIN A1C
Est. average glucose Bld gHb Est-mCnc: 131 mg/dL
Hgb A1c MFr Bld: 6.2 % — ABNORMAL HIGH (ref 4.8–5.6)

## 2020-01-23 DIAGNOSIS — Z961 Presence of intraocular lens: Secondary | ICD-10-CM | POA: Diagnosis not present

## 2020-01-23 DIAGNOSIS — Z9842 Cataract extraction status, left eye: Secondary | ICD-10-CM | POA: Diagnosis not present

## 2020-01-23 DIAGNOSIS — Z9841 Cataract extraction status, right eye: Secondary | ICD-10-CM | POA: Diagnosis not present

## 2020-01-23 DIAGNOSIS — H40053 Ocular hypertension, bilateral: Secondary | ICD-10-CM | POA: Diagnosis not present

## 2020-01-23 DIAGNOSIS — H401131 Primary open-angle glaucoma, bilateral, mild stage: Secondary | ICD-10-CM | POA: Diagnosis not present

## 2020-01-23 LAB — HM DIABETES EYE EXAM

## 2020-04-25 DIAGNOSIS — H40053 Ocular hypertension, bilateral: Secondary | ICD-10-CM | POA: Diagnosis not present

## 2020-04-25 DIAGNOSIS — R7309 Other abnormal glucose: Secondary | ICD-10-CM | POA: Diagnosis not present

## 2020-04-25 DIAGNOSIS — Z961 Presence of intraocular lens: Secondary | ICD-10-CM | POA: Diagnosis not present

## 2020-04-25 DIAGNOSIS — H401131 Primary open-angle glaucoma, bilateral, mild stage: Secondary | ICD-10-CM | POA: Diagnosis not present

## 2020-04-25 DIAGNOSIS — Z7984 Long term (current) use of oral hypoglycemic drugs: Secondary | ICD-10-CM | POA: Diagnosis not present

## 2020-05-13 ENCOUNTER — Other Ambulatory Visit: Payer: Self-pay

## 2020-05-13 ENCOUNTER — Ambulatory Visit (INDEPENDENT_AMBULATORY_CARE_PROVIDER_SITE_OTHER): Payer: Medicare Other | Admitting: Internal Medicine

## 2020-05-13 ENCOUNTER — Encounter: Payer: Self-pay | Admitting: Internal Medicine

## 2020-05-13 VITALS — BP 118/70 | HR 70 | Ht 63.0 in | Wt 183.0 lb

## 2020-05-13 DIAGNOSIS — E118 Type 2 diabetes mellitus with unspecified complications: Secondary | ICD-10-CM

## 2020-05-13 DIAGNOSIS — E782 Mixed hyperlipidemia: Secondary | ICD-10-CM

## 2020-05-13 DIAGNOSIS — Z0000 Encounter for general adult medical examination without abnormal findings: Secondary | ICD-10-CM | POA: Diagnosis not present

## 2020-05-13 DIAGNOSIS — K219 Gastro-esophageal reflux disease without esophagitis: Secondary | ICD-10-CM

## 2020-05-13 DIAGNOSIS — I10 Essential (primary) hypertension: Secondary | ICD-10-CM | POA: Diagnosis not present

## 2020-05-13 LAB — POCT URINALYSIS DIPSTICK
Bilirubin, UA: NEGATIVE
Blood, UA: NEGATIVE
Glucose, UA: NEGATIVE
Ketones, UA: NEGATIVE
Leukocytes, UA: NEGATIVE
Nitrite, UA: NEGATIVE
Protein, UA: NEGATIVE
Spec Grav, UA: 1.02 (ref 1.010–1.025)
Urobilinogen, UA: 0.2 E.U./dL
pH, UA: 5 (ref 5.0–8.0)

## 2020-05-13 MED ORDER — ATORVASTATIN CALCIUM 10 MG PO TABS: 10.0000 mg | ORAL_TABLET | ORAL | 1 refills | Status: DC

## 2020-05-13 NOTE — Progress Notes (Signed)
Date:  05/13/2020   Name:  Dawn Fields   DOB:  1945/12/22   MRN:  [id number]   Chief Complaint: Annual Exam (Breast Exam. No pap- aged out. Foot Exam. Needs to sign release for eye exam. This morning blood sugar was 156.)  Dawn Fields is a 74 y.o. female who presents today for her Complete Annual Exam. She feels well. She reports exercising - not at this time - weather is too hot for her at this time. She reports she is sleeping well. Breast complaints - left breast incision scar can be sore at times otherwise no complaints.  Mammogram: 06/2019 - scheduled DEXA: 08/2018 osteopenia Pap smear: discontinued Colonoscopy: 01/2018  Immunization History  Administered Date(s) Administered  . Influenza, High Dose Seasonal PF 05/09/2018, 03/29/2019, 04/17/2020  . Influenza, Seasonal, Injecte, Preservative Fre 05/09/2014  . Influenza,inj,Quad PF,6+ Mos 06/02/2016  . Influenza-Unspecified 04/08/2017, 04/30/2018, 03/31/2019  . PFIZER SARS-COV-2 Vaccination 08/16/2019, 09/06/2019, 05/06/2020  . Pneumococcal Conjugate-13 11/06/2013  . Pneumococcal Polysaccharide-23 08/10/2010  . Tdap 03/07/2010  . Zoster 11/02/2012  . Zoster Recombinat (Shingrix) 05/24/2018, 07/25/2018    Hypertension This is a chronic problem. The problem is controlled. Pertinent negatives include no chest pain, headaches, palpitations or shortness of breath. Past treatments include ACE inhibitors. The current treatment provides significant improvement. There are no compliance problems.   Diabetes She presents for her follow-up diabetic visit. She has type 2 diabetes mellitus. Her disease course has been fluctuating. Pertinent negatives for hypoglycemia include no dizziness, headaches, nervousness/anxiousness or tremors. Pertinent negatives for diabetes include no chest pain, no fatigue, no polydipsia and no polyuria. She is following a generally healthy diet. She monitors blood glucose at home 1-2 x per day. Her breakfast blood  glucose is taken between 7-8 am. Her breakfast blood glucose range is generally 130-140 mg/dl. An ACE inhibitor/angiotensin II receptor blocker is being taken. Eye exam is current.    Lab Results  Component Value Date   CREATININE 0.93 01/04/2020   BUN 14 01/04/2020   NA 137 01/04/2020   K 4.7 01/04/2020   CL 105 01/04/2020   CO2 22 01/04/2020   Lab Results  Component Value Date   CHOL 226 (H) 05/11/2019   HDL 88 05/11/2019   LDLCALC 122 (H) 05/11/2019   TRIG 95 05/11/2019   CHOLHDL 2.6 05/11/2019   Lab Results  Component Value Date   TSH 1.510 05/11/2019   Lab Results  Component Value Date   HGBA1C 6.2 (H) 01/04/2020   Lab Results  Component Value Date   WBC 6.2 09/20/2019   HGB 12.2 09/20/2019   HCT 36.9 09/20/2019   MCV 89.8 09/20/2019   PLT 278 09/20/2019   Lab Results  Component Value Date   ALT 17 05/11/2019   AST 18 05/11/2019   ALKPHOS 77 05/11/2019   BILITOT 0.2 05/11/2019     Review of Systems  Constitutional: Negative for chills, fatigue and fever.  HENT: Negative for congestion, hearing loss, tinnitus, trouble swallowing and voice change.   Eyes: Negative for visual disturbance.  Respiratory: Negative for cough, chest tightness, shortness of breath and wheezing.   Cardiovascular: Negative for chest pain, palpitations and leg swelling.  Gastrointestinal: Negative for abdominal pain, constipation, diarrhea and vomiting.  Endocrine: Negative for polydipsia and polyuria.  Genitourinary: Negative for dysuria, frequency, genital sores, vaginal bleeding and vaginal discharge.  Musculoskeletal: Negative for arthralgias, gait problem and joint swelling.  Skin: Negative for color change and rash.  Neurological: Negative for dizziness,  tremors, light-headedness and headaches.  Hematological: Negative for adenopathy. Does not bruise/bleed easily.  Psychiatric/Behavioral: Negative for dysphoric mood and sleep disturbance. The patient is not nervous/anxious.       Patient Active Problem List   Diagnosis Date Noted  . Lipoma of anterior chest wall 01/04/2020  . Radial scar of left breast   . Abnormal breast biopsy 09/01/2019  . Myalgia due to statin 08/13/2019  . Cataracts, both eyes 04/12/2019  . Carpal tunnel syndrome of right wrist 12/26/2018  . Type II diabetes mellitus with complication ([place]) 73/22/0254  . Knee instability, left 12/24/2017  . History of colonic polyps 12/16/2016  . Essential hypertension 12/11/2016  . Mixed hyperlipidemia 12/11/2016  . Gastroesophageal reflux disease 12/11/2016  . Kidney stones 12/11/2016  . Osteopenia 12/11/2016  . Multinodular goiter 06/09/2016    Allergies  Allergen Reactions  . Esomeprazole Magnesium Other (See Comments)    Other Reaction: GI UPSET  . Rosuvastatin Other (See Comments)    Joint and muscle pain    Past Surgical History:  Procedure Laterality Date  . ABDOMINAL HYSTERECTOMY     Partial - Still have ovaries. - Cyst on uterus   . BREAST BIOPSY Left 07/20/2019   sclerosing lesion  . BREAST LUMPECTOMY WITH NEEDLE LOCALIZATION Left 09/29/2019   Procedure: BREAST LUMPECTOMY WITH NEEDLE LOCALIZATION;  Surgeon: Olean Ree, MD;  Location: [facility] ORS;  Service: General;  Laterality: Left;  . BREAST SURGERY     biopsy left breast  . COLONOSCOPY  02/04/2018   one polyp -   . DILATION AND CURETTAGE OF UTERUS    . fibroid biopsy    . TONSILLECTOMY      Social History   Tobacco Use  . Smoking status: Never Smoker  . Smokeless tobacco: Never Used  Vaping Use  . Vaping Use: Never used  Substance Use Topics  . Alcohol use: No  . Drug use: No     Medication list has been reviewed and updated.  Current Meds  Medication Sig  . aspirin EC 81 MG tablet Take 81 mg by mouth daily.  . Cholecalciferol 25 MCG (1000 UT) tablet Take 1,000 Units by mouth daily.   Marland Kitchen docusate sodium (COLACE) 100 MG capsule Take 100 mg by mouth 2 (two) times daily as needed.   . latanoprost (XALATAN)  0.005 % ophthalmic solution Place 1 drop into both eyes at bedtime.  Marland Kitchen lisinopril (ZESTRIL) 10 MG tablet TAKE 2 TABLETS (20 MG) BY MOUTH EVERY DAY  . Omega-3 Fatty Acids (FISH OIL PO) Take 2,000 mg by mouth daily.   Glory Rosebush Delica Lancets 27C MISC USE AS DIRECTED ONCE DAILY  . ONETOUCH ULTRA test strip USE AS DIRECTED ONCE DAILY  . Plant Sterols and Stanols (CHOLESTOFF PLUS PO) Take 2 tablets by mouth daily. PRN    PHQ 2/9 Scores 05/13/2020 01/04/2020 09/01/2019 07/10/2019  PHQ - 2 Score 0 0 0 0  PHQ- 9 Score 0 0 - -    GAD 7 : Generalized Anxiety Score 05/13/2020 01/04/2020  Nervous, Anxious, on Edge 0 0  Control/stop worrying 0 0  Worry too much - different things 0 0  Trouble relaxing 0 0  Restless 0 0  Easily annoyed or irritable 0 0  Afraid - awful might happen 0 0  Total GAD 7 Score 0 0  Anxiety Difficulty Not difficult at all Not difficult at all    BP Readings from Last 3 Encounters:  05/13/20 118/70  01/04/20 118/78  09/29/19 Marland Kitchen)  141/78    Physical Exam Vitals and nursing note reviewed.  Constitutional:      General: She is not in acute distress.    Appearance: She is well-developed.  HENT:     Head: Normocephalic and atraumatic.     Right Ear: Tympanic membrane and ear canal normal.     Left Ear: Tympanic membrane and ear canal normal.     Nose:     Right Sinus: No maxillary sinus tenderness.     Left Sinus: No maxillary sinus tenderness.  Eyes:     General: No scleral icterus.       Right eye: No discharge.        Left eye: No discharge.     Conjunctiva/sclera: Conjunctivae normal.  Neck:     Thyroid: No thyromegaly.     Vascular: No carotid bruit.  Cardiovascular:     Rate and Rhythm: Normal rate and regular rhythm.     Pulses: Normal pulses.     Heart sounds: Normal heart sounds.  Pulmonary:     Effort: Pulmonary effort is normal. No respiratory distress.     Breath sounds: No wheezing.  Chest:     Breasts:        Right: No mass, nipple  discharge, skin change or tenderness.        Left: No mass, nipple discharge, skin change or tenderness.       Comments: Healed scar Abdominal:     General: Bowel sounds are normal.     Palpations: Abdomen is soft.     Tenderness: There is no abdominal tenderness.  Musculoskeletal:     Cervical back: Normal range of motion. No erythema.     Right lower leg: No edema.     Left lower leg: No edema.  Lymphadenopathy:     Cervical: No cervical adenopathy.  Skin:    General: Skin is warm and dry.     Findings: No rash.  Neurological:     Mental Status: She is alert and oriented to person, place, and time.     Cranial Nerves: No cranial nerve deficit.     Sensory: No sensory deficit.     Deep Tendon Reflexes: Reflexes are normal and symmetric.  Psychiatric:        Attention and Perception: Attention normal.        Mood and Affect: Mood normal.     Wt Readings from Last 3 Encounters:  05/13/20 183 lb (83 kg)  01/04/20 183 lb (83 kg)  09/29/19 179 lb 14.3 oz (81.6 kg)    BP 118/70   Pulse 70   Ht 5\' 3"  (1.6 m)   Wt 183 lb (83 kg)   SpO2 98%   BMI 32.42 kg/m   Assessment and Plan: 1. Annual physical exam Normal exam Continue ASA 81 mg per day Continue healthy diet, begin regular exercise - POCT urinalysis dipstick  2. Essential hypertension Clinically stable exam with well controlled BP on current regimen of lisinopril . Tolerating medications without side effects at this time. Pt to continue current regimen and low sodium diet; benefits of regular exercise as able discussed. - Comprehensive metabolic panel - TSH  3. Gastroesophageal reflux disease without esophagitis No red flag signs such as weight loss, n/v, melena - CBC with Differential/Platelet  4. Type II diabetes mellitus with complication (HCC) Clinically stable by exam and report without s/s of hypoglycemia. DM complicated by HTN Higher readings at home may be due to dried fruit consumption -  will  check labs and advise if medication is needed Doing well with diet but needs to exercise - Hemoglobin A1c  5. Mixed hyperlipidemia Had severe myalgia to crestor but would be willing to try once or twice a week lipitor - Lipid panel - atorvastatin (LIPITOR) 10 MG tablet; Take 1 tablet (10 mg total) by mouth 2 (two) times a week.  Dispense: 24 tablet; Refill: 1   Partially dictated using Editor, commissioning. Any errors are unintentional.  Halina Maidens, MD [facility]  05/13/2020

## 2020-05-14 LAB — LIPID PANEL
Chol/HDL Ratio: 2.8 ratio (ref 0.0–4.4)
Cholesterol, Total: 227 mg/dL — ABNORMAL HIGH (ref 100–199)
HDL: 81 mg/dL (ref >39)
LDL Chol Calc (NIH): 127 mg/dL — ABNORMAL HIGH (ref 0–99)
Triglycerides: 109 mg/dL (ref 0–149)
VLDL Cholesterol Cal: 19 mg/dL (ref 5–40)

## 2020-05-14 LAB — CBC WITH DIFFERENTIAL/PLATELET
Basophils Absolute: 0 10*3/uL (ref 0.0–0.2)
Basos: 1 %
EOS (ABSOLUTE): 0.2 10*3/uL (ref 0.0–0.4)
Eos: 3 %
Hematocrit: 37 % (ref 34.0–46.6)
Hemoglobin: 12.2 g/dL (ref 11.1–15.9)
Immature Grans (Abs): 0 10*3/uL (ref 0.0–0.1)
Immature Granulocytes: 0 %
Lymphocytes Absolute: 2.7 10*3/uL (ref 0.7–3.1)
Lymphs: 47 %
MCH: 28.7 pg (ref 26.6–33.0)
MCHC: 33 g/dL (ref 31.5–35.7)
MCV: 87 fL (ref 79–97)
Monocytes Absolute: 0.4 10*3/uL (ref 0.1–0.9)
Monocytes: 7 %
Neutrophils Absolute: 2.4 10*3/uL (ref 1.4–7.0)
Neutrophils: 42 %
Platelets: 292 10*3/uL (ref 150–450)
RBC: 4.25 x10E6/uL (ref 3.77–5.28)
RDW: 13.1 % (ref 11.7–15.4)
WBC: 5.7 10*3/uL (ref 3.4–10.8)

## 2020-05-14 LAB — HEMOGLOBIN A1C
Est. average glucose Bld gHb Est-mCnc: 137 mg/dL
Hgb A1c MFr Bld: 6.4 % — ABNORMAL HIGH (ref 4.8–5.6)

## 2020-05-14 LAB — COMPREHENSIVE METABOLIC PANEL
ALT: 30 IU/L (ref 0–32)
AST: 24 IU/L (ref 0–40)
Albumin/Globulin Ratio: 1.6 (ref 1.2–2.2)
Albumin: 4.5 g/dL (ref 3.7–4.7)
Alkaline Phosphatase: 84 IU/L (ref 44–121)
BUN/Creatinine Ratio: 18 (ref 12–28)
BUN: 18 mg/dL (ref 8–27)
Bilirubin Total: 0.3 mg/dL (ref 0.0–1.2)
CO2: 22 mmol/L (ref 20–29)
Calcium: 9.9 mg/dL (ref 8.7–10.3)
Chloride: 99 mmol/L (ref 96–106)
Creatinine, Ser: 0.98 mg/dL (ref 0.57–1.00)
GFR calc Af Amer: 65 mL/min/{1.73_m2} (ref >59)
GFR calc non Af Amer: 57 mL/min/{1.73_m2} — ABNORMAL LOW (ref >59)
Globulin, Total: 2.9 g/dL (ref 1.5–4.5)
Glucose: 125 mg/dL — ABNORMAL HIGH (ref 65–99)
Potassium: 4.7 mmol/L (ref 3.5–5.2)
Sodium: 136 mmol/L (ref 134–144)
Total Protein: 7.4 g/dL (ref 6.0–8.5)

## 2020-05-14 LAB — TSH: TSH: 1.17 u[IU]/mL (ref 0.450–4.500)

## 2020-05-24 ENCOUNTER — Encounter: Payer: Self-pay | Admitting: Internal Medicine

## 2020-06-12 ENCOUNTER — Other Ambulatory Visit: Payer: Self-pay | Admitting: Internal Medicine

## 2020-06-12 DIAGNOSIS — I10 Essential (primary) hypertension: Secondary | ICD-10-CM

## 2020-06-12 NOTE — Telephone Encounter (Signed)
Requested Prescriptions  Pending Prescriptions Disp Refills  . lisinopril (ZESTRIL) 10 MG tablet [Pharmacy Med Name: LISINOPRIL 10 MG TABLET] 180 tablet 1    Sig: TAKE 2 TABLETS (20 MG) BY MOUTH EVERY DAY     Cardiovascular:  ACE Inhibitors Passed - 06/12/2020  1:21 AM      Passed - Cr in normal range and within 180 days    Creatinine, Ser  Date Value Ref Range Status  05/13/2020 0.98 0.57 - 1.00 mg/dL Final         Passed - K in normal range and within 180 days    Potassium  Date Value Ref Range Status  05/13/2020 4.7 3.5 - 5.2 mmol/L Final         Passed - Patient is not pregnant      Passed - Last BP in normal range    BP Readings from Last 1 Encounters:  05/13/20 118/70         Passed - Valid encounter within last 6 months    Recent Outpatient Visits          1 month ago Annual physical exam   [facility] Glean Hess, MD   5 months ago Type II diabetes mellitus with complication [facility])   [facility] Glean Hess, MD   9 months ago Essential hypertension   [facility] Glean Hess, MD   1 year ago Annual physical exam   [facility] Glean Hess, MD   1 year ago Essential hypertension   [facility] Glean Hess, MD      Future Appointments            In 3 months Army Melia Jesse Sans, MD [facility], [place]   In 11 months Army Melia Jesse Sans, MD [facility], [facility]

## 2020-06-17 ENCOUNTER — Other Ambulatory Visit: Payer: Self-pay

## 2020-06-17 ENCOUNTER — Ambulatory Visit
Admission: RE | Admit: 2020-06-17 | Discharge: 2020-06-17 | Disposition: A | Discharge status: Home / Self Care | Payer: Medicare Other | Source: Ambulatory Visit | Attending: Internal Medicine | Admitting: Internal Medicine

## 2020-06-17 DIAGNOSIS — Z1231 Encounter for screening mammogram for malignant neoplasm of breast: Secondary | ICD-10-CM

## 2020-06-24 ENCOUNTER — Encounter: Payer: Self-pay | Admitting: Surgery

## 2020-06-24 ENCOUNTER — Other Ambulatory Visit: Payer: Self-pay

## 2020-06-24 ENCOUNTER — Ambulatory Visit (INDEPENDENT_AMBULATORY_CARE_PROVIDER_SITE_OTHER): Payer: Medicare Other | Admitting: Surgery

## 2020-06-24 VITALS — BP 126/79 | HR 79 | Temp 98.3°F | Ht 63.0 in | Wt 185.0 lb

## 2020-06-24 DIAGNOSIS — N6489 Other specified disorders of breast: Secondary | ICD-10-CM | POA: Diagnosis not present

## 2020-06-24 NOTE — Progress Notes (Signed)
06/24/2020  History of Present Illness: Dawn Fields is a 74 y.o. female s/p left breast wire localized lumpectomy on 09/29/19 for a complex sclerosing lesion.  Patient presents today for follow up.  She had her most recent mammogram on 06/17/20 which I have personally viewed.  Overall, the patient reports that she's doing well, without any complaints.  She did experience temporary discomfort a few months ago when she reached above her head quickly because of a falling object that she caught.  She reports some occasional tugging/pulling sensation on the left side.  Past Medical History: Past Medical History:  Diagnosis Date  . Allergy Pollen  . Carpal tunnel syndrome of right wrist   . Cataract Surgery Pending  . GERD (gastroesophageal reflux disease)   . History of kidney stones   . Hyperlipidemia   . Hypertension   . Multinodular goiter   . Type II diabetes mellitus with complication [facility])      Past Surgical History: Past Surgical History:  Procedure Laterality Date  . ABDOMINAL HYSTERECTOMY     Partial - Still have ovaries. - Cyst on uterus   . BREAST BIOPSY Left 07/20/2019   sclerosing lesion  . BREAST BIOPSY Left 09/29/2019   NL-neg  . BREAST LUMPECTOMY WITH NEEDLE LOCALIZATION Left 09/29/2019   Procedure: BREAST LUMPECTOMY WITH NEEDLE LOCALIZATION;  Surgeon: Olean Ree, MD;  Location: [facility] ORS;  Service: General;  Laterality: Left;  . BREAST SURGERY     biopsy left breast  . COLONOSCOPY  02/04/2018   one polyp -   . DILATION AND CURETTAGE OF UTERUS    . fibroid biopsy    . TONSILLECTOMY      Home Medications: Prior to Admission medications   Medication Sig Start Date End Date Taking? Authorizing Provider  aspirin EC 81 MG tablet Take 81 mg by mouth daily.   Yes [provider]  atorvastatin (LIPITOR) 10 MG tablet Take 1 tablet (10 mg total) by mouth 2 (two) times a week. 05/13/20  Yes Glean Hess, MD  Cholecalciferol 25 MCG (1000 UT) tablet Take 1,000  Units by mouth daily.    Yes [provider]  docusate sodium (COLACE) 100 MG capsule Take 100 mg by mouth 2 (two) times daily as needed.    Yes [provider]  latanoprost (XALATAN) 0.005 % ophthalmic solution Place 1 drop into both eyes at bedtime.   Yes [provider]  lisinopril (ZESTRIL) 10 MG tablet TAKE 2 TABLETS (20 MG) BY MOUTH EVERY DAY 06/12/20  Yes Glean Hess, MD  Omega-3 Fatty Acids (FISH OIL PO) Take 2,000 mg by mouth daily.    Yes [provider]  OneTouch Delica Lancets 16W MISC USE AS DIRECTED ONCE DAILY 12/03/19  Yes Glean Hess, MD  [facility] ULTRA test strip USE AS DIRECTED ONCE DAILY 07/25/19  Yes Glean Hess, MD  Plant Sterols and Stanols (CHOLESTOFF PLUS PO) Take 2 tablets by mouth daily. PRN   Yes [provider]    Allergies: Allergies  Allergen Reactions  . Esomeprazole Magnesium Other (See Comments)    Other Reaction: GI UPSET  . Rosuvastatin Other (See Comments)    Joint and muscle pain    Review of Systems: Review of Systems  Constitutional: Negative for chills and fever.  Respiratory: Negative for shortness of breath.   Cardiovascular: Negative for chest pain.  Gastrointestinal: Negative for abdominal pain, nausea and vomiting.    Physical Exam BP 126/79   Pulse 79  Temp 98.3 F (36.8 C) (Oral)   Ht 5\' 3"  (1.6 m)   Wt 185 lb (83.9 kg)   SpO2 97%   BMI 32.77 kg/m  CONSTITUTIONAL: No acute distress HEENT:  Normocephalic, atraumatic, extraocular motion intact. RESPIRATORY:  Normal respiratory effort without pathologic use of accessory muscles. CARDIOVASCULAR:  Regular rhythm and rate. BREAST:  Left breast s/p lumpectomy in the upper outer quadrant.  Incision is well healed.  No palpable masses, skin changes, or nipple changes.  No left axillary or supraclavicular lymphadenopathy.  Right breast without any palpable masses, skin changes, or nipple changes.  No right axillary or  supraclavicular lymphadenopathy. NEUROLOGIC:  Motor and sensation is grossly normal.  Cranial nerves are grossly intact. PSYCH:  Alert and oriented to person, place and time. Affect is normal.  Labs/Imaging: Mammogram 06/17/20: FINDINGS: There are no findings suspicious for malignancy. Images were processed with CAD.  IMPRESSION: No mammographic evidence of malignancy. A result letter of this screening mammogram will be mailed directly to the patient.  RECOMMENDATION: Screening mammogram in one year. (Code:[id number])  BI-RADS CATEGORY  1: Negative.  Assessment and Plan: This is a 74 y.o. female s/p left breast lumpectomy for complex sclerosing lesion.  --Patient has healed well.  Discussed with her that the tugging/pulling sensation is from the scar tissue.  This may get better with time, but it does not mean that something is worsening. --She should continue with yearly mammograms.  At this point, given the benign pathology results, will defer future exams and imaging to her PCP Dr. Army Melia.  However, discussed with the patient that she can always call us if she has any questions or concerns in the future. --Follow up as needed.  Face-to-face time spent with the patient and care providers was 15 minutes, with more than 50% of the time spent counseling, educating, and coordinating care of the patient.     Melvyn Neth, [facility]

## 2020-06-24 NOTE — Patient Instructions (Addendum)
Follow-up with our office as needed. Please call and ask to speak with a nurse if you develop questions or concerns. Dr.Piscoya discussed with patient she may try Vitamin E oil to massage on the scar to help with healing.  Breast Self-Awareness Breast self-awareness is knowing how your breasts look and feel. Doing breast self-awareness is important. It allows you to catch a breast problem early while it is still small and can be treated. All women should do breast self-awareness, including women who have had breast implants. Tell your doctor if you notice a change in your breasts. What you need:  A mirror.  A well-lit room. How to do a breast self-exam A breast self-exam is one way to learn what is normal for your breasts and to check for changes. To do a breast self-exam: Look for changes  1. Take off all the clothes above your waist. 2. Stand in front of a mirror in a room with good lighting. 3. Put your hands on your hips. 4. Push your hands down. 5. Look at your breasts and nipples in the mirror to see if one breast or nipple looks different from the other. Check to see if: ? The shape of one breast is different. ? The size of one breast is different. ? There are wrinkles, dips, and bumps in one breast and not the other. 6. Look at each breast for changes in the skin, such as: ? Redness. ? Scaly areas. 7. Look for changes in your nipples, such as: ? Liquid around the nipples. ? Bleeding. ? Dimpling. ? Redness. ? A change in where the nipples are. Feel for changes  1. Lie on your back on the floor. 2. Feel each breast. To do this, follow these steps: ? Pick a breast to feel. ? Put the arm closest to that breast above your head. ? Use your other arm to feel the nipple area of your breast. Feel the area with the pads of your three middle fingers by making small circles with your fingers. For the first circle, press lightly. For the second circle, press harder. For the third  circle, press even harder. ? Keep making circles with your fingers at the different pressures as you move down your breast. Stop when you feel your ribs. ? Move your fingers a little toward the center of your body. ? Start making circles with your fingers again, this time going up until you reach your collarbone. ? Keep making up-and-down circles until you reach your armpit. Remember to keep using the three pressures. ? Feel the other breast in the same way. 3. Sit or stand in the tub or shower. 4. With soapy water on your skin, feel each breast the same way you did in step 2 when you were lying on the floor. Write down what you find Writing down what you find can help you remember what to tell your doctor. Write down:  What is normal for each breast.  Any changes you find in each breast, including: ? The kind of changes you find. ? Whether you have pain. ? Size and location of any lumps.  When you last had your menstrual period. General tips  Check your breasts every month.  If you are breastfeeding, the best time to check your breasts is after you feed your baby or after you use a breast pump.  If you get menstrual periods, the best time to check your breasts is 5-7 days after your menstrual period is over.  With time, you will become comfortable with the self-exam, and you will begin to know if there are changes in your breasts. Contact a doctor if you:  See a change in the shape or size of your breasts or nipples.  See a change in the skin of your breast or nipples, such as red or scaly skin.  Have fluid coming from your nipples that is not normal.  Find a lump or thick area that was not there before.  Have pain in your breasts.  Have any concerns about your breast health. Summary  Breast self-awareness includes looking for changes in your breasts, as well as feeling for changes within your breasts.  Breast self-awareness should be done in front of a mirror in a  well-lit room.  You should check your breasts every month. If you get menstrual periods, the best time to check your breasts is 5-7 days after your menstrual period is over.  Let your doctor know of any changes you see in your breasts, including changes in size, changes on the skin, pain or tenderness, or fluid from your nipples that is not normal. This information is not intended to replace advice given to you by your health care provider. Make sure you discuss any questions you have with your health care provider. Document Revised: 03/15/2018 Document Reviewed: 03/15/2018 Elsevier Patient Education  [place].

## 2020-07-10 ENCOUNTER — Other Ambulatory Visit: Payer: Self-pay

## 2020-07-10 ENCOUNTER — Ambulatory Visit (INDEPENDENT_AMBULATORY_CARE_PROVIDER_SITE_OTHER): Payer: Medicare Other

## 2020-07-10 VITALS — BP 120/78 | HR 68 | Temp 98.1°F | Resp 16 | Ht 63.0 in | Wt 185.6 lb

## 2020-07-10 DIAGNOSIS — Z0000 Encounter for general adult medical examination without abnormal findings: Secondary | ICD-10-CM | POA: Diagnosis not present

## 2020-07-10 DIAGNOSIS — Z780 Asymptomatic menopausal state: Secondary | ICD-10-CM

## 2020-07-10 NOTE — Patient Instructions (Addendum)
Ms. Dawn Fields , Thank you for taking time to come for your Medicare Wellness Visit. I appreciate your ongoing commitment to your health goals. Please review the following plan we discussed and let me know if I can assist you in the future.   Screening recommendations/referrals: Colonoscopy: done 02/04/18 Mammogram: done 06/17/20 Bone Density: done 09/06/18. Please call [phone] to schedule your bone density screening.  Recommended yearly ophthalmology/optometry visit for glaucoma screening and checkup Recommended yearly dental visit for hygiene and checkup  Vaccinations: Influenza vaccine: done 04/17/20 Pneumococcal vaccine: done 11/06/13 Tdap vaccine: due Shingles vaccine: done 05/24/18 & 07/25/18   Covid-19:done 08/16/19, 09/06/19 & 05/06/20  Advanced directives: Please bring a copy of your health care power of attorney and living will to the office at your convenience.  Conditions/risks identified: Keep up the great work!  Next appointment: Follow up in one year for your annual wellness visit    Preventive Care 76 Years and Older, Female Preventive care refers to lifestyle choices and visits with your health care provider that can promote health and wellness. What does preventive care include?  A yearly physical exam. This is also called an annual well check.  Dental exams once or twice a year.  Routine eye exams. Ask your health care provider how often you should have your eyes checked.  Personal lifestyle choices, including:  Daily care of your teeth and gums.  Regular physical activity.  Eating a healthy diet.  Avoiding tobacco and drug use.  Limiting alcohol use.  Practicing safe sex.  Taking low-dose aspirin every day.  Taking vitamin and mineral supplements as recommended by your health care provider. What happens during an annual well check? The services and screenings done by your health care provider during your annual well check will depend on your age, overall  health, lifestyle risk factors, and family history of disease. Counseling  Your health care provider may ask you questions about your:  Alcohol use.  Tobacco use.  Drug use.  Emotional well-being.  Home and relationship well-being.  Sexual activity.  Eating habits.  History of falls.  Memory and ability to understand (cognition).  Work and work Statistician.  Reproductive health. Screening  You may have the following tests or measurements:  Height, weight, and BMI.  Blood pressure.  Lipid and cholesterol levels. These may be checked every 5 years, or more frequently if you are over 30 years old.  Skin check.  Lung cancer screening. You may have this screening every year starting at age 76 if you have a 30-pack-year history of smoking and currently smoke or have quit within the past 15 years.  Fecal occult blood test (FOBT) of the stool. You may have this test every year starting at age 76.  Flexible sigmoidoscopy or colonoscopy. You may have a sigmoidoscopy every 5 years or a colonoscopy every 10 years starting at age 76.  Hepatitis C blood test.  Hepatitis B blood test.  Sexually transmitted disease (STD) testing.  Diabetes screening. This is done by checking your blood sugar (glucose) after you have not eaten for a while (fasting). You may have this done every 1-3 years.  Bone density scan. This is done to screen for osteoporosis. You may have this done starting at age 76.  Mammogram. This may be done every 1-2 years. Talk to your health care provider about how often you should have regular mammograms. Talk with your health care provider about your test results, treatment options, and if necessary, the need for more  tests. Vaccines  Your health care provider may recommend certain vaccines, such as:  Influenza vaccine. This is recommended every year.  Tetanus, diphtheria, and acellular pertussis (Tdap, Td) vaccine. You may need a Td booster every 10  years.  Zoster vaccine. You may need this after age 76.  Pneumococcal 13-valent conjugate (PCV13) vaccine. One dose is recommended after age 76.  Pneumococcal polysaccharide (PPSV23) vaccine. One dose is recommended after age 76. Talk to your health care provider about which screenings and vaccines you need and how often you need them. This information is not intended to replace advice given to you by your health care provider. Make sure you discuss any questions you have with your health care provider. Document Released: 08/23/2015 Document Revised: 04/15/2016 Document Reviewed: 05/28/2015 Elsevier Interactive Patient Education  2017 [place] Prevention in the Home Falls can cause injuries. They can happen to people of all ages. There are many things you can do to make your home safe and to help prevent falls. What can I do on the outside of my home?  Regularly fix the edges of walkways and driveways and fix any cracks.  Remove anything that might make you trip as you walk through a door, such as a raised step or threshold.  Trim any bushes or trees on the path to your home.  Use bright outdoor lighting.  Clear any walking paths of anything that might make someone trip, such as rocks or tools.  Regularly check to see if handrails are loose or broken. Make sure that both sides of any steps have handrails.  Any raised decks and porches should have guardrails on the edges.  Have any leaves, snow, or ice cleared regularly.  Use sand or salt on walking paths during winter.  Clean up any spills in your garage right away. This includes oil or grease spills. What can I do in the bathroom?  Use night lights.  Install grab bars by the toilet and in the tub and shower. Do not use towel bars as grab bars.  Use non-skid mats or decals in the tub or shower.  If you need to sit down in the shower, use a plastic, non-slip stool.  Keep the floor dry. Clean up any water that  spills on the floor as soon as it happens.  Remove soap buildup in the tub or shower regularly.  Attach bath mats securely with double-sided non-slip rug tape.  Do not have throw rugs and other things on the floor that can make you trip. What can I do in the bedroom?  Use night lights.  Make sure that you have a light by your bed that is easy to reach.  Do not use any sheets or blankets that are too big for your bed. They should not hang down onto the floor.  Have a firm chair that has side arms. You can use this for support while you get dressed.  Do not have throw rugs and other things on the floor that can make you trip. What can I do in the kitchen?  Clean up any spills right away.  Avoid walking on wet floors.  Keep items that you use a lot in easy-to-reach places.  If you need to reach something above you, use a strong step stool that has a grab bar.  Keep electrical cords out of the way.  Do not use floor polish or wax that makes floors slippery. If you must use wax, use non-skid floor  wax.  Do not have throw rugs and other things on the floor that can make you trip. What can I do with my stairs?  Do not leave any items on the stairs.  Make sure that there are handrails on both sides of the stairs and use them. Fix handrails that are broken or loose. Make sure that handrails are as long as the stairways.  Check any carpeting to make sure that it is firmly attached to the stairs. Fix any carpet that is loose or worn.  Avoid having throw rugs at the top or bottom of the stairs. If you do have throw rugs, attach them to the floor with carpet tape.  Make sure that you have a light switch at the top of the stairs and the bottom of the stairs. If you do not have them, ask someone to add them for you. What else can I do to help prevent falls?  Wear shoes that:  Do not have high heels.  Have rubber bottoms.  Are comfortable and fit you well.  Are closed at the  toe. Do not wear sandals.  If you use a stepladder:  Make sure that it is fully opened. Do not climb a closed stepladder.  Make sure that both sides of the stepladder are locked into place.  Ask someone to hold it for you, if possible.  Clearly mark and make sure that you can see:  Any grab bars or handrails.  First and last steps.  Where the edge of each step is.  Use tools that help you move around (mobility aids) if they are needed. These include:  Canes.  Walkers.  Scooters.  Crutches.  Turn on the lights when you go into a dark area. Replace any light bulbs as soon as they burn out.  Set up your furniture so you have a clear path. Avoid moving your furniture around.  If any of your floors are uneven, fix them.  If there are any pets around you, be aware of where they are.  Review your medicines with your doctor. Some medicines can make you feel dizzy. This can increase your chance of falling. Ask your doctor what other things that you can do to help prevent falls. This information is not intended to replace advice given to you by your health care provider. Make sure you discuss any questions you have with your health care provider. Document Released: 05/23/2009 Document Revised: 01/02/2016 Document Reviewed: 08/31/2014 Elsevier Interactive Patient Education  2017 [employer].

## 2020-07-10 NOTE — Progress Notes (Signed)
Subjective:   Dawn Fields is a 74 y.o. female who presents for Medicare Annual (Subsequent) preventive examination.  Review of Systems     Cardiac Risk Factors include: advanced age (>60men, >53 women);diabetes mellitus;dyslipidemia;hypertension;obesity (BMI >30kg/m2)     Objective:    Today's Vitals   07/10/20 0850  BP: 120/78  Pulse: 68  Resp: 16  Temp: 98.1 F (36.7 C)  TempSrc: Oral  SpO2: 98%  Weight: 185 lb 9.6 oz (84.2 kg)  Height: 5\' 3"  (1.6 m)  PainSc: 0-No pain   Body mass index is 32.88 kg/m.  Advanced Directives 07/10/2020 09/19/2019 07/10/2019 06/29/2018 12/16/2016 05/20/2016  Does Patient Have a Medical Advance Directive? Yes Yes Yes Yes Yes No  Type of Paramedic of [place];Living will [place];Living will [place];Living will [place];Living will Living will -  Does patient want to make changes to medical advance directive? - No - Patient declined No - Patient declined - - -  Copy of [place] in Chart? No - copy requested No - copy requested No - copy requested No - copy requested - -  Would patient like information on creating a medical advance directive? - - - - - No - patient declined information    Current Medications (verified) Outpatient Encounter Medications as of 07/10/2020  Medication Sig  . aspirin EC 81 MG tablet Take 81 mg by mouth daily.  Marland Kitchen atorvastatin (LIPITOR) 10 MG tablet Take 1 tablet (10 mg total) by mouth 2 (two) times a week.  . Cholecalciferol 25 MCG (1000 UT) tablet Take 1,000 Units by mouth daily.   Marland Kitchen docusate sodium (COLACE) 100 MG capsule Take 100 mg by mouth 2 (two) times daily as needed.   . latanoprost (XALATAN) 0.005 % ophthalmic solution Place 1 drop into both eyes at bedtime.  Marland Kitchen lisinopril (ZESTRIL) 10 MG tablet TAKE 2 TABLETS (20 MG) BY MOUTH EVERY DAY  . Omega-3 Fatty Acids (FISH OIL PO) Take 2,000 mg by mouth daily.   Glory Rosebush Delica Lancets 94W MISC USE AS DIRECTED ONCE DAILY  . ONETOUCH ULTRA test strip USE AS DIRECTED ONCE DAILY  . Plant Sterols and Stanols (CHOLESTOFF PLUS PO) Take 2 tablets by mouth daily. PRN (Patient not taking: Reported on 07/10/2020)   No facility-administered encounter medications on file as of 07/10/2020.    Allergies (verified) Esomeprazole magnesium and Rosuvastatin   History: Past Medical History:  Diagnosis Date  . Allergy Pollen  . Carpal tunnel syndrome of right wrist   . Cataract Surgery Pending  . GERD (gastroesophageal reflux disease)   . History of kidney stones   . Hyperlipidemia   . Hypertension   . Kidney stones   . Multinodular goiter   . Type II diabetes mellitus with complication [facility])    Past Surgical History:  Procedure Laterality Date  . ABDOMINAL HYSTERECTOMY     Partial - Still have ovaries. - Cyst on uterus   . BREAST BIOPSY Left 07/20/2019   sclerosing lesion  . BREAST BIOPSY Left 09/29/2019   NL-neg  . BREAST LUMPECTOMY WITH NEEDLE LOCALIZATION Left 09/29/2019   Procedure: BREAST LUMPECTOMY WITH NEEDLE LOCALIZATION;  Surgeon: Olean Ree, MD;  Location: [facility] ORS;  Service: General;  Laterality: Left;  . BREAST SURGERY     biopsy left breast  . COLONOSCOPY  02/04/2018   one polyp -   . DILATION AND CURETTAGE OF UTERUS    . fibroid biopsy    .  TONSILLECTOMY     Family History  Problem Relation Age of Onset  . Stroke Mother   . Hypertension Mother   . Liver cancer Father   . Cancer Father   . COPD Sister   . Diabetes Sister   . Stroke Sister   . Diabetes Brother   . Stroke Brother   . Hearing loss Brother   . Diabetes Maternal Grandmother   . Diabetes Maternal Grandfather   . Diabetes Sister   . Mental illness Sister   . Breast cancer Maternal Aunt   . Breast cancer Cousin        mat cousin   Social History   Socioeconomic History  . Marital status: Single    Spouse name: Not on file  . Number of children: 1  .  Years of education: Not on file  . Highest education level: Associate degree: academic program  Occupational History  . Occupation: retired    Comment: Theatre manager  Tobacco Use  . Smoking status: Never Smoker  . Smokeless tobacco: Never Used  Vaping Use  . Vaping Use: Never used  Substance and Sexual Activity  . Alcohol use: No  . Drug use: No  . Sexual activity: Not Currently    Birth control/protection: Other-see comments    Comment: Age  Other Topics Concern  . Not on file  Social History Narrative  . Not on file   Social Determinants of Health   Financial Resource Strain: Low Risk   . Difficulty of Paying Living Expenses: Not hard at all  Food Insecurity: No Food Insecurity  . Worried About Charity fundraiser in the Last Year: Never true  . Ran Out of Food in the Last Year: Never true  Transportation Needs: No Transportation Needs  . Lack of Transportation (Medical): No  . Lack of Transportation (Non-Medical): No  Physical Activity: Sufficiently Active  . Days of Exercise per Week: 3 days  . Minutes of Exercise per Session: 60 min  Stress: No Stress Concern Present  . Feeling of Stress : Not at all  Social Connections: Socially Isolated  . Frequency of Communication with Friends and Family: More than three times a week  . Frequency of Social Gatherings with Friends and Family: Once a week  . Attends Religious Services: Never  . Active Member of Clubs or Organizations: No  . Attends Archivist Meetings: Never  . Marital Status: Never married    Tobacco Counseling Counseling given: Not Answered   Clinical Intake:  Pre-visit preparation completed: Yes  Pain : No/denies pain Pain Score: 0-No pain     BMI - recorded: 32.88 Nutritional Status: BMI > 30  Obese Nutritional Risks: None Diabetes: Yes CBG done?: No Did pt. bring in CBG monitor from home?: No  How often do you need to have someone help you when you read instructions,  pamphlets, or other written materials from your doctor or pharmacy?: 1 - Never  Nutrition Risk Assessment:  Has the patient had any N/V/D within the last 2 months?  No  Does the patient have any non-healing wounds?  No  Has the patient had any unintentional weight loss or weight gain?  No   Diabetes:  Is the patient diabetic?  Yes  If diabetic, was a CBG obtained today?  No  Did the patient bring in their glucometer from home?  No  How often do you monitor your CBG's? daily.   Financial Strains and Diabetes Management:  Are you  having any financial strains with the device, your supplies or your medication? No .  Does the patient want to be seen by Chronic Care Management for management of their diabetes?  No  Would the patient like to be referred to a Nutritionist or for Diabetic Management?  No   Diabetic Exams:  Diabetic Eye Exam: Completed 01/23/20 negative retinopathy.   Diabetic Foot Exam: Completed 05/13/20.   Interpreter Needed?: No  Information entered by :: Clemetine Marker LPN   Activities of Daily Living In your present state of health, do you have any difficulty performing the following activities: 07/10/2020 09/19/2019  Hearing? N -  Comment declines hearing aids -  Vision? N -  Difficulty concentrating or making decisions? N -  Walking or climbing stairs? N N  Dressing or bathing? N -  Doing errands, shopping? N N  Preparing Food and eating ? N -  Using the Toilet? N -  In the past six months, have you accidently leaked urine? N -  Do you have problems with loss of bowel control? N -  Managing your Medications? N -  Managing your Finances? N -  Housekeeping or managing your Housekeeping? N -  Some recent data might be hidden    Patient Care Team: Glean Hess, MD as PCP - General (Internal Medicine) Margaretha Sheffield, MD (Otolaryngology) Luster Landsberg, MD as Referring Physician (Gastroenterology) Stanford Breed, MD as Referring Physician  (Cardiology) Idelle Leech, OD (Optometry)  Indicate any recent Medical Services you may have received from other than [facility] providers in the past year (date may be approximate).     Assessment:   This is a routine wellness examination for [facility].  Hearing/Vision screen  Hearing Screening   125Hz  250Hz  500Hz  1000Hz  2000Hz  3000Hz  4000Hz  6000Hz  8000Hz   Right ear:           Left ear:           Comments: Pt denies hearing difficulty  Vision Screening Comments: Annual vision screenings with Dr. Matilde Sprang  Dietary issues and exercise activities discussed: Current Exercise Habits: Home exercise routine, Type of exercise: walking, Time (Minutes): > 60, Frequency (Times/Week): 3, Weekly Exercise (Minutes/Week): 0, Intensity: Moderate, Exercise limited by: None identified  Goals    . DIET - REDUCE FAT INTAKE     Patient would like to lower overall cholesterol with diet and exercise.     . Increase physical activity     Recommend increasing physical activity to 150 minutes per week    . Weight (lb) < 200 lb (90.7 kg)     Pt would like to set a goal for 5% weight loss. Continue exercise and healthy eating habits.       Depression Screen PHQ 2/9 Scores 07/10/2020 05/13/2020 01/04/2020 09/01/2019 07/10/2019 12/26/2018 06/29/2018  PHQ - 2 Score 0 0 0 0 0 0 1  PHQ- 9 Score - 0 0 - - - -    Fall Risk Fall Risk  07/10/2020 06/24/2020 05/13/2020 01/04/2020 10/13/2019  Falls in the past year? 0 0 0 0 0  Number falls in past yr: 0 0 0 0 0  Injury with Fall? 0 0 0 0 0  Risk for fall due to : No Fall Risks - No Fall Risks No Fall Risks -  Follow up Falls prevention discussed - Falls evaluation completed Falls evaluation completed -    Any stairs in or around the home? Yes  If so, are there any without handrails? No  Home free of loose throw rugs in walkways, pet beds, electrical cords, etc? Yes  Adequate lighting in your home to reduce risk of falls? Yes   ASSISTIVE DEVICES UTILIZED TO PREVENT  FALLS:  Life alert? No  Use of a cane, walker or w/c? No  Grab bars in the bathroom? No  Shower chair or bench in shower? No  Elevated toilet seat or a handicapped toilet? No   TIMED UP AND GO:  Was the test performed? Yes .  Length of time to ambulate 10 feet: 4 sec.   Gait steady and fast without use of assistive device  Cognitive Function: Normal cognitive status assessed by direct observation by this Nurse Health Advisor.       6CIT Screen 07/10/2019 06/23/2017  What Year? 0 points 0 points  What month? 0 points 0 points  What time? 0 points 0 points  Count back from 20 0 points 0 points  Months in reverse 0 points 0 points  Repeat phrase 0 points 0 points  Total Score 0 0    Immunizations Immunization History  Administered Date(s) Administered  . Influenza, High Dose Seasonal PF 05/09/2018, 03/29/2019, 04/17/2020  . Influenza, Seasonal, Injecte, Preservative Fre 05/09/2014  . Influenza,inj,Quad PF,6+ Mos 06/02/2016  . Influenza-Unspecified 04/08/2017, 04/30/2018, 03/31/2019  . PFIZER SARS-COV-2 Vaccination 08/16/2019, 09/06/2019, 05/06/2020  . Pneumococcal Conjugate-13 11/06/2013  . Pneumococcal Polysaccharide-23 08/10/2010  . Tdap 03/07/2010  . Zoster 11/02/2012  . Zoster Recombinat (Shingrix) 05/24/2018, 07/25/2018    TDAP status: Due, Education has been provided regarding the importance of this vaccine. Advised may receive this vaccine at local pharmacy or Health Dept. Aware to provide a copy of the vaccination record if obtained from local pharmacy or Health Dept. Verbalized acceptance and understanding.   Flu Vaccine status: Up to date   Pneumococcal vaccine status: Up to date   Covid-19 vaccine status: Completed vaccines    Qualifies for Shingles Vaccine? Yes   Zostavax completed Yes   Shingrix Completed?: Yes  Screening Tests Health Maintenance  Topic Date Due  . TETANUS/TDAP  03/07/2020  . HEMOGLOBIN A1C  11/11/2020  . OPHTHALMOLOGY EXAM   01/22/2021  . FOOT EXAM  05/13/2021  . MAMMOGRAM  06/17/2021  . INFLUENZA VACCINE  Completed  . DEXA SCAN  Completed  . COVID-19 Vaccine  Completed  . Hepatitis C Screening  Completed  . PNA vac Low Risk Adult  Completed    Health Maintenance  Health Maintenance Due  Topic Date Due  . TETANUS/TDAP  03/07/2020    Colorectal cancer screening: Completed 02/04/18. Repeat every 5 years   Mammogram status: Completed 06/17/20. Repeat every year   Bone Density status: Completed 09/06/18. Results reflect: Bone density results: OSTEOPENIA. Repeat every 2 years. Ordered today.   Lung Cancer Screening: (Low Dose CT Chest recommended if Age 48-80 years, 30 pack-year currently smoking OR have quit w/in 15years.) does not qualify.   Additional Screening:  Hepatitis C Screening: does qualify; Completed 12/04/13  Vision Screening: Recommended annual ophthalmology exams for early detection of glaucoma and other disorders of the eye. Is the patient up to date with their annual eye exam?  Yes  Who is the provider or what is the name of the office in which the patient attends annual eye exams? Dr. Matilde Sprang  Dental Screening: Recommended annual dental exams for proper oral hygiene  Community Resource Referral / Chronic Care Management: CRR required this visit?  No   CCM required this visit?  No  Plan:     I have personally reviewed and noted the following in the patient's chart:   . Medical and social history . Use of alcohol, tobacco or illicit drugs  . Current medications and supplements . Functional ability and status . Nutritional status . Physical activity . Advanced directives . List of other physicians . Hospitalizations, surgeries, and ER visits in previous 12 months . Vitals . Screenings to include cognitive, depression, and falls . Referrals and appointments  In addition, I have reviewed and discussed with patient certain preventive protocols, quality metrics, and best  practice recommendations. A written personalized care plan for preventive services as well as general preventive health recommendations were provided to patient.     Clemetine Marker, LPN   46/03/320   Nurse Notes: pt doing well and appreciative of visit today

## 2020-07-12 ENCOUNTER — Other Ambulatory Visit: Payer: Self-pay | Admitting: Internal Medicine

## 2020-07-24 DIAGNOSIS — Z961 Presence of intraocular lens: Secondary | ICD-10-CM | POA: Diagnosis not present

## 2020-07-24 DIAGNOSIS — H401131 Primary open-angle glaucoma, bilateral, mild stage: Secondary | ICD-10-CM | POA: Diagnosis not present

## 2020-07-24 DIAGNOSIS — Z7984 Long term (current) use of oral hypoglycemic drugs: Secondary | ICD-10-CM | POA: Diagnosis not present

## 2020-07-24 DIAGNOSIS — H40053 Ocular hypertension, bilateral: Secondary | ICD-10-CM | POA: Diagnosis not present

## 2020-07-24 DIAGNOSIS — R7309 Other abnormal glucose: Secondary | ICD-10-CM | POA: Diagnosis not present

## 2020-08-05 ENCOUNTER — Inpatient Hospital Stay: Admission: RE | Admit: 2020-08-05 | Payer: Medicare Other | Source: Ambulatory Visit

## 2020-08-28 ENCOUNTER — Other Ambulatory Visit: Payer: Self-pay

## 2020-08-28 ENCOUNTER — Ambulatory Visit
Admission: RE | Admit: 2020-08-28 | Discharge: 2020-08-28 | Disposition: A | Discharge status: Home / Self Care | Payer: Medicare Other | Source: Ambulatory Visit | Attending: Internal Medicine | Admitting: Internal Medicine

## 2020-08-28 DIAGNOSIS — R29890 Loss of height: Secondary | ICD-10-CM | POA: Diagnosis not present

## 2020-08-28 DIAGNOSIS — Z780 Asymptomatic menopausal state: Secondary | ICD-10-CM | POA: Diagnosis not present

## 2020-08-28 DIAGNOSIS — M8589 Other specified disorders of bone density and structure, multiple sites: Secondary | ICD-10-CM | POA: Diagnosis not present

## 2020-09-26 ENCOUNTER — Other Ambulatory Visit: Payer: Self-pay

## 2020-09-26 ENCOUNTER — Encounter: Payer: Self-pay | Admitting: Internal Medicine

## 2020-09-26 ENCOUNTER — Ambulatory Visit (INDEPENDENT_AMBULATORY_CARE_PROVIDER_SITE_OTHER): Payer: Medicare Other | Admitting: Internal Medicine

## 2020-09-26 VITALS — BP 120/84 | HR 97 | Temp 97.9°F | Ht 63.0 in | Wt 188.0 lb

## 2020-09-26 DIAGNOSIS — I10 Essential (primary) hypertension: Secondary | ICD-10-CM

## 2020-09-26 DIAGNOSIS — D170 Benign lipomatous neoplasm of skin and subcutaneous tissue of head, face and neck: Secondary | ICD-10-CM | POA: Diagnosis not present

## 2020-09-26 DIAGNOSIS — E118 Type 2 diabetes mellitus with unspecified complications: Secondary | ICD-10-CM | POA: Diagnosis not present

## 2020-09-26 LAB — POCT GLYCOSYLATED HEMOGLOBIN (HGB A1C): Hemoglobin A1C: 6.9 % — AB (ref 4.0–5.6)

## 2020-09-26 NOTE — Progress Notes (Signed)
Date:  09/26/2020   Name:  Dawn Fields   DOB:  12/04/1945   MRN:  [id number]   Chief Complaint: Diabetes (Last reading 171 this morning )  Diabetes She presents for her follow-up diabetic visit. She has type 2 diabetes mellitus. Her disease course has been stable. Pertinent negatives for hypoglycemia include no headaches or tremors. Pertinent negatives for diabetes include no chest pain, no fatigue, no polydipsia and no polyuria. Symptoms are stable. She monitors blood glucose at home 1-2 x per day. Her breakfast blood glucose is taken between 6-7 am. Her breakfast blood glucose range is generally 130-140 mg/dl. (Today 171 - the highest) An ACE inhibitor/angiotensin II receptor blocker is being taken.  Hypertension This is a chronic problem. The problem is controlled. Pertinent negatives include no chest pain, headaches, palpitations or shortness of breath. Past treatments include ACE inhibitors.    Lab Results  Component Value Date   CREATININE 0.98 05/13/2020   BUN 18 05/13/2020   NA 136 05/13/2020   K 4.7 05/13/2020   CL 99 05/13/2020   CO2 22 05/13/2020   Lab Results  Component Value Date   CHOL 227 (H) 05/13/2020   HDL 81 05/13/2020   LDLCALC 127 (H) 05/13/2020   TRIG 109 05/13/2020   CHOLHDL 2.8 05/13/2020   Lab Results  Component Value Date   TSH 1.170 05/13/2020   Lab Results  Component Value Date   HGBA1C 6.4 (H) 05/13/2020   This SmartLink has not been configured with any valid records.   Lab Results  Component Value Date   WBC 5.7 05/13/2020   HGB 12.2 05/13/2020   HCT 37.0 05/13/2020   MCV 87 05/13/2020   PLT 292 05/13/2020   Lab Results  Component Value Date   ALT 30 05/13/2020   AST 24 05/13/2020   ALKPHOS 84 05/13/2020   BILITOT 0.3 05/13/2020     Review of Systems  Constitutional: Negative for appetite change, fatigue, fever and unexpected weight change (gained a few pounds over the holidays).  HENT: Negative for tinnitus and trouble  swallowing.   Eyes: Negative for visual disturbance.  Respiratory: Negative for cough, chest tightness and shortness of breath.   Cardiovascular: Positive for leg swelling (ankle edema). Negative for chest pain and palpitations.  Gastrointestinal: Negative for abdominal pain.  Endocrine: Negative for polydipsia and polyuria.  Genitourinary: Negative for dysuria and hematuria.  Musculoskeletal: Negative for arthralgias.  Neurological: Negative for tremors, numbness and headaches.  Psychiatric/Behavioral: Negative for dysphoric mood.    Patient Active Problem List   Diagnosis Date Noted  . Lipoma of anterior chest wall 01/04/2020  . Radial scar of left breast   . Abnormal breast biopsy 09/01/2019  . Myalgia due to statin 08/13/2019  . Cataracts, both eyes 04/12/2019  . Carpal tunnel syndrome of right wrist 12/26/2018  . Type II diabetes mellitus with complication ([place]) 23/76/2831  . Knee instability, left 12/24/2017  . History of colonic polyps 12/16/2016  . Essential hypertension 12/11/2016  . Mixed hyperlipidemia 12/11/2016  . Gastroesophageal reflux disease 12/11/2016  . Kidney stones 12/11/2016  . Osteopenia 12/11/2016  . Multinodular goiter 06/09/2016    Allergies  Allergen Reactions  . Esomeprazole Magnesium Other (See Comments)    Other Reaction: GI UPSET  . Rosuvastatin Other (See Comments)    Joint and muscle pain    Past Surgical History:  Procedure Laterality Date  . ABDOMINAL HYSTERECTOMY     Partial - Still have ovaries. - Cyst on  uterus   . BREAST BIOPSY Left 07/20/2019   sclerosing lesion  . BREAST BIOPSY Left 09/29/2019   NL-neg  . BREAST LUMPECTOMY WITH NEEDLE LOCALIZATION Left 09/29/2019   Procedure: BREAST LUMPECTOMY WITH NEEDLE LOCALIZATION;  Surgeon: Olean Ree, MD;  Location: [facility] ORS;  Service: General;  Laterality: Left;  . BREAST SURGERY     biopsy left breast  . COLONOSCOPY  02/04/2018   one polyp -   . DILATION AND CURETTAGE OF UTERUS     . fibroid biopsy    . TONSILLECTOMY      Social History   Tobacco Use  . Smoking status: Never Smoker  . Smokeless tobacco: Never Used  Vaping Use  . Vaping Use: Never used  Substance Use Topics  . Alcohol use: No  . Drug use: No     Medication list has been reviewed and updated.  Current Meds  Medication Sig  . aspirin EC 81 MG tablet Take 81 mg by mouth daily.  Marland Kitchen atorvastatin (LIPITOR) 10 MG tablet Take 1 tablet (10 mg total) by mouth 2 (two) times a week.  . Cholecalciferol 25 MCG (1000 UT) tablet Take 1,000 Units by mouth daily.   Marland Kitchen docusate sodium (COLACE) 100 MG capsule Take 100 mg by mouth 2 (two) times daily as needed.   . latanoprost (XALATAN) 0.005 % ophthalmic solution Place 1 drop into both eyes at bedtime.  Marland Kitchen lisinopril (ZESTRIL) 10 MG tablet TAKE 2 TABLETS (20 MG) BY MOUTH EVERY DAY  . Omega-3 Fatty Acids (FISH OIL PO) Take 2,000 mg by mouth daily.   Glory Rosebush Delica Lancets 95J MISC USE AS DIRECTED ONCE DAILY  . ONETOUCH ULTRA test strip USE AS DIRECTED ONCE DAILY    PHQ 2/9 Scores 09/26/2020 07/10/2020 05/13/2020 01/04/2020  PHQ - 2 Score 0 0 0 0  PHQ- 9 Score 0 - 0 0    GAD 7 : Generalized Anxiety Score 09/26/2020 05/13/2020 01/04/2020  Nervous, Anxious, on Edge 0 0 0  Control/stop worrying 0 0 0  Worry too much - different things 0 0 0  Trouble relaxing 0 0 0  Restless 0 0 0  Easily annoyed or irritable 0 0 0  Afraid - awful might happen 0 0 0  Total GAD 7 Score 0 0 0  Anxiety Difficulty - Not difficult at all Not difficult at all    BP Readings from Last 3 Encounters:  09/26/20 120/84  07/10/20 120/78  06/24/20 126/79    Physical Exam Vitals and nursing note reviewed.  Constitutional:      General: She is not in acute distress.    Appearance: She is well-developed.  HENT:     Head: Normocephalic and atraumatic.  Neck:     Vascular: No carotid bruit.  Cardiovascular:     Rate and Rhythm: Normal rate and regular rhythm.     Pulses: Normal  pulses.     Heart sounds: Normal heart sounds. No murmur heard.   Pulmonary:     Effort: Pulmonary effort is normal. No respiratory distress.  Musculoskeletal:     Cervical back: Normal range of motion.     Right lower leg: Edema present.     Left lower leg: Edema present.  Lymphadenopathy:     Cervical: No cervical adenopathy.  Skin:    General: Skin is warm and dry.     Capillary Refill: Capillary refill takes less than 2 seconds.     Findings: No rash.     Comments:  2 cm lipoma/soft mass over right trapezious  Neurological:     General: No focal deficit present.     Mental Status: She is alert and oriented to person, place, and time.  Psychiatric:        Mood and Affect: Mood normal.        Behavior: Behavior normal.     Wt Readings from Last 3 Encounters:  09/26/20 188 lb (85.3 kg)  07/10/20 185 lb 9.6 oz (84.2 kg)  06/24/20 185 lb (83.9 kg)    BP 120/84   Pulse 97   Temp 97.9 F (36.6 C) (Oral)   Ht 5\' 3"  (1.6 m)   Wt 188 lb (85.3 kg)   SpO2 97%   BMI 33.30 kg/m   Assessment and Plan: 1. Type II diabetes mellitus with complication (HCC) Clinically stable by exam and report without s/s of hypoglycemia. DM complicated by HTN. Not currently on medication but also not exercising or following a great diet. She will start back with exercise and improve her diet before the next visit - POCT glycosylated hemoglobin (Hb A1C)=6.9  2. Essential hypertension Clinically stable exam with well controlled BP on lisinopril. Tolerating medications without side effects at this time. Pt to continue current regimen and low sodium diet; benefits of regular exercise as able discussed.  3. Lipoma of neck benign   Partially dictated using Editor, commissioning. Any errors are unintentional.  Halina Maidens, MD [facility]  09/26/2020

## 2020-10-15 DIAGNOSIS — H40053 Ocular hypertension, bilateral: Secondary | ICD-10-CM | POA: Diagnosis not present

## 2020-10-15 DIAGNOSIS — Z7984 Long term (current) use of oral hypoglycemic drugs: Secondary | ICD-10-CM | POA: Diagnosis not present

## 2020-10-15 DIAGNOSIS — H401131 Primary open-angle glaucoma, bilateral, mild stage: Secondary | ICD-10-CM | POA: Diagnosis not present

## 2020-10-15 DIAGNOSIS — Z961 Presence of intraocular lens: Secondary | ICD-10-CM | POA: Diagnosis not present

## 2020-10-15 DIAGNOSIS — R7309 Other abnormal glucose: Secondary | ICD-10-CM | POA: Diagnosis not present

## 2020-10-16 ENCOUNTER — Other Ambulatory Visit: Payer: Self-pay | Admitting: Internal Medicine

## 2020-10-16 DIAGNOSIS — E782 Mixed hyperlipidemia: Secondary | ICD-10-CM

## 2020-10-16 NOTE — Telephone Encounter (Signed)
Requested Prescriptions  Pending Prescriptions Disp Refills  . atorvastatin (LIPITOR) 10 MG tablet [Pharmacy Med Name: ATORVASTATIN 10 MG TABLET] 24 tablet 1    Sig: TAKE 1 TABLET (10 MG TOTAL) BY MOUTH 2 (TWO) TIMES A WEEK.     Cardiovascular:  Antilipid - Statins Failed - 10/16/2020  3:23 AM      Failed - Total Cholesterol in normal range and within 360 days    Cholesterol, Total  Date Value Ref Range Status  05/13/2020 227 (H) 100 - 199 mg/dL Final         Failed - LDL in normal range and within 360 days    LDL Chol Calc (NIH)  Date Value Ref Range Status  05/13/2020 127 (H) 0 - 99 mg/dL Final         Passed - HDL in normal range and within 360 days    HDL  Date Value Ref Range Status  05/13/2020 81 >39 mg/dL Final         Passed - Triglycerides in normal range and within 360 days    Triglycerides  Date Value Ref Range Status  05/13/2020 109 0 - 149 mg/dL Final         Passed - Patient is not pregnant      Passed - Valid encounter within last 12 months    Recent Outpatient Visits          2 weeks ago Type II diabetes mellitus with complication [facility])   [facility] Glean Hess, MD   5 months ago Annual physical exam   [facility] Glean Hess, MD   9 months ago Type II diabetes mellitus with complication [facility])   [facility] Glean Hess, MD   1 year ago Essential hypertension   [place], Laura H, MD   1 year ago Annual physical exam   [facility] Glean Hess, MD      Future Appointments            In 3 months Army Melia Jesse Sans, MD [facility] [place]   In 7 months Army Melia, Jesse Sans, MD [facility]

## 2020-12-15 ENCOUNTER — Other Ambulatory Visit: Payer: Self-pay | Admitting: Internal Medicine

## 2020-12-15 DIAGNOSIS — I10 Essential (primary) hypertension: Secondary | ICD-10-CM

## 2020-12-15 NOTE — Telephone Encounter (Signed)
Requested medication (s) are due for refill today: yes  Requested medication (s) are on the active medication list: yes  Last refill:  06/12/20 #180 1 Rf  Future visit scheduled: yes  Notes to clinic:  overdue lab work   Requested Prescriptions  Pending Prescriptions Disp Refills   lisinopril (ZESTRIL) 10 MG tablet [Pharmacy Med Name: LISINOPRIL 10 MG TABLET] 180 tablet 1    Sig: TAKE 2 TABLETS (20 MG) BY MOUTH EVERY DAY      Cardiovascular:  ACE Inhibitors Failed - 12/15/2020  9:44 AM      Failed - Cr in normal range and within 180 days    Creatinine, Ser  Date Value Ref Range Status  05/13/2020 0.98 0.57 - 1.00 mg/dL Final          Failed - K in normal range and within 180 days    Potassium  Date Value Ref Range Status  05/13/2020 4.7 3.5 - 5.2 mmol/L Final          Passed - Patient is not pregnant      Passed - Last BP in normal range    BP Readings from Last 1 Encounters:  09/26/20 120/84          Passed - Valid encounter within last 6 months    Recent Outpatient Visits           2 months ago Type II diabetes mellitus with complication [facility])   [facility] Glean Hess, MD   7 months ago Annual physical exam   [facility] Glean Hess, MD   11 months ago Type II diabetes mellitus with complication [facility])   [facility] Glean Hess, MD   1 year ago Essential hypertension   [facility] Glean Hess, MD   1 year ago Annual physical exam   [facility] Glean Hess, MD       Future Appointments             In 1 month Army Melia Jesse Sans, MD [facility], [place]   In 5 months Army Melia, Jesse Sans, MD [facility], [facility]

## 2021-01-13 DIAGNOSIS — R7309 Other abnormal glucose: Secondary | ICD-10-CM | POA: Diagnosis not present

## 2021-01-13 DIAGNOSIS — Z961 Presence of intraocular lens: Secondary | ICD-10-CM | POA: Diagnosis not present

## 2021-01-13 DIAGNOSIS — H401131 Primary open-angle glaucoma, bilateral, mild stage: Secondary | ICD-10-CM | POA: Diagnosis not present

## 2021-01-13 DIAGNOSIS — H40053 Ocular hypertension, bilateral: Secondary | ICD-10-CM | POA: Diagnosis not present

## 2021-01-13 DIAGNOSIS — Z7984 Long term (current) use of oral hypoglycemic drugs: Secondary | ICD-10-CM | POA: Diagnosis not present

## 2021-01-13 LAB — HM DIABETES EYE EXAM

## 2021-01-14 ENCOUNTER — Encounter: Payer: Self-pay | Admitting: Internal Medicine

## 2021-01-28 ENCOUNTER — Ambulatory Visit (INDEPENDENT_AMBULATORY_CARE_PROVIDER_SITE_OTHER): Payer: Medicare Other | Admitting: Internal Medicine

## 2021-01-28 ENCOUNTER — Encounter: Payer: Self-pay | Admitting: Internal Medicine

## 2021-01-28 ENCOUNTER — Other Ambulatory Visit: Payer: Self-pay

## 2021-01-28 VITALS — BP 122/80 | HR 78 | Ht 63.0 in | Wt 184.0 lb

## 2021-01-28 DIAGNOSIS — G5601 Carpal tunnel syndrome, right upper limb: Secondary | ICD-10-CM

## 2021-01-28 DIAGNOSIS — I10 Essential (primary) hypertension: Secondary | ICD-10-CM

## 2021-01-28 DIAGNOSIS — D171 Benign lipomatous neoplasm of skin and subcutaneous tissue of trunk: Secondary | ICD-10-CM

## 2021-01-28 DIAGNOSIS — E118 Type 2 diabetes mellitus with unspecified complications: Secondary | ICD-10-CM

## 2021-01-28 LAB — POCT GLYCOSYLATED HEMOGLOBIN (HGB A1C)
Est. average glucose Bld gHb Est-mCnc: 137
Hemoglobin A1C: 6.4 % — AB (ref 4.0–5.6)

## 2021-01-28 NOTE — Progress Notes (Signed)
Date:  01/28/2021   Name:  Dawn Fields   DOB:  March 04, 1946   MRN:  [id number]   Chief Complaint: Diabetes and Hypertension  Diabetes She presents for her follow-up diabetic visit. She has type 2 diabetes mellitus. Her disease course has been stable. Pertinent negatives for hypoglycemia include no dizziness, headaches or nervousness/anxiousness. Pertinent negatives for diabetes include no chest pain, no fatigue and no weakness. Current diabetic treatment includes diet. She is compliant with treatment most of the time. An ACE inhibitor/angiotensin II receptor blocker is being taken.  Hypertension This is a chronic problem. The problem is controlled. Pertinent negatives include no chest pain, headaches, palpitations or shortness of breath. Past treatments include ACE inhibitors.  Tingling - in right fingers, wears a splint at night which helps but tingling in slowly getting worse, esp with typing, etc. Lab Results  Component Value Date   CREATININE 0.98 05/13/2020   BUN 18 05/13/2020   NA 136 05/13/2020   K 4.7 05/13/2020   CL 99 05/13/2020   CO2 22 05/13/2020   Lab Results  Component Value Date   CHOL 227 (H) 05/13/2020   HDL 81 05/13/2020   LDLCALC 127 (H) 05/13/2020   TRIG 109 05/13/2020   CHOLHDL 2.8 05/13/2020   Lab Results  Component Value Date   TSH 1.170 05/13/2020   Lab Results  Component Value Date   HGBA1C 6.4 (A) 01/28/2021   Lab Results  Component Value Date   WBC 5.7 05/13/2020   HGB 12.2 05/13/2020   HCT 37.0 05/13/2020   MCV 87 05/13/2020   PLT 292 05/13/2020   Lab Results  Component Value Date   ALT 30 05/13/2020   AST 24 05/13/2020   ALKPHOS 84 05/13/2020   BILITOT 0.3 05/13/2020     Review of Systems  Constitutional:  Negative for fatigue and unexpected weight change.  HENT:  Negative for nosebleeds.   Eyes:  Negative for visual disturbance.  Respiratory:  Negative for cough, chest tightness, shortness of breath and wheezing.    Cardiovascular:  Negative for chest pain, palpitations and leg swelling.  Gastrointestinal:  Negative for abdominal pain, constipation and diarrhea.  Genitourinary:  Negative for dysuria and urgency.  Skin:  Negative for color change and rash.  Neurological:  Positive for numbness (tingling in fingers on right hand). Negative for dizziness, weakness, light-headedness and headaches.  Psychiatric/Behavioral:  Negative for dysphoric mood and sleep disturbance. The patient is not nervous/anxious.    Patient Active Problem List   Diagnosis Date Noted   Lipoma of neck 09/26/2020   Lipoma of anterior chest wall 01/04/2020   Radial scar of left breast    Abnormal breast biopsy 09/01/2019   Myalgia due to statin 08/13/2019   Cataracts, both eyes 04/12/2019   Carpal tunnel syndrome of right wrist 12/26/2018   Type II diabetes mellitus with complication ([place]) 91/50/5697   Knee instability, left 12/24/2017   History of colonic polyps 12/16/2016   Essential hypertension 12/11/2016   Mixed hyperlipidemia 12/11/2016   Gastroesophageal reflux disease 12/11/2016   Kidney stones 12/11/2016   Osteopenia 12/11/2016   Multinodular goiter 06/09/2016    Allergies  Allergen Reactions   Esomeprazole Magnesium Other (See Comments)    Other Reaction: GI UPSET   Rosuvastatin Other (See Comments)    Joint and muscle pain    Past Surgical History:  Procedure Laterality Date   ABDOMINAL HYSTERECTOMY     Partial - Still have ovaries. - Cyst on uterus  BREAST BIOPSY Left 07/20/2019   sclerosing lesion   BREAST BIOPSY Left 09/29/2019   NL-neg   BREAST LUMPECTOMY WITH NEEDLE LOCALIZATION Left 09/29/2019   Procedure: BREAST LUMPECTOMY WITH NEEDLE LOCALIZATION;  Surgeon: Olean Ree, MD;  Location: [facility] ORS;  Service: General;  Laterality: Left;   BREAST SURGERY     biopsy left breast   COLONOSCOPY  02/04/2018   one polyp -    DILATION AND CURETTAGE OF UTERUS     fibroid biopsy     TONSILLECTOMY       Social History   Tobacco Use   Smoking status: Never   Smokeless tobacco: Never  Vaping Use   Vaping Use: Never used  Substance Use Topics   Alcohol use: No   Drug use: No     Medication list has been reviewed and updated.  Current Meds  Medication Sig   aspirin EC 81 MG tablet Take 81 mg by mouth daily.   Cholecalciferol 25 MCG (1000 UT) tablet Take 1,000 Units by mouth daily.    docusate sodium (COLACE) 100 MG capsule Take 100 mg by mouth 2 (two) times daily as needed.    latanoprost (XALATAN) 0.005 % ophthalmic solution Place 1 drop into both eyes at bedtime.   lisinopril (ZESTRIL) 10 MG tablet TAKE 2 TABLETS (20 MG) BY MOUTH EVERY DAY   Omega-3 Fatty Acids (FISH OIL PO) Take 2,000 mg by mouth daily.    OneTouch Delica Lancets 17O MISC USE AS DIRECTED ONCE DAILY   ONETOUCH ULTRA test strip USE AS DIRECTED ONCE DAILY   Plant Sterols and Stanols (CHOLESTOFF PLUS PO) Take 2 tablets by mouth daily. PRN    PHQ 2/9 Scores 01/28/2021 09/26/2020 07/10/2020 05/13/2020  PHQ - 2 Score 0 0 0 0  PHQ- 9 Score 1 0 - 0    GAD 7 : Generalized Anxiety Score 01/28/2021 09/26/2020 05/13/2020 01/04/2020  Nervous, Anxious, on Edge 1 0 0 0  Control/stop worrying - 0 0 0  Worry too much - different things 0 0 0 0  Trouble relaxing 0 0 0 0  Restless 0 0 0 0  Easily annoyed or irritable 0 0 0 0  Afraid - awful might happen 0 0 0 0  Total GAD 7 Score - 0 0 0  Anxiety Difficulty Not difficult at all - Not difficult at all Not difficult at all    BP Readings from Last 3 Encounters:  01/28/21 122/80  09/26/20 120/84  07/10/20 120/78    Physical Exam Vitals and nursing note reviewed.  Constitutional:      General: She is not in acute distress.    Appearance: Normal appearance. She is well-developed.  HENT:     Head: Normocephalic and atraumatic.  Neck:      Comments: Irregular soft mobile subcut mass c/w lipoma Cardiovascular:     Rate and Rhythm: Normal rate and regular rhythm.      Pulses: Normal pulses.     Heart sounds: No murmur heard. Pulmonary:     Effort: Pulmonary effort is normal. No respiratory distress.     Breath sounds: No wheezing or rhonchi.  Musculoskeletal:     Cervical back: Normal range of motion.  Skin:    General: Skin is warm and dry.     Findings: No rash.  Neurological:     Mental Status: She is alert and oriented to person, place, and time.  Psychiatric:        Mood and Affect: Mood normal.  Behavior: Behavior normal.    Wt Readings from Last 3 Encounters:  01/28/21 184 lb (83.5 kg)  09/26/20 188 lb (85.3 kg)  07/10/20 185 lb 9.6 oz (84.2 kg)    BP 122/80   Pulse 78   Ht 5\' 3"  (1.6 m)   Wt 184 lb (83.5 kg)   SpO2 98%   BMI 32.59 kg/m   Assessment and Plan: 1. Type II diabetes mellitus with complication (HCC) Clinically stable by exam and report without s/s of hypoglycemia. DM complicated by hypertension and dyslipidemia. Doing well with diet and exercise - POCT glycosylated hemoglobin (Hb A1C) = 6.4 down from 6.9  2. Lipoma of anterior chest wall Enlarging slowly - will refer for excision - Ambulatory referral to General Surgery  3. Essential hypertension Clinically stable exam with well controlled BP. Tolerating medications without side effects at this time. Pt to continue current regimen and low sodium diet; benefits of regular exercise as able discussed.  4. Carpal tunnel syndrome of right wrist Being managed conservatively but slowly worsening Would refer to Ortho when she requests   Partially dictated using Editor, commissioning. Any errors are unintentional.  Halina Maidens, MD [facility]  01/28/2021

## 2021-02-17 ENCOUNTER — Other Ambulatory Visit: Payer: Self-pay

## 2021-02-17 ENCOUNTER — Encounter: Payer: Self-pay | Admitting: Surgery

## 2021-02-17 ENCOUNTER — Telehealth: Payer: Self-pay

## 2021-02-17 ENCOUNTER — Ambulatory Visit: Payer: Medicare Other | Admitting: Surgery

## 2021-02-17 ENCOUNTER — Other Ambulatory Visit: Payer: Self-pay | Admitting: Internal Medicine

## 2021-02-17 VITALS — BP 133/79 | HR 62 | Temp 98.0°F | Ht 63.0 in | Wt 185.2 lb

## 2021-02-17 DIAGNOSIS — D1721 Benign lipomatous neoplasm of skin and subcutaneous tissue of right arm: Secondary | ICD-10-CM

## 2021-02-17 DIAGNOSIS — D170 Benign lipomatous neoplasm of skin and subcutaneous tissue of head, face and neck: Secondary | ICD-10-CM

## 2021-02-17 DIAGNOSIS — G5601 Carpal tunnel syndrome, right upper limb: Secondary | ICD-10-CM | POA: Diagnosis not present

## 2021-02-17 NOTE — Telephone Encounter (Signed)
Faxed medical clearance to Dr. Halina Maidens @ [phone].

## 2021-02-17 NOTE — Telephone Encounter (Signed)
Requested medication (s) are due for refill today: Yes  Requested medication (s) are on the active medication list: Yes  Last refill:  12/03/19  Future visit scheduled: Yes  Notes to clinic:  Prescription expired.    Requested Prescriptions  Pending Prescriptions Disp Refills   OneTouch Delica Lancets 82S [place] [Pharmacy Med Name: ONE TOUCH DELICA 60R LANCETS] 561 each 3    Sig: USE AS DIRECTED ONCE DAILY      Endocrinology: Diabetes - Testing Supplies Passed - 02/17/2021  6:34 AM      Passed - Valid encounter within last 12 months    Recent Outpatient Visits           2 weeks ago Type II diabetes mellitus with complication [facility])   [facility] Glean Hess, MD   4 months ago Type II diabetes mellitus with complication [facility])   [facility] Glean Hess, MD   9 months ago Annual physical exam   [facility] Glean Hess, MD   1 year ago Type II diabetes mellitus with complication [facility])   [facility] Glean Hess, MD   1 year ago Essential hypertension   [facility] Glean Hess, MD       Future Appointments             In 2 months Army Melia Jesse Sans, MD [facility], [facility]

## 2021-02-17 NOTE — Progress Notes (Signed)
02/17/2021  Reason for Visit:  Right supraclavicular lipoma  Referring Provider:  Halina Maidens, MD  History of Present Illness: Dawn Fields is a 75 y.o. female presenting for evaluation of a right supraclavicular lipoma.  The patient reports that she's had this for about 1.5 years and has been growing in size.  It is located just above the clavicle on the right side.  She also reports that she has another lipoma on the right shoulder which she has had for about 6 months.  She reports that this one also is slowly getting bigger.  She's worried because of the location of the masses and that she feels her carpal tunnel syndrome on her right arm is getting worse as well, with some more shooting type of discomfort in the right arm.  Denies any pain over either mass, redness of the skin, or drainage.  Past Medical History: Past Medical History:  Diagnosis Date   Allergy Pollen   Carpal tunnel syndrome of right wrist    Cataract Surgery Pending   GERD (gastroesophageal reflux disease)    History of kidney stones    Hyperlipidemia    Hypertension    Kidney stones    Multinodular goiter    Type II diabetes mellitus with complication (HCC)      Past Surgical History: Past Surgical History:  Procedure Laterality Date   ABDOMINAL HYSTERECTOMY     Partial - Still have ovaries. - Cyst on uterus    BREAST BIOPSY Left 07/20/2019   sclerosing lesion   BREAST BIOPSY Left 09/29/2019   NL-neg   BREAST LUMPECTOMY WITH NEEDLE LOCALIZATION Left 09/29/2019   Procedure: BREAST LUMPECTOMY WITH NEEDLE LOCALIZATION;  Surgeon: Olean Ree, MD;  Location: [facility] ORS;  Service: General;  Laterality: Left;   BREAST SURGERY     biopsy left breast   COLONOSCOPY  02/04/2018   one polyp -    DILATION AND CURETTAGE OF UTERUS     fibroid biopsy     TONSILLECTOMY      Home Medications: Prior to Admission medications   Medication Sig Start Date End Date Taking? Authorizing Provider  aspirin EC 81 MG tablet  Take 81 mg by mouth daily.   Yes [provider]  Cholecalciferol 25 MCG (1000 UT) tablet Take 1,000 Units by mouth daily.    Yes [provider]  docusate sodium (COLACE) 100 MG capsule Take 100 mg by mouth 2 (two) times daily as needed.    Yes [provider]  latanoprost (XALATAN) 0.005 % ophthalmic solution Place 1 drop into both eyes at bedtime.   Yes [provider]  lisinopril (ZESTRIL) 10 MG tablet TAKE 2 TABLETS (20 MG) BY MOUTH EVERY DAY 12/16/20  Yes Glean Hess, MD  Omega-3 Fatty Acids (FISH OIL PO) Take 2,000 mg by mouth daily.    Yes [provider]  OneTouch Delica Lancets 29F MISC USE AS DIRECTED ONCE DAILY 12/03/19  Yes Glean Hess, MD  [facility] ULTRA test strip USE AS DIRECTED ONCE DAILY 07/12/20  Yes Glean Hess, MD  Plant Sterols and Stanols (CHOLESTOFF PLUS PO) Take 2 tablets by mouth daily. PRN   Yes [provider]    Allergies: Allergies  Allergen Reactions   Esomeprazole Magnesium Other (See Comments)    Other Reaction: GI UPSET   Rosuvastatin Other (See Comments)    Joint and muscle pain    Social History:  reports that she has never smoked. She has never used smokeless tobacco.  She reports that she does not drink alcohol and does not use drugs.   Family History: Family History  Problem Relation Age of Onset   Stroke Mother    Hypertension Mother    Liver cancer Father    Cancer Father    COPD Sister    Diabetes Sister    Stroke Sister    Diabetes Brother    Stroke Brother    Hearing loss Brother    Diabetes Maternal Grandmother    Diabetes Maternal Grandfather    Diabetes Sister    Mental illness Sister    Breast cancer Maternal Aunt    Breast cancer Cousin        mat cousin    Review of Systems: Review of Systems  Constitutional:  Negative for chills and fever.  HENT:  Negative for hearing loss.   Respiratory:  Negative for shortness of breath.   Cardiovascular:  Negative  for chest pain.  Gastrointestinal:  Negative for abdominal pain, nausea and vomiting.  Genitourinary:  Negative for dysuria.  Musculoskeletal:  Negative for myalgias.  Skin:  Negative for rash.       Right shoulder and supraclavicular lipomas  Neurological:  Negative for dizziness.  Psychiatric/Behavioral:  Negative for depression.    Physical Exam BP 133/79   Pulse 62   Temp 98 F (36.7 C) (Oral)   Ht 5\' 3"  (1.6 m)   Wt 185 lb 3.2 oz (84 kg)   SpO2 97%   BMI 32.81 kg/m  CONSTITUTIONAL: No acute distress, well nourished HEENT:  Normocephalic, atraumatic, extraocular motion intact. NECK: Trachea is midline, and there is no jugular venous distension.  RESPIRATORY:  Normal respiratory effort without pathologic use of accessory muscles. CARDIOVASCULAR: Regular rhythm and rate. GI: The abdomen is soft, non-distended.  MUSCULOSKELETAL:  No issues with right arm/shoulder range of motion. SKIN: Patient has a 2 x 3 cm soft mass over the medial right clavicle, towards the base of the right neck.  It is soft, mobile, and non-tender. Patient also has a 1.5 cm mass over the top of the right shoulder which is also soft, mobile, and non-tender. NEUROLOGIC:  Motor and sensation is grossly normal.  Cranial nerves are grossly intact. PSYCH:  Alert and oriented to person, place and time. Affect is normal.  Laboratory Analysis: No results found for this or any previous visit (from the past 24 hour(s)).  Imaging: No results found.  Assessment and Plan: This is a 75 y.o. female with a right shoulder and right supraclavicular lipoma.  --Discussed with the patient that these are benign masses.  However, as they grow, they could potentially cause mass effect on tissues.  I do not think that they are currently in locations to push on nerves, but she does report that her carpal tunnel feels worse on the right wrist and would want the masses removed.  Discussed with her also that the right supraclavicular  mass location is near the neurovascular bundles of the low neck and I think it would be best if this would be resected in the operating room in a more controlled setting compared to an office procedure.  She's in agreement.  We would resect both lipomas through two separate incisions.  She reports that she needs to coordinate with her son who lives out of state to be able to come in to be with her for the surgery.  Given that these are benign masses, we do have time and flexibility.  She is aiming to  try to schedule surgery for around the end of August to early September.  I think that is very reasonable.  We will start sending medical clearance to Dr. Army Melia and we'll schedule a follow up with her for mid August for H&P update prior to surgery.  She's aware that we would like her to stop her ASA 5 days prior to surgery. --All questions answered.  Face-to-face time spent with the patient and care providers was 60 minutes, with more than 50% of the time spent counseling, educating, and coordinating care of the patient.     Melvyn Neth, [facility]

## 2021-02-17 NOTE — Patient Instructions (Addendum)
If you have any concerns or questions, please feel free to call our office. See follow up appointment below.   Lipoma Removal  Lipoma removal is a surgical procedure to remove a lipoma, which is a noncancerous (benign) tumor that is made up of fat cells. Most lipomas are small and painless and do not require treatment. They can form in many areas of the body but are most common under the skin of the back, arms, shoulders, buttocks, and thighs. You may need lipoma removal if you have a lipoma that is large, growing, or causing discomfort. Lipoma removal may also be done for cosmetic reasons. Tell a health care provider about: Any allergies you have. All medicines you are taking, including vitamins, herbs, eye drops, creams, and over-the-counter medicines. Any problems you or family members have had with anesthetic medicines. Any blood disorders you have. Any surgeries you have had. Any medical conditions you have. Whether you are pregnant or may be pregnant. What are the risks? Generally, this is a safe procedure. However, problems may occur, including: Infection. Bleeding. Scarring. Allergic reactions to medicines. Damage to nearby structures or organs, such as damage to nerves or blood vessels near the lipoma. What happens before the procedure? Staying hydrated Follow instructions from your health care provider about hydration, which may include: Up to 2 hours before the procedure - you may continue to drink clear liquids, such as water, clear fruit juice, black coffee, and plain tea. Eating and drinking restrictions Follow instructions from your health care provider about eating and drinking, which may include: 8 hours before the procedure - stop eating heavy meals or foods, such as meat, fried foods, or fatty foods. 6 hours before the procedure - stop eating light meals or foods, such as toast or cereal. 6 hours before the procedure - stop drinking milk or drinks that contain milk. 2  hours before the procedure - stop drinking clear liquids. Medicines Ask your health care provider about: Changing or stopping your regular medicines. This is especially important if you are taking diabetes medicines or blood thinners. Taking medicines such as aspirin and ibuprofen. These medicines can thin your blood. Do not take these medicines unless your health care provider tells you to take them. Taking over-the-counter medicines, vitamins, herbs, and supplements. General instructions You will have a physical exam. Your health care provider will check the size of the lipoma and whether it can be moved easily. You may have a biopsy and imaging tests, such as X-rays, a CT scan, and an MRI. Do not use any products that contain nicotine or tobacco for at least 4 weeks before the procedure. These products include cigarettes, e-cigarettes, and chewing tobacco. If you need help quitting, ask your health care provider. Ask your health care provider: How your surgery site will be marked. What steps will be taken to help prevent infection. These may include: Washing skin with a germ-killing soap. Taking antibiotic medicine. Plan to have someone take you home from the hospital or clinic. If you will be going home right after the procedure, plan to have someone with you for 24 hours. What happens during the procedure?  An IV will be inserted into one of your veins. You will be given one or more of the following: A medicine to help you relax (sedative). A medicine to numb the area (local anesthetic). A medicine to make you fall asleep (general anesthetic). A medicine that is injected into an area of your body to numb everything below the  injection site (regional anesthetic). An incision will be made over the lipoma or very near the lipoma. The incision may be made in a natural skin line or crease. Tissues, nerves, and blood vessels near the lipoma will be moved out of the way. The lipoma and the  capsule that surrounds it will be separated from the surrounding tissues. The lipoma will be removed. The incision may be closed with stitches (sutures). A bandage (dressing) will be placed over the incision. The procedure may vary among health care providers and hospitals. What happens after the procedure? Your blood pressure, heart rate, breathing rate, and blood oxygen level will be monitored until you leave the hospital or clinic. If you were prescribed an antibiotic medicine, use it as told by your health care provider. Do not stop using the antibiotic even if you start to feel better. If you were given a sedative during the procedure, it can affect you for several hours. Do not drive or operate machinery until your health care provider says that it is safe. Summary Before the procedure, follow instructions from your health care provider about eating and drinking, and changing or stopping your regular medicines. This is especially important if you are taking diabetes medicines or blood thinners. After the lipoma is removed, the incision may be closed with stitches (sutures) and covered with a bandage (dressing). If you were given a sedative during the procedure, it can affect you for several hours. Do not drive or operate machinery until your health care provider says that it is safe. This information is not intended to replace advice given to you by your health care provider. Make sure you discuss any questions you have with your healthcare provider. Document Revised: 03/13/2019 Document Reviewed: 03/13/2019 Elsevier Patient Education  [employer].

## 2021-02-18 ENCOUNTER — Telehealth: Payer: Self-pay

## 2021-02-18 NOTE — Telephone Encounter (Signed)
Received medical clearance from Dr. Army Melia. Pt's risk assessment is low for surgery.

## 2021-02-25 ENCOUNTER — Telehealth: Payer: Self-pay | Admitting: Surgery

## 2021-02-25 NOTE — Telephone Encounter (Signed)
Patient has been advised of Pre-Admission date/time, COVID Testing date and Surgery date.  Surgery Date: 04/03/21 Preadmission Testing Date: 03/27/21 (phone 8a-1p) Covid Testing Date: Not needed.    Patient has been made aware to call [phone], between 1-3:00pm the day before surgery, to find out what time to arrive for surgery.

## 2021-03-24 ENCOUNTER — Other Ambulatory Visit: Payer: Self-pay

## 2021-03-24 ENCOUNTER — Encounter: Payer: Self-pay | Admitting: Surgery

## 2021-03-24 ENCOUNTER — Ambulatory Visit (INDEPENDENT_AMBULATORY_CARE_PROVIDER_SITE_OTHER): Payer: Medicare Other | Admitting: Surgery

## 2021-03-24 VITALS — BP 156/88 | HR 69 | Temp 98.9°F | Ht 63.0 in | Wt 187.0 lb

## 2021-03-24 DIAGNOSIS — D1721 Benign lipomatous neoplasm of skin and subcutaneous tissue of right arm: Secondary | ICD-10-CM

## 2021-03-24 NOTE — H&P (View-Only) (Signed)
03/24/2021  History of Present Illness: Dawn Fields is a 75 y.o. female presenting for follow up of right shoulder lipomas x 2.  She is currently scheduled for excision in the OR on 04/03/21.  She reports that she feels the right supraclavicular mass may be getting bigger.  Denies any new discomfort with either mass, and denies any redness of the skin, drainage, or other concerns.  Also denies any new changes to her medical history, and denies any chest pain or shortness of breath.  She feels ready for surgery.  Past Medical History: Past Medical History:  Diagnosis Date   Allergy Pollen   Carpal tunnel syndrome of right wrist    Cataract Surgery Pending   GERD (gastroesophageal reflux disease)    History of kidney stones    Hyperlipidemia    Hypertension    Kidney stones    Multinodular goiter    Type II diabetes mellitus with complication (HCC)      Past Surgical History: Past Surgical History:  Procedure Laterality Date   ABDOMINAL HYSTERECTOMY     Partial - Still have ovaries. - Cyst on uterus    BREAST BIOPSY Left 07/20/2019   sclerosing lesion   BREAST BIOPSY Left 09/29/2019   NL-neg   BREAST LUMPECTOMY WITH NEEDLE LOCALIZATION Left 09/29/2019   Procedure: BREAST LUMPECTOMY WITH NEEDLE LOCALIZATION;  Surgeon: Olean Ree, MD;  Location: [facility] ORS;  Service: General;  Laterality: Left;   BREAST SURGERY     biopsy left breast   COLONOSCOPY  02/04/2018   one polyp -    DILATION AND CURETTAGE OF UTERUS     fibroid biopsy     TONSILLECTOMY      Home Medications: Prior to Admission medications   Medication Sig Start Date End Date Taking? Authorizing Provider  aspirin EC 81 MG tablet Take 81 mg by mouth daily.   Yes [provider]  Cholecalciferol 25 MCG (1000 UT) tablet Take 1,000 Units by mouth daily.    Yes [provider]  docusate sodium (COLACE) 100 MG capsule Take 100 mg by mouth 2 (two) times daily as needed.    Yes [provider]   latanoprost (XALATAN) 0.005 % ophthalmic solution Place 1 drop into both eyes at bedtime.   Yes [provider]  lisinopril (ZESTRIL) 10 MG tablet TAKE 2 TABLETS (20 MG) BY MOUTH EVERY DAY 12/16/20  Yes Glean Hess, MD  Omega-3 Fatty Acids (FISH OIL PO) Take 2,000 mg by mouth daily.    Yes [provider]  OneTouch Delica Lancets 99991111 MISC USE AS DIRECTED ONCE DAILY 02/17/21  Yes Glean Hess, MD  [facility] ULTRA test strip USE AS DIRECTED ONCE DAILY 07/12/20  Yes Glean Hess, MD  Plant Sterols and Stanols (CHOLESTOFF PLUS PO) Take 2 tablets by mouth daily. PRN   Yes [provider]    Allergies: Allergies  Allergen Reactions   Esomeprazole Magnesium Other (See Comments)    Other Reaction: GI UPSET   Rosuvastatin Other (See Comments)    Joint and muscle pain    Review of Systems: Review of Systems  Constitutional:  Negative for chills and fever.  Respiratory:  Negative for shortness of breath.   Cardiovascular:  Negative for chest pain.  Gastrointestinal:  Negative for nausea and vomiting.  Skin:        Right shoulder lipomas   Physical Exam BP (!) 156/88   Pulse 69   Temp 98.9 F (37.2 C) (Oral)  Ht '5\' 3"'$  (1.6 m)   Wt 187 lb (84.8 kg)   SpO2 96%   BMI 33.13 kg/m  CONSTITUTIONAL: No acute distress, well nourished. HEENT:  Normocephalic, atraumatic, extraocular motion intact. RESPIRATORY:  Normal respiratory effort without pathologic use of accessory muscles. CARDIOVASCULAR: Regular rhythm and rate SKIN:  Right shoulder has a multilobed lipoma in the supraclavicular region, measuring about 4 x 2 cm, which is slightly larger than on previous exam.  There is also a stable 1.5 cm mass over the top of the right shoulder.  Both are soft, mobile, and non-tender, without any overlying skin changes. NEUROLOGIC:  Motor and sensation is grossly normal.  Cranial nerves are grossly intact. PSYCH:  Alert and oriented to person, place and time.  Affect is normal.  Labs/Imaging: Hemoglobin A1c on 01/28/21:  6.4  Assessment and Plan: This is a 75 y.o. female with two right shoulder lipomas.  --Discussed again with the patient the surgery at length, and that she would have two incisions.  Reviewed with her the risks of bleeding, infection, and injury to surrounding structures.  Discussed post-op care and activity restrictions.  Also reminded the patient that her last dose of Aspirin would be on 8/19 to give [place] 5 days off prior to surgery.  All of her questions have been answered.  Medical clearance obtained from Dr. Army Melia. --Patient is scheduled for 04/03/21.  Face-to-face time spent with the patient and care providers was 25 minutes, with more than 50% of the time spent counseling, educating, and coordinating care of the patient.     Melvyn Neth, [facility]

## 2021-03-24 NOTE — Progress Notes (Signed)
03/24/2021  History of Present Illness: Dawn Fields is a 75 y.o. female presenting for follow up of right shoulder lipomas x 2.  She is currently scheduled for excision in the OR on 04/03/21.  She reports that she feels the right supraclavicular mass may be getting bigger.  Denies any new discomfort with either mass, and denies any redness of the skin, drainage, or other concerns.  Also denies any new changes to her medical history, and denies any chest pain or shortness of breath.  She feels ready for surgery.  Past Medical History: Past Medical History:  Diagnosis Date   Allergy Pollen   Carpal tunnel syndrome of right wrist    Cataract Surgery Pending   GERD (gastroesophageal reflux disease)    History of kidney stones    Hyperlipidemia    Hypertension    Kidney stones    Multinodular goiter    Type II diabetes mellitus with complication (HCC)      Past Surgical History: Past Surgical History:  Procedure Laterality Date   ABDOMINAL HYSTERECTOMY     Partial - Still have ovaries. - Cyst on uterus    BREAST BIOPSY Left 07/20/2019   sclerosing lesion   BREAST BIOPSY Left 09/29/2019   NL-neg   BREAST LUMPECTOMY WITH NEEDLE LOCALIZATION Left 09/29/2019   Procedure: BREAST LUMPECTOMY WITH NEEDLE LOCALIZATION;  Surgeon: Olean Ree, MD;  Location: [facility] ORS;  Service: General;  Laterality: Left;   BREAST SURGERY     biopsy left breast   COLONOSCOPY  02/04/2018   one polyp -    DILATION AND CURETTAGE OF UTERUS     fibroid biopsy     TONSILLECTOMY      Home Medications: Prior to Admission medications   Medication Sig Start Date End Date Taking? Authorizing Provider  aspirin EC 81 MG tablet Take 81 mg by mouth daily.   Yes [provider]  Cholecalciferol 25 MCG (1000 UT) tablet Take 1,000 Units by mouth daily.    Yes [provider]  docusate sodium (COLACE) 100 MG capsule Take 100 mg by mouth 2 (two) times daily as needed.    Yes [provider]   latanoprost (XALATAN) 0.005 % ophthalmic solution Place 1 drop into both eyes at bedtime.   Yes [provider]  lisinopril (ZESTRIL) 10 MG tablet TAKE 2 TABLETS (20 MG) BY MOUTH EVERY DAY 12/16/20  Yes Glean Hess, MD  Omega-3 Fatty Acids (FISH OIL PO) Take 2,000 mg by mouth daily.    Yes [provider]  OneTouch Delica Lancets 99991111 MISC USE AS DIRECTED ONCE DAILY 02/17/21  Yes Glean Hess, MD  [facility] ULTRA test strip USE AS DIRECTED ONCE DAILY 07/12/20  Yes Glean Hess, MD  Plant Sterols and Stanols (CHOLESTOFF PLUS PO) Take 2 tablets by mouth daily. PRN   Yes [provider]    Allergies: Allergies  Allergen Reactions   Esomeprazole Magnesium Other (See Comments)    Other Reaction: GI UPSET   Rosuvastatin Other (See Comments)    Joint and muscle pain    Review of Systems: Review of Systems  Constitutional:  Negative for chills and fever.  Respiratory:  Negative for shortness of breath.   Cardiovascular:  Negative for chest pain.  Gastrointestinal:  Negative for nausea and vomiting.  Skin:        Right shoulder lipomas   Physical Exam BP (!) 156/88   Pulse 69   Temp 98.9 F (37.2 C) (Oral)  Ht '5\' 3"'$  (1.6 m)   Wt 187 lb (84.8 kg)   SpO2 96%   BMI 33.13 kg/m  CONSTITUTIONAL: No acute distress, well nourished. HEENT:  Normocephalic, atraumatic, extraocular motion intact. RESPIRATORY:  Normal respiratory effort without pathologic use of accessory muscles. CARDIOVASCULAR: Regular rhythm and rate SKIN:  Right shoulder has a multilobed lipoma in the supraclavicular region, measuring about 4 x 2 cm, which is slightly larger than on previous exam.  There is also a stable 1.5 cm mass over the top of the right shoulder.  Both are soft, mobile, and non-tender, without any overlying skin changes. NEUROLOGIC:  Motor and sensation is grossly normal.  Cranial nerves are grossly intact. PSYCH:  Alert and oriented to person, place and time.  Affect is normal.  Labs/Imaging: Hemoglobin A1c on 01/28/21:  6.4  Assessment and Plan: This is a 75 y.o. female with two right shoulder lipomas.  --Discussed again with the patient the surgery at length, and that she would have two incisions.  Reviewed with her the risks of bleeding, infection, and injury to surrounding structures.  Discussed post-op care and activity restrictions.  Also reminded the patient that her last dose of Aspirin would be on 8/19 to give [place] 5 days off prior to surgery.  All of her questions have been answered.  Medical clearance obtained from Dr. Army Melia. --Patient is scheduled for 04/03/21.  Face-to-face time spent with the patient and care providers was 25 minutes, with more than 50% of the time spent counseling, educating, and coordinating care of the patient.     Melvyn Neth, [facility]

## 2021-03-24 NOTE — Patient Instructions (Addendum)
Please STOP your Aspirin on 03/29/2021. Take your LAST dose on 03/28/2021.  If you have any concerns or questions, please feel free to call our office.   Lipoma Removal   Lipoma removal is a surgical procedure to remove a lipoma, which is a noncancerous (benign) tumor that is made up of fat cells. Most lipomas are small and painless and do not require treatment. They can form in many areas of the body but are mostcommon under the skin of the back, arms, shoulders, buttocks, and thighs. You may need lipoma removal if you have a lipoma that is large, growing, orcausing discomfort. Lipoma removal may also be done for cosmetic reasons. Tell a health care provider about: Any allergies you have. All medicines you are taking, including vitamins, herbs, eye drops, creams, and over-the-counter medicines. Any problems you or family members have had with anesthetic medicines. Any blood disorders you have. Any surgeries you have had. Any medical conditions you have. Whether you are pregnant or may be pregnant. What are the risks? Generally, this is a safe procedure. However, problems may occur, including: Infection. Bleeding. Scarring. Allergic reactions to medicines. Damage to nearby structures or organs, such as damage to nerves or blood vessels near the lipoma. What happens before the procedure? Staying hydrated Follow instructions from your health care provider about hydration, which may include: Up to 2 hours before the procedure - you may continue to drink clear liquids, such as water, clear fruit juice, black coffee, and plain tea. Eating and drinking restrictions Follow instructions from your health care provider about eating and drinking, which may include: 8 hours before the procedure - stop eating heavy meals or foods, such as meat, fried foods, or fatty foods. 6 hours before the procedure - stop eating light meals or foods, such as toast or cereal. 6 hours before the procedure - stop  drinking milk or drinks that contain milk. 2 hours before the procedure - stop drinking clear liquids. Medicines Ask your health care provider about: Changing or stopping your regular medicines. This is especially important if you are taking diabetes medicines or blood thinners. Taking medicines such as aspirin and ibuprofen. These medicines can thin your blood. Do not take these medicines unless your health care provider tells you to take them. Taking over-the-counter medicines, vitamins, herbs, and supplements. General instructions You will have a physical exam. Your health care provider will check the size of the lipoma and whether it can be moved easily. You may have a biopsy and imaging tests, such as X-rays, a CT scan, and an MRI. Do not use any products that contain nicotine or tobacco for at least 4 weeks before the procedure. These products include cigarettes, e-cigarettes, and chewing tobacco. If you need help quitting, ask your health care provider. Ask your health care provider: How your surgery site will be marked. What steps will be taken to help prevent infection. These may include: Washing skin with a germ-killing soap. Taking antibiotic medicine. Plan to have someone take you home from the hospital or clinic. If you will be going home right after the procedure, plan to have someone with you for 24 hours. What happens during the procedure?  An IV will be inserted into one of your veins. You will be given one or more of the following: A medicine to help you relax (sedative). A medicine to numb the area (local anesthetic). A medicine to make you fall asleep (general anesthetic). A medicine that is injected into an area of  your body to numb everything below the injection site (regional anesthetic). An incision will be made over the lipoma or very near the lipoma. The incision may be made in a natural skin line or crease. Tissues, nerves, and blood vessels near the lipoma will  be moved out of the way. The lipoma and the capsule that surrounds it will be separated from the surrounding tissues. The lipoma will be removed. The incision may be closed with stitches (sutures). A bandage (dressing) will be placed over the incision. The procedure may vary among health care providers and hospitals. What happens after the procedure? Your blood pressure, heart rate, breathing rate, and blood oxygen level will be monitored until you leave the hospital or clinic. If you were prescribed an antibiotic medicine, use it as told by your health care provider. Do not stop using the antibiotic even if you start to feel better. If you were given a sedative during the procedure, it can affect you for several hours. Do not drive or operate machinery until your health care provider says that it is safe. Summary Before the procedure, follow instructions from your health care provider about eating and drinking, and changing or stopping your regular medicines. This is especially important if you are taking diabetes medicines or blood thinners. After the lipoma is removed, the incision may be closed with stitches (sutures) and covered with a bandage (dressing). If you were given a sedative during the procedure, it can affect you for several hours. Do not drive or operate machinery until your health care provider says that it is safe. This information is not intended to replace advice given to you by your health care provider. Make sure you discuss any questions you have with your healthcare provider. Document Revised: 03/13/2019 Document Reviewed: 03/13/2019 Elsevier Patient Education  [place].

## 2021-03-27 ENCOUNTER — Other Ambulatory Visit
Admission: RE | Admit: 2021-03-27 | Discharge: 2021-03-27 | Disposition: A | Discharge status: Home / Self Care | Payer: Medicare Other | Source: Ambulatory Visit | Attending: Surgery | Admitting: Surgery

## 2021-03-27 ENCOUNTER — Other Ambulatory Visit: Payer: Self-pay

## 2021-03-27 NOTE — Patient Instructions (Addendum)
Your procedure is scheduled on: 04-03-21- Thursday Report to the Registration Desk on the 1st floor of the [facility]. To find out your arrival time, please call [phone] between 1PM - 3PM on: 04/02/21 - Wednesday   REMEMBER: Instructions that are not followed completely may result in serious medical risk, up to and including death; or upon the discretion of your surgeon and anesthesiologist your surgery may need to be rescheduled.  Do not eat food after midnight the night before surgery.  No gum chewing, lozengers or hard candies.  You may however, drink CLEAR liquids up to 2 hours before you are scheduled to arrive for your surgery. Do not drink anything within 2 hours of your scheduled arrival time.   Clear liquids include: - water  - apple juice without pulp - gatorade (not RED, PURPLE, OR BLUE) - black coffee or tea (Do NOT add milk or creamers to the coffee or tea) Do NOT drink anything that is not on this list.  TAKE THESE MEDICATIONS THE MORNING OF SURGERY WITH A SIP OF WATER: NONE  Follow recommendations from Cardiologist, Pulmonologist or PCP regarding stopping Aspirin, Coumadin, Plavix, Eliquis, Pradaxa, or Pletal. Stop taking Aspirin 81 mg beginning 03/28/21.  One week prior to surgery: Stop Anti-inflammatories (NSAIDS) such as Advil, Aleve, Ibuprofen, Motrin, Naproxen, Naprosyn and Aspirin based products such as Excedrin, Goodys Powder, BC Powder.  Stop beginning 03/28/21 ANY OVER THE COUNTER supplements until after surgery. Omega-3 Fatty Acids (FISH OIL) 1000 MG CAPS  You may take Tylenol as directed if needed for pain up until the day of surgery.  No Alcohol for 24 hours before or after surgery.  No Smoking including e-cigarettes for 24 hours prior to surgery.  No chewable tobacco products for at least 6 hours prior to surgery.  No nicotine patches on the day of surgery.  Do not use any "recreational" drugs for at least a week prior to your surgery.   Please be advised that the combination of cocaine and anesthesia may have negative outcomes, up to and including death. If you test positive for cocaine, your surgery will be cancelled.  On the morning of surgery brush your teeth with toothpaste and water, you may rinse your mouth with mouthwash if you wish. Do not swallow any toothpaste or mouthwash.  Do not wear jewelry, make-up, hairpins, clips or nail polish.  Do not wear lotions, powders, or perfumes.   Do not shave body from the neck down 48 hours prior to surgery just in case you cut yourself which could leave a site for infection.  Also, freshly shaved skin may become irritated if using the CHG soap.  Contact lenses, hearing aids and dentures may not be worn into surgery.  Do not bring valuables to the hospital. [facility] is not responsible for any missing/lost belongings or valuables.   Use CHG Soap or wipes as directed on instruction sheet.  Notify your doctor if there is any change in your medical condition (cold, fever, infection).  Wear comfortable clothing (specific to your surgery type) to the hospital.  After surgery, you can help prevent lung complications by doing breathing exercises.  Take deep breaths and cough every 1-2 hours. Your doctor may order a device called an Incentive Spirometer to help you take deep breaths. When coughing or sneezing, hold a pillow firmly against your incision with both hands. This is called "splinting." Doing this helps protect your incision. It also decreases belly discomfort.  If you are being admitted  to the hospital overnight, leave your suitcase in the car. After surgery it may be brought to your room.  If you are being discharged the day of surgery, you will not be allowed to drive home. You will need a responsible adult (18 years or older) to drive you home and stay with you that night.   If you are taking public transportation, you will need to have a responsible adult (18  years or older) with you. Please confirm with your physician that it is acceptable to use public transportation.   Please call the [facility] at [phone] if you have any questions about these instructions.  Surgery Visitation Policy:  Patients undergoing a surgery or procedure may have one family member or support person with them as long as that person is not COVID-19 positive or experiencing its symptoms.  That person may remain in the waiting area during the procedure.  Inpatient Visitation:    Visiting hours are 7 a.m. to 8 p.m. Inpatients will be allowed two visitors daily. The visitors may change each day during the patient's stay. No visitors under the age of 29. Any visitor under the age of 69 must be accompanied by an adult. The visitor must pass COVID-19 screenings, use hand sanitizer when entering and exiting the patient's room and wear a mask at all times, including in the patient's room. Patients must also wear a mask when staff or their visitor are in the room. Masking is required regardless of vaccination status.

## 2021-03-28 ENCOUNTER — Encounter: Payer: Self-pay | Admitting: Urgent Care

## 2021-03-28 ENCOUNTER — Encounter
Admission: RE | Admit: 2021-03-28 | Discharge: 2021-03-28 | Disposition: A | Discharge status: Home / Self Care | Payer: Medicare Other | Source: Ambulatory Visit | Attending: Surgery | Admitting: Surgery

## 2021-03-28 DIAGNOSIS — Z01818 Encounter for other preprocedural examination: Secondary | ICD-10-CM | POA: Insufficient documentation

## 2021-03-28 LAB — CBC
HCT: 37.5 % (ref 36.0–46.0)
Hemoglobin: 12.5 g/dL (ref 12.0–15.0)
MCH: 29.6 pg (ref 26.0–34.0)
MCHC: 33.3 g/dL (ref 30.0–36.0)
MCV: 88.7 fL (ref 80.0–100.0)
Platelets: 279 10*3/uL (ref 150–400)
RBC: 4.23 MIL/uL (ref 3.87–5.11)
RDW: 13.5 % (ref 11.5–15.5)
WBC: 6.5 10*3/uL (ref 4.0–10.5)
nRBC: 0 % (ref 0.0–0.2)

## 2021-03-28 LAB — BASIC METABOLIC PANEL
Anion gap: 7 (ref 5–15)
BUN: 19 mg/dL (ref 8–23)
CO2: 26 mmol/L (ref 22–32)
Calcium: 9.7 mg/dL (ref 8.9–10.3)
Chloride: 104 mmol/L (ref 98–111)
Creatinine, Ser: 0.88 mg/dL (ref 0.44–1.00)
GFR, Estimated: >60 mL/min (ref >60)
Glucose, Bld: 100 mg/dL — ABNORMAL HIGH (ref 70–99)
Potassium: 4 mmol/L (ref 3.5–5.1)
Sodium: 137 mmol/L (ref 135–145)

## 2021-04-03 ENCOUNTER — Ambulatory Visit: Payer: Medicare Other | Admitting: Anesthesiology

## 2021-04-03 ENCOUNTER — Ambulatory Visit
Admission: RE | Admit: 2021-04-03 | Discharge: 2021-04-03 | Disposition: A | Discharge status: Home / Self Care | Payer: Medicare Other | Attending: Surgery | Admitting: Surgery

## 2021-04-03 ENCOUNTER — Encounter
Admission: RE | Disposition: A | Discharge status: Home / Self Care | Payer: Self-pay | Source: Home / Self Care | Attending: Surgery

## 2021-04-03 DIAGNOSIS — D1721 Benign lipomatous neoplasm of skin and subcutaneous tissue of right arm: Secondary | ICD-10-CM | POA: Diagnosis not present

## 2021-04-03 DIAGNOSIS — K219 Gastro-esophageal reflux disease without esophagitis: Secondary | ICD-10-CM | POA: Diagnosis not present

## 2021-04-03 DIAGNOSIS — D170 Benign lipomatous neoplasm of skin and subcutaneous tissue of head, face and neck: Secondary | ICD-10-CM | POA: Diagnosis not present

## 2021-04-03 DIAGNOSIS — Z79899 Other long term (current) drug therapy: Secondary | ICD-10-CM | POA: Diagnosis not present

## 2021-04-03 DIAGNOSIS — Z888 Allergy status to other drugs, medicaments and biological substances status: Secondary | ICD-10-CM | POA: Diagnosis not present

## 2021-04-03 DIAGNOSIS — Z7982 Long term (current) use of aspirin: Secondary | ICD-10-CM | POA: Diagnosis not present

## 2021-04-03 DIAGNOSIS — D1739 Benign lipomatous neoplasm of skin and subcutaneous tissue of other sites: Secondary | ICD-10-CM | POA: Diagnosis not present

## 2021-04-03 HISTORY — PX: LIPOMA EXCISION: SHX5283

## 2021-04-03 LAB — POCT I-STAT, CHEM 8
BUN: 21 mg/dL (ref 8–23)
Calcium, Ion: 1.24 mmol/L (ref 1.15–1.40)
Chloride: 102 mmol/L (ref 98–111)
Creatinine, Ser: 1 mg/dL (ref 0.44–1.00)
Glucose, Bld: 119 mg/dL — ABNORMAL HIGH (ref 70–99)
HCT: 37 % (ref 36.0–46.0)
Hemoglobin: 12.6 g/dL (ref 12.0–15.0)
Potassium: 4.7 mmol/L (ref 3.5–5.1)
Sodium: 137 mmol/L (ref 135–145)
TCO2: 25 mmol/L (ref 22–32)

## 2021-04-03 LAB — GLUCOSE, CAPILLARY
Glucose-Capillary: 120 mg/dL — ABNORMAL HIGH (ref 70–99)
Glucose-Capillary: 113 mg/dL — ABNORMAL HIGH (ref 70–99)

## 2021-04-03 SURGERY — EXCISION LIPOMA
Anesthesia: General | Laterality: Right

## 2021-04-03 MED ORDER — LIDOCAINE HCL (CARDIAC) PF 100 MG/5ML IV SOSY
PREFILLED_SYRINGE | INTRAVENOUS | Status: DC | PRN
  Administered 2021-04-03: 80 mg via INTRAVENOUS

## 2021-04-03 MED ORDER — SODIUM CHLORIDE 0.9 % IV SOLN: INTRAVENOUS | Status: DC

## 2021-04-03 MED ORDER — OXYCODONE HCL 5 MG/5ML PO SOLN: 5.0000 mg | Freq: Once | ORAL | Status: DC | PRN

## 2021-04-03 MED ORDER — ACETAMINOPHEN 500 MG PO TABS
1000.0000 mg | ORAL_TABLET | ORAL | Status: AC
  Administered 2021-04-03: 1000 mg via ORAL

## 2021-04-03 MED ORDER — DEXAMETHASONE SODIUM PHOSPHATE 10 MG/ML IJ SOLN
INTRAMUSCULAR | Status: DC | PRN
  Administered 2021-04-03: 5 mg via INTRAVENOUS

## 2021-04-03 MED ORDER — CEFAZOLIN SODIUM-DEXTROSE 2-4 GM/100ML-% IV SOLN
INTRAVENOUS | Status: AC
  Filled 2021-04-03: qty 100

## 2021-04-03 MED ORDER — SODIUM CHLORIDE FLUSH 0.9 % IV SOLN
INTRAVENOUS | Status: AC
  Filled 2021-04-03: qty 30

## 2021-04-03 MED ORDER — ONDANSETRON HCL 4 MG/2ML IJ SOLN: 4.0000 mg | Freq: Once | INTRAMUSCULAR | Status: DC | PRN

## 2021-04-03 MED ORDER — ORAL CARE MOUTH RINSE: 15.0000 mL | Freq: Once | OROMUCOSAL | Status: AC

## 2021-04-03 MED ORDER — PROPOFOL 10 MG/ML IV BOLUS
INTRAVENOUS | Status: DC | PRN
  Administered 2021-04-03: 120 mg via INTRAVENOUS

## 2021-04-03 MED ORDER — IBUPROFEN 600 MG PO TABS: 600.0000 mg | ORAL_TABLET | Freq: Three times a day (TID) | ORAL | 0 refills | Status: DC | PRN

## 2021-04-03 MED ORDER — GLYCOPYRROLATE 0.2 MG/ML IJ SOLN
INTRAMUSCULAR | Status: DC | PRN
  Administered 2021-04-03: .1 mg via INTRAVENOUS

## 2021-04-03 MED ORDER — FAMOTIDINE 20 MG PO TABS: 20.0000 mg | ORAL_TABLET | Freq: Once | ORAL | Status: AC

## 2021-04-03 MED ORDER — EPHEDRINE SULFATE 50 MG/ML IJ SOLN
INTRAMUSCULAR | Status: DC | PRN
  Administered 2021-04-03: 5 mg via INTRAVENOUS

## 2021-04-03 MED ORDER — FENTANYL CITRATE (PF) 100 MCG/2ML IJ SOLN: 25.0000 ug | INTRAMUSCULAR | Status: DC | PRN

## 2021-04-03 MED ORDER — CEFAZOLIN SODIUM-DEXTROSE 2-4 GM/100ML-% IV SOLN
2.0000 g | INTRAVENOUS | Status: AC
  Administered 2021-04-03: 2 g via INTRAVENOUS

## 2021-04-03 MED ORDER — ACETAMINOPHEN 500 MG PO TABS
ORAL_TABLET | ORAL | Status: AC
  Filled 2021-04-03: qty 2

## 2021-04-03 MED ORDER — OXYCODONE HCL 5 MG PO TABS: 5.0000 mg | ORAL_TABLET | Freq: Once | ORAL | Status: DC | PRN

## 2021-04-03 MED ORDER — CHLORHEXIDINE GLUCONATE 0.12 % MT SOLN: 15.0000 mL | Freq: Once | OROMUCOSAL | Status: AC

## 2021-04-03 MED ORDER — BUPIVACAINE HCL (PF) 0.5 % IJ SOLN
INTRAMUSCULAR | Status: DC | PRN
  Administered 2021-04-03: 20 mL

## 2021-04-03 MED ORDER — GABAPENTIN 100 MG PO CAPS
200.0000 mg | ORAL_CAPSULE | ORAL | Status: AC
  Administered 2021-04-03: 200 mg via ORAL

## 2021-04-03 MED ORDER — OXYCODONE HCL 5 MG PO TABS: 5.0000 mg | ORAL_TABLET | Freq: Four times a day (QID) | ORAL | 0 refills | Status: DC | PRN

## 2021-04-03 MED ORDER — LACTATED RINGERS IV SOLN: INTRAVENOUS | Status: DC | PRN

## 2021-04-03 MED ORDER — GABAPENTIN 100 MG PO CAPS
ORAL_CAPSULE | ORAL | Status: AC
  Filled 2021-04-03: qty 2

## 2021-04-03 MED ORDER — CHLORHEXIDINE GLUCONATE 0.12 % MT SOLN
OROMUCOSAL | Status: AC
  Administered 2021-04-03: 15 mL via OROMUCOSAL
  Filled 2021-04-03: qty 15

## 2021-04-03 MED ORDER — ACETAMINOPHEN 500 MG PO TABS: 1000.0000 mg | ORAL_TABLET | Freq: Four times a day (QID) | ORAL | Status: AC | PRN

## 2021-04-03 MED ORDER — BUPIVACAINE HCL (PF) 0.5 % IJ SOLN
INTRAMUSCULAR | Status: AC
  Filled 2021-04-03: qty 30

## 2021-04-03 MED ORDER — SUCCINYLCHOLINE CHLORIDE 200 MG/10ML IV SOSY
PREFILLED_SYRINGE | INTRAVENOUS | Status: DC | PRN
  Administered 2021-04-03: 100 mg via INTRAVENOUS

## 2021-04-03 MED ORDER — CHLORHEXIDINE GLUCONATE CLOTH 2 % EX PADS: 6.0000 | MEDICATED_PAD | Freq: Once | CUTANEOUS | Status: DC

## 2021-04-03 MED ORDER — FENTANYL CITRATE (PF) 100 MCG/2ML IJ SOLN
INTRAMUSCULAR | Status: DC | PRN
  Administered 2021-04-03: 50 ug via INTRAVENOUS
  Administered 2021-04-03: 25 ug via INTRAVENOUS

## 2021-04-03 MED ORDER — FENTANYL CITRATE (PF) 100 MCG/2ML IJ SOLN
INTRAMUSCULAR | Status: AC
  Filled 2021-04-03: qty 2

## 2021-04-03 MED ORDER — FAMOTIDINE 20 MG PO TABS
ORAL_TABLET | ORAL | Status: AC
  Administered 2021-04-03: 20 mg via ORAL
  Filled 2021-04-03: qty 1

## 2021-04-03 MED ORDER — BUPIVACAINE LIPOSOME 1.3 % IJ SUSP
INTRAMUSCULAR | Status: AC
  Filled 2021-04-03: qty 20

## 2021-04-03 MED ORDER — BUPIVACAINE LIPOSOME 1.3 % IJ SUSP
INTRAMUSCULAR | Status: DC | PRN
  Administered 2021-04-03: 20 mL

## 2021-04-03 MED ORDER — ONDANSETRON HCL 4 MG/2ML IJ SOLN
INTRAMUSCULAR | Status: DC | PRN
  Administered 2021-04-03: 4 mg via INTRAVENOUS

## 2021-04-03 SURGICAL SUPPLY — 31 items
ADH SKN CLS APL DERMABOND .7 (GAUZE/BANDAGES/DRESSINGS) ×1
APL PRP STRL LF DISP 70% ISPRP (MISCELLANEOUS) ×1
CHLORAPREP W/TINT 26 (MISCELLANEOUS) ×2 IMPLANT
DERMABOND ADVANCED (GAUZE/BANDAGES/DRESSINGS) ×1
DERMABOND ADVANCED .7 DNX12 (GAUZE/BANDAGES/DRESSINGS) ×1 IMPLANT
DRAPE 3/4 80X56 (DRAPES) ×4 IMPLANT
DRAPE LAPAROTOMY 100X77 ABD (DRAPES) ×2 IMPLANT
ELECT CAUTERY BLADE TIP 2.5 (TIP) ×2
ELECT REM PT RETURN 9FT ADLT (ELECTROSURGICAL) ×2
ELECTRODE CAUTERY BLDE TIP 2.5 (TIP) ×1 IMPLANT
ELECTRODE REM PT RTRN 9FT ADLT (ELECTROSURGICAL) ×1 IMPLANT
GAUZE 4X4 16PLY ~~LOC~~+RFID DBL (SPONGE) ×2 IMPLANT
GLOVE SURG SYN 7.0 (GLOVE) ×8 IMPLANT
GLOVE SURG SYN 7.5  E (GLOVE) ×4
GLOVE SURG SYN 7.5 E (GLOVE) ×4 IMPLANT
GOWN STRL REUS W/ TWL LRG LVL3 (GOWN DISPOSABLE) ×4 IMPLANT
GOWN STRL REUS W/TWL LRG LVL3 (GOWN DISPOSABLE) ×8
KIT TURNOVER KIT A (KITS) ×2 IMPLANT
LABEL OR SOLS (LABEL) ×2 IMPLANT
MANIFOLD NEPTUNE II (INSTRUMENTS) ×2 IMPLANT
NEEDLE HYPO 22GX1.5 SAFETY (NEEDLE) ×2 IMPLANT
NS IRRIG 1000ML POUR BTL (IV SOLUTION) ×2 IMPLANT
PACK BASIN MINOR ARMC (MISCELLANEOUS) ×2 IMPLANT
SUT MNCRL 4-0 (SUTURE) ×2
SUT MNCRL 4-0 27XMFL (SUTURE) ×1
SUT VIC AB 0 SH 27 (SUTURE) ×4 IMPLANT
SUT VIC AB 3-0 SH 27 (SUTURE) ×4
SUT VIC AB 3-0 SH 27X BRD (SUTURE) ×2 IMPLANT
SUTURE MNCRL 4-0 27XMF (SUTURE) ×1 IMPLANT
SYR 30ML LL (SYRINGE) ×2 IMPLANT
WATER STERILE IRR 500ML POUR (IV SOLUTION) ×2 IMPLANT

## 2021-04-03 NOTE — Transfer of Care (Signed)
Immediate Anesthesia Transfer of Care Note  Patient: Dawn Fields  Procedure(s) Performed: EXCISION LIPOMA, right supraclavicular & right shoulder lipomas x 2 (Right)  Patient Location: PACU  Anesthesia Type:General  Level of Consciousness: drowsy  Airway & Oxygen Therapy: Patient Spontanous Breathing  Post-op Assessment: Report given to RN and Post -op Vital signs reviewed and stable  Post vital signs: Reviewed and stable  Last Vitals:  Vitals Value Taken Time  BP 147/80 04/03/21 1434  Temp    Pulse 86 04/03/21 1437  Resp 17 04/03/21 1437  SpO2 97 % 04/03/21 1437  Vitals shown include unvalidated device data.  Last Pain:  Vitals:   04/03/21 1028  TempSrc: Temporal  PainSc: 0-No pain         Complications: No notable events documented.

## 2021-04-03 NOTE — Discharge Instructions (Signed)

## 2021-04-03 NOTE — Anesthesia Procedure Notes (Signed)
Procedure Name: Intubation Date/Time: 04/03/2021 12:44 PM Performed by: Posey Pronto, Kelle Ruppert, CRNA Pre-anesthesia Checklist: Patient identified, Patient being monitored, Timeout performed, Emergency Drugs available and Suction available Patient Re-evaluated:Patient Re-evaluated prior to induction Oxygen Delivery Method: Circle system utilized Preoxygenation: Pre-oxygenation with 100% oxygen Induction Type: IV induction Laryngoscope Size: 3 and McGraph Grade View: Grade II Tube type: Oral Tube size: 6.5 mm Number of attempts: 1 Airway Equipment and Method: Stylet Placement Confirmation: ETT inserted through vocal cords under direct vision, positive ETCO2 and breath sounds checked- equal and bilateral Secured at: 21 cm Tube secured with: Tape Dental Injury: Teeth and Oropharynx as per pre-operative assessment  Comments: Eyes taped prior to intubation.

## 2021-04-03 NOTE — Op Note (Signed)
  Procedure Date:  04/03/2021  Pre-operative Diagnosis:  Right supraclavicular lipoma, right shoulder lipoma  Post-operative Diagnosis:  Right supraclavicular lipoma, right shoulder lipoma  Procedure:  Excision of right supraclavicular lipoma and right shoulder lipoma  Surgeon:  Melvyn Neth, MD  Anesthesia:  General endotracheal  Estimated Blood Loss:  5 ml  Specimens:   Right supraclavicular lipoma Right shoulder lipoma  Complications:  None  Indications for Procedure:  This is a 75 y.o. female with diagnosis of a symptomatic right supraclavicular and right shoulder lipomas.  The patient wishes to have them excised. The risks of bleeding, abscess or infection, injury to surrounding structures, and need for further procedures were all discussed with the patient and she was willing to proceed.  Description of Procedure: The patient was correctly identified in the preoperative area and brought into the operating room.  The patient was placed supine with VTE prophylaxis in place.  Appropriate time-outs were performed.  Anesthesia was induced and the patient was intubated.  Appropriate antibiotics were infused.  The patient was then placed in left lateral decubitus position.  The patient's right upper chest and shoulder were prepped and draped in usual sterile fashion.  We started with the supraclavicular mass, which measured about 5 cm.  A 6 cm incision was made over the lipoma, and cautery was used to dissect down the subcutaneous tissue to the lipoma itself.  Skin flaps were created using cautery as well, and then the lipoma was excised using cautery, intact.  It was sent off to pathology.  The cavity was then irrigated and hemostasis was assured with cautery.  Local anesthetic was infused intradermally.  The skin flaps were then approximated to the wound bed using multiple 0 Vicryl sutures in order to decrease the amount of dead space in the wound.  The wound edges were then closed in  two layers using 3-0 Vicryl and 4-0 Monocryl.    We then proceeded with the shoulder mass, which measured about 3 cm.  A 4 cm incision was made over the lipoma, and cautery was used to dissect down the subcutaneous tissue to the lipoma itself.  Skin flaps were created using cautery as well, and then the lipoma was excised using cautery, intact.  It was sent off to pathology.  The cavity was then irrigated and hemostasis was assured with cautery.  Local anesthetic was infused intradermally.  The skin flaps were then approximated to the wound bed using multiple 0 Vicryl sutures in order to decrease the amount of dead space in the wound.  The wound edges were then closed in two layers using 3-0 Vicryl and 4-0 Monocryl.  The two incisions were then cleaned and sealed with DermaBond.  The patient was then emerged from anesthesia, extubated, and brought to the recovery room for further management.    The patient tolerated the procedure well and all counts were correct at the end of the case.   Melvyn Neth, MD

## 2021-04-03 NOTE — Anesthesia Preprocedure Evaluation (Signed)
Anesthesia Evaluation  Patient identified by MRN, date of birth, ID band Patient awake    Reviewed: Allergy & Precautions, H&P , NPO status , Patient's Chart, lab work & pertinent test results  History of Anesthesia Complications Negative for: history of anesthetic complications  Airway Mallampati: III  TM Distance: >3 FB Neck ROM: limited    Dental  (+) Chipped   Pulmonary neg pulmonary ROS, neg shortness of breath,    breath sounds clear to auscultation       Cardiovascular Exercise Tolerance: Good hypertension, (-) angina(-) Past MI and (-) DOE  Rhythm:Regular Rate:Normal     Neuro/Psych  Neuromuscular disease negative psych ROS   GI/Hepatic Neg liver ROS, GERD  Medicated and Controlled,  Endo/Other  diabetes, Type 2  Renal/GU Renal disease     Musculoskeletal   Abdominal   Peds  Hematology negative hematology ROS (+)   Anesthesia Other Findings Past Medical History: Pollen: Allergy No date: Carpal tunnel syndrome of right wrist Surgery Pending: Cataract No date: GERD (gastroesophageal reflux disease) No date: History of kidney stones No date: Hyperlipidemia No date: Hypertension No date: Multinodular goiter No date: Type II diabetes mellitus with complication ([place])  Past Surgical History: No date: ABDOMINAL HYSTERECTOMY     Comment:  Partial - Still have ovaries. - Cyst on uterus  07/20/2019: BREAST BIOPSY; Left     Comment:  sclerosing lesion No date: BREAST SURGERY     Comment:  biopsy left breast 02/04/2018: COLONOSCOPY     Comment:  one polyp -  No date: DILATION AND CURETTAGE OF UTERUS No date: fibroid biopsy No date: TONSILLECTOMY  BMI    Body Mass Index: 31.87 kg/m      Reproductive/Obstetrics negative OB ROS                             Anesthesia Physical  Anesthesia Plan  ASA: 3  Anesthesia Plan: General   Post-op Pain Management:    Induction:  Intravenous  PONV Risk Score and Plan: Dexamethasone, Ondansetron and Treatment may vary due to age or medical condition  Airway Management Planned: LMA and Oral ETT  Additional Equipment: None  Intra-op Plan:   Post-operative Plan: Extubation in OR  Informed Consent: I have reviewed the patients History and Physical, chart, labs and discussed the procedure including the risks, benefits and alternatives for the proposed anesthesia with the patient or authorized representative who has indicated his/her understanding and acceptance.     Dental Advisory Given  Plan Discussed with: Anesthesiologist, CRNA and Surgeon  Anesthesia Plan Comments: (Patient consented for risks of anesthesia including but not limited to:  - adverse reactions to medications - damage to teeth, lips or other oral mucosa - sore throat or hoarseness - Damage to heart, brain, lungs or loss of life  Patient voiced understanding.)        Anesthesia Quick Evaluation

## 2021-04-03 NOTE — Interval H&P Note (Signed)
History and Physical Interval Note:  04/03/2021 12:00 PM  Dawn Fields  has presented today for surgery, with the diagnosis of Right supraclavicular and right shoulder lipoma.  The various methods of treatment have been discussed with the patient and family. After consideration of risks, benefits and other options for treatment, the patient has consented to  Procedure(s) with comments: EXCISION LIPOMA, right supraclavicular & right shoulder lipomas x 2 (Right) - Right supraclavicular and right shoulder lipomas as a surgical intervention.  The patient's history has been reviewed, patient examined, no change in status, stable for surgery.  I have reviewed the patient's chart and labs.  Questions were answered to the patient's satisfaction.     Ayoub Arey

## 2021-04-04 ENCOUNTER — Encounter: Payer: Self-pay | Admitting: Surgery

## 2021-04-05 NOTE — Anesthesia Postprocedure Evaluation (Signed)
Anesthesia Post Note  Patient: Dawn Fields  Procedure(s) Performed: EXCISION LIPOMA, right supraclavicular & right shoulder lipomas x 2 (Right)  Patient location during evaluation: PACU Anesthesia Type: General Level of consciousness: awake and alert Pain management: pain level controlled Vital Signs Assessment: post-procedure vital signs reviewed and stable Respiratory status: spontaneous breathing, nonlabored ventilation, respiratory function stable and patient connected to nasal cannula oxygen Cardiovascular status: blood pressure returned to baseline and stable Postop Assessment: no apparent nausea or vomiting Anesthetic complications: no   No notable events documented.   Last Vitals:  Vitals:   04/03/21 1530 04/03/21 1552  BP: 132/72 (!) 159/74  Pulse: 66 64  Resp: 15 16  Temp:  (!) 36.1 C  SpO2: 96% 100%    Last Pain:  Vitals:   04/04/21 0931  TempSrc:   PainSc: 0-No pain                 Arita Miss

## 2021-04-07 LAB — SURGICAL PATHOLOGY

## 2021-04-09 ENCOUNTER — Other Ambulatory Visit: Payer: Self-pay | Admitting: Internal Medicine

## 2021-04-09 DIAGNOSIS — E782 Mixed hyperlipidemia: Secondary | ICD-10-CM

## 2021-04-09 NOTE — Telephone Encounter (Signed)
Requested medications are due for refill today.  yes  Requested medications are on the active medications list.  yes  Last refill. 03/24/2021  Future visit scheduled.   yes  Notes to clinic.  Historical medication. Rx written by Dr. Hampton Abbot.

## 2021-04-18 ENCOUNTER — Encounter: Payer: Self-pay | Admitting: Surgery

## 2021-04-18 ENCOUNTER — Ambulatory Visit (INDEPENDENT_AMBULATORY_CARE_PROVIDER_SITE_OTHER): Payer: Medicare Other | Admitting: Surgery

## 2021-04-18 ENCOUNTER — Other Ambulatory Visit: Payer: Self-pay

## 2021-04-18 VITALS — BP 135/84 | HR 70 | Temp 98.3°F | Ht 63.0 in | Wt 183.0 lb

## 2021-04-18 DIAGNOSIS — Z09 Encounter for follow-up examination after completed treatment for conditions other than malignant neoplasm: Secondary | ICD-10-CM

## 2021-04-18 DIAGNOSIS — D170 Benign lipomatous neoplasm of skin and subcutaneous tissue of head, face and neck: Secondary | ICD-10-CM

## 2021-04-18 DIAGNOSIS — D1721 Benign lipomatous neoplasm of skin and subcutaneous tissue of right arm: Secondary | ICD-10-CM

## 2021-04-18 NOTE — Progress Notes (Signed)
04/18/2021  HPI: Dawn Fields is a 75 y.o. female s/p excision of right shoulder and right supraclavicular lipomas on 04/03/2021.  She presents today for follow-up.  Patient reports that she has been doing very well with minimal pain that went away after only the first couple of days without any issues with the incisions.  Vital signs: BP 135/84   Pulse 70   Temp 98.3 F (36.8 C)   Ht '5\' 3"'$  (1.6 m)   Wt 183 lb (83 kg)   SpO2 95%   BMI 32.42 kg/m    Physical Exam: Constitutional: No acute distress Skin: Right supraclavicular and right shoulder incisions are healing well, and are clean, dry, intact.  There is no evidence of infection or wound breakdown.  Assessment/Plan: This is a 75 y.o. female s/p excision of right shoulder and right supraclavicular lipomas.  - Reviewed pathology results with the patient.  These are both lipomas and discussed with the patient that these are benign masses. - Patient is healing well without any evidence of complications. - Follow-up as needed.   Melvyn Neth, [facility]

## 2021-04-18 NOTE — Patient Instructions (Signed)
Follow up as needed

## 2021-04-21 DIAGNOSIS — H40053 Ocular hypertension, bilateral: Secondary | ICD-10-CM | POA: Diagnosis not present

## 2021-04-21 DIAGNOSIS — R7309 Other abnormal glucose: Secondary | ICD-10-CM | POA: Diagnosis not present

## 2021-04-21 DIAGNOSIS — H401131 Primary open-angle glaucoma, bilateral, mild stage: Secondary | ICD-10-CM | POA: Diagnosis not present

## 2021-04-21 DIAGNOSIS — Z961 Presence of intraocular lens: Secondary | ICD-10-CM | POA: Diagnosis not present

## 2021-04-21 DIAGNOSIS — Z7984 Long term (current) use of oral hypoglycemic drugs: Secondary | ICD-10-CM | POA: Diagnosis not present

## 2021-05-01 ENCOUNTER — Other Ambulatory Visit: Payer: Self-pay | Admitting: Internal Medicine

## 2021-05-01 NOTE — Telephone Encounter (Signed)
Requested medications are due for refill today.  yes  Requested medications are on the active medications list.  yes  Last refill. 03/24/2021  Future visit scheduled.   yes  Notes to clinic.  Medication is listed as historical med.

## 2021-05-05 ENCOUNTER — Other Ambulatory Visit: Payer: Self-pay | Admitting: Internal Medicine

## 2021-05-05 DIAGNOSIS — Z1231 Encounter for screening mammogram for malignant neoplasm of breast: Secondary | ICD-10-CM

## 2021-05-16 ENCOUNTER — Other Ambulatory Visit: Payer: Self-pay

## 2021-05-16 ENCOUNTER — Encounter: Payer: Self-pay | Admitting: Internal Medicine

## 2021-05-16 ENCOUNTER — Ambulatory Visit (INDEPENDENT_AMBULATORY_CARE_PROVIDER_SITE_OTHER): Payer: Medicare Other | Admitting: Internal Medicine

## 2021-05-16 VITALS — BP 118/86 | HR 81 | Temp 98.1°F | Ht 63.0 in | Wt 182.0 lb

## 2021-05-16 DIAGNOSIS — E785 Hyperlipidemia, unspecified: Secondary | ICD-10-CM | POA: Diagnosis not present

## 2021-05-16 DIAGNOSIS — Z0000 Encounter for general adult medical examination without abnormal findings: Secondary | ICD-10-CM

## 2021-05-16 DIAGNOSIS — Z1231 Encounter for screening mammogram for malignant neoplasm of breast: Secondary | ICD-10-CM | POA: Diagnosis not present

## 2021-05-16 DIAGNOSIS — E1169 Type 2 diabetes mellitus with other specified complication: Secondary | ICD-10-CM

## 2021-05-16 DIAGNOSIS — I10 Essential (primary) hypertension: Secondary | ICD-10-CM | POA: Diagnosis not present

## 2021-05-16 DIAGNOSIS — E118 Type 2 diabetes mellitus with unspecified complications: Secondary | ICD-10-CM | POA: Diagnosis not present

## 2021-05-16 LAB — POCT URINALYSIS DIPSTICK
Bilirubin, UA: NEGATIVE
Blood, UA: NEGATIVE
Glucose, UA: NEGATIVE
Ketones, UA: NEGATIVE
Leukocytes, UA: NEGATIVE
Nitrite, UA: NEGATIVE
Protein, UA: NEGATIVE
Spec Grav, UA: >=1.03 — AB (ref 1.010–1.025)
Urobilinogen, UA: 0.2 E.U./dL
pH, UA: 6 (ref 5.0–8.0)

## 2021-05-16 MED ORDER — LISINOPRIL 10 MG PO TABS: 10.0000 mg | ORAL_TABLET | Freq: Two times a day (BID) | ORAL | 1 refills | Status: DC

## 2021-05-16 NOTE — Progress Notes (Signed)
Date:  05/16/2021   Name:  Dawn Fields   DOB:  Jan 05, 1946   MRN:  [id number]   Chief Complaint: Annual Exam (Breast exam no pap ) Dawn Fields is a 75 y.o. female who presents today for her Complete Annual Exam. She feels well. She reports exercising walking X3 days a week. She reports she is sleeping well. Breast complaints none.  Mammogram: 06/2020 scheduled for 2022 DEXA: 08/2020 osteopenia Pap smear: discontinued Colonoscopy: 01/2018 repeat 5 yrs  Immunization History  Administered Date(s) Administered   Influenza, High Dose Seasonal PF 05/09/2018, 03/29/2019, 04/17/2020, 05/07/2021   Influenza, Seasonal, Injecte, Preservative Fre 05/09/2014   Influenza,inj,Quad PF,6+ Mos 06/02/2016   Influenza-Unspecified 04/08/2017, 04/30/2018, 03/31/2019   PFIZER Comirnaty(Gray Top)Covid-19 Tri-Sucrose Vaccine 11/08/2020   PFIZER(Purple Top)SARS-COV-2 Vaccination 08/16/2019, 09/06/2019, 05/06/2020   Pfizer Covid-19 Vaccine Bivalent Booster 33yrs & up 05/07/2021   Pneumococcal Conjugate-13 11/06/2013   Pneumococcal Polysaccharide-23 08/10/2010   Tdap 03/07/2010   Zoster Recombinat (Shingrix) 05/24/2018, 07/25/2018   Zoster, Live 11/02/2012    Diabetes She presents for her follow-up diabetic visit. She has type 2 diabetes mellitus. Her disease course has been improving. Pertinent negatives for hypoglycemia include no dizziness, headaches, nervousness/anxiousness or tremors. Pertinent negatives for diabetes include no chest pain, no fatigue, no polydipsia and no polyuria. Pertinent negatives for diabetic complications include no CVA. Current diabetic treatment includes diet. Her weight is stable. She is following a generally healthy diet. Her overall blood glucose range is 110-130 mg/dl. An ACE inhibitor/angiotensin II receptor blocker is being taken. Eye exam is current.  Hypertension This is a chronic problem. The problem is controlled. Pertinent negatives include no chest pain, headaches,  palpitations or shortness of breath. Past treatments include ACE inhibitors. There is no history of kidney disease, CAD/MI or CVA.  Hyperlipidemia This is a chronic problem. The problem is controlled. Pertinent negatives include no chest pain or shortness of breath. She is currently on no antihyperlipidemic treatment (could not tolerate crestor or lipitor). The current treatment provides moderate improvement of lipids.   Lab Results  Component Value Date   CREATININE 1.00 04/03/2021   BUN 21 04/03/2021   NA 137 04/03/2021   K 4.7 04/03/2021   CL 102 04/03/2021   CO2 26 03/28/2021   Lab Results  Component Value Date   CHOL 227 (H) 05/13/2020   HDL 81 05/13/2020   LDLCALC 127 (H) 05/13/2020   TRIG 109 05/13/2020   CHOLHDL 2.8 05/13/2020   Lab Results  Component Value Date   TSH 1.170 05/13/2020   Lab Results  Component Value Date   HGBA1C 6.4 (A) 01/28/2021   Lab Results  Component Value Date   WBC 6.5 03/28/2021   HGB 12.6 04/03/2021   HCT 37.0 04/03/2021   MCV 88.7 03/28/2021   PLT 279 03/28/2021   Lab Results  Component Value Date   ALT 30 05/13/2020   AST 24 05/13/2020   ALKPHOS 84 05/13/2020   BILITOT 0.3 05/13/2020     Review of Systems  Constitutional:  Negative for chills, fatigue and fever.  HENT:  Negative for congestion, hearing loss, tinnitus, trouble swallowing and voice change.   Eyes:  Negative for visual disturbance.  Respiratory:  Negative for cough, chest tightness, shortness of breath and wheezing.   Cardiovascular:  Negative for chest pain, palpitations and leg swelling.  Gastrointestinal:  Negative for abdominal pain, constipation, diarrhea and vomiting.  Endocrine: Negative for polydipsia and polyuria.  Genitourinary:  Negative for dysuria, frequency, genital  sores, vaginal bleeding and vaginal discharge.  Musculoskeletal:  Negative for arthralgias, gait problem and joint swelling.  Skin:  Negative for color change and rash.  Neurological:   Negative for dizziness, tremors, light-headedness and headaches.  Hematological:  Negative for adenopathy. Does not bruise/bleed easily.  Psychiatric/Behavioral:  Negative for dysphoric mood and sleep disturbance. The patient is not nervous/anxious.    Patient Active Problem List   Diagnosis Date Noted   Lipoma of right shoulder    Lipoma of neck 09/26/2020   Lipoma of anterior chest wall 01/04/2020   Radial scar of left breast    Abnormal breast biopsy 09/01/2019   Myalgia due to statin 08/13/2019   Cataracts, both eyes 04/12/2019   Carpal tunnel syndrome of right wrist 12/26/2018   Type II diabetes mellitus with complication ([place]) 43/15/4008   Knee instability, left 12/24/2017   History of colonic polyps 12/16/2016   Essential hypertension 12/11/2016   Hyperlipidemia associated with type 2 diabetes mellitus ([place]) 12/11/2016   Gastroesophageal reflux disease 12/11/2016   Kidney stones 12/11/2016   Osteopenia 12/11/2016   Multinodular goiter 06/09/2016    Allergies  Allergen Reactions   Esomeprazole Magnesium Other (See Comments)    Other Reaction: GI UPSET   Rosuvastatin Other (See Comments)    Joint and muscle pain    Past Surgical History:  Procedure Laterality Date   ABDOMINAL HYSTERECTOMY     Partial - Still have ovaries. - Cyst on uterus    BREAST BIOPSY Left 07/20/2019   sclerosing lesion   BREAST BIOPSY Left 09/29/2019   NL-neg   breast lesion removed Left 2021   BREAST LUMPECTOMY WITH NEEDLE LOCALIZATION Left 09/29/2019   Procedure: BREAST LUMPECTOMY WITH NEEDLE LOCALIZATION;  Surgeon: Olean Ree, MD;  Location: [facility] ORS;  Service: General;  Laterality: Left;   BREAST SURGERY     biopsy left breast   COLONOSCOPY  02/04/2018   one polyp -    DILATION AND CURETTAGE OF UTERUS     EYE SURGERY     cateract bilateral   fibroid biopsy     LIPOMA EXCISION Right 04/03/2021   Procedure: EXCISION LIPOMA, right supraclavicular & right shoulder lipomas x 2;   Surgeon: Olean Ree, MD;  Location: [facility] ORS;  Service: General;  Laterality: Right;  Right supraclavicular and right shoulder lipomas   TONSILLECTOMY      Social History   Tobacco Use   Smoking status: Never   Smokeless tobacco: Never  Vaping Use   Vaping Use: Never used  Substance Use Topics   Alcohol use: No   Drug use: No     Medication list has been reviewed and updated.  Current Meds  Medication Sig   acetaminophen (TYLENOL) 500 MG tablet Take 2 tablets (1,000 mg total) by mouth every 6 (six) hours as needed for mild pain.   aspirin EC 81 MG tablet Take 81 mg by mouth daily.   Cholecalciferol 25 MCG (1000 UT) tablet Take 1,000 Units by mouth daily.    docusate sodium (COLACE) 100 MG capsule Take 100 mg by mouth 2 (two) times daily as needed.    latanoprost (XALATAN) 0.005 % ophthalmic solution Place 1 drop into both eyes at bedtime.   lisinopril (ZESTRIL) 10 MG tablet TAKE 2 TABLETS (20 MG) BY MOUTH EVERY DAY   Omega-3 Fatty Acids (FISH OIL) 1000 MG CAPS Take 2,000 mg by mouth daily.    OneTouch Delica Lancets 67Y MISC USE AS DIRECTED ONCE DAILY   ONETOUCH ULTRA  test strip USE AS DIRECTED ONCE DAILY   [DISCONTINUED] ibuprofen (ADVIL) 600 MG tablet Take 1 tablet (600 mg total) by mouth every 8 (eight) hours as needed for moderate pain.    PHQ 2/9 Scores 01/28/2021 09/26/2020 07/10/2020 05/13/2020  PHQ - 2 Score 0 0 0 0  PHQ- 9 Score 1 0 - 0    GAD 7 : Generalized Anxiety Score 01/28/2021 09/26/2020 05/13/2020 01/04/2020  Nervous, Anxious, on Edge 1 0 0 0  Control/stop worrying - 0 0 0  Worry too much - different things 0 0 0 0  Trouble relaxing 0 0 0 0  Restless 0 0 0 0  Easily annoyed or irritable 0 0 0 0  Afraid - awful might happen 0 0 0 0  Total GAD 7 Score - 0 0 0  Anxiety Difficulty Not difficult at all - Not difficult at all Not difficult at all    BP Readings from Last 3 Encounters:  05/16/21 118/86  04/18/21 135/84  04/03/21 (!) 159/74    Physical  Exam Vitals and nursing note reviewed.  Constitutional:      General: She is not in acute distress.    Appearance: She is well-developed.  HENT:     Head: Normocephalic and atraumatic.     Right Ear: Tympanic membrane and ear canal normal.     Left Ear: Tympanic membrane and ear canal normal.     Nose:     Right Sinus: No maxillary sinus tenderness.     Left Sinus: No maxillary sinus tenderness.  Eyes:     General: No scleral icterus.       Right eye: No discharge.        Left eye: No discharge.     Conjunctiva/sclera: Conjunctivae normal.  Neck:     Thyroid: No thyromegaly.     Vascular: No carotid bruit.      Comments: Well healed surgical scars on the right neck from lipoma removal Cardiovascular:     Rate and Rhythm: Normal rate and regular rhythm.     Pulses: Normal pulses.     Heart sounds: Normal heart sounds.  Pulmonary:     Effort: Pulmonary effort is normal. No respiratory distress.     Breath sounds: No wheezing.  Chest:  Breasts:    Right: No mass, nipple discharge, skin change or tenderness.     Left: No mass, nipple discharge, skin change or tenderness.  Abdominal:     General: Bowel sounds are normal.     Palpations: Abdomen is soft.     Tenderness: There is no abdominal tenderness.  Musculoskeletal:     Cervical back: Normal range of motion. No erythema.     Right lower leg: No edema.     Left lower leg: No edema.  Lymphadenopathy:     Cervical: No cervical adenopathy.  Skin:    General: Skin is warm and dry.     Findings: No rash.  Neurological:     Mental Status: She is alert and oriented to person, place, and time.     Cranial Nerves: No cranial nerve deficit.     Sensory: No sensory deficit.     Deep Tendon Reflexes: Reflexes are normal and symmetric.  Psychiatric:        Attention and Perception: Attention normal.        Mood and Affect: Mood normal.    Wt Readings from Last 3 Encounters:  05/16/21 182 lb (82.6 kg)  04/18/21 183 lb (  83  kg)  04/03/21 187 lb (84.8 kg)    BP 118/86   Pulse 81   Temp 98.1 F (36.7 C) (Oral)   Ht 5\' 3"  (1.6 m)   Wt 182 lb (82.6 kg)   SpO2 98%   BMI 32.24 kg/m   Assessment and Plan: 1. Annual physical exam Normal exam except for weight Continue healthy diet, exercise She is up to date on all screenings and immunizations  2. Encounter for screening mammogram for breast cancer scheduled  3. Essential hypertension Clinically stable exam with well controlled BP. Tolerating medications without side effects at this time. Pt to continue current regimen and low sodium diet; benefits of regular exercise as able discussed. - CBC with Differential/Platelet - TSH - POCT urinalysis dipstick - lisinopril (ZESTRIL) 10 MG tablet; Take 1 tablet (10 mg total) by mouth 2 (two) times daily.  Dispense: 180 tablet; Refill: 1  4. Type II diabetes mellitus with complication ([facility]) Clinically stable by exam and report without s/s of hypoglycemia. DM complicated by hypertension and dyslipidemia. Continue diet, exercise - Comprehensive metabolic panel - Hemoglobin A1c  5. Hyperlipidemia associated with type 2 diabetes mellitus ([facility]) Intolerant of several statins Consider Zetia - Lipid panel    Partially dictated using Editor, commissioning. Any errors are unintentional.  Halina Maidens, MD [facility]  05/16/2021

## 2021-05-17 LAB — CBC WITH DIFFERENTIAL/PLATELET
Basophils Absolute: 0.1 10*3/uL (ref 0.0–0.2)
Basos: 1 %
EOS (ABSOLUTE): 0.2 10*3/uL (ref 0.0–0.4)
Eos: 4 %
Hematocrit: 37 % (ref 34.0–46.6)
Hemoglobin: 12.1 g/dL (ref 11.1–15.9)
Immature Grans (Abs): 0 10*3/uL (ref 0.0–0.1)
Immature Granulocytes: 0 %
Lymphocytes Absolute: 2.8 10*3/uL (ref 0.7–3.1)
Lymphs: 46 %
MCH: 29.2 pg (ref 26.6–33.0)
MCHC: 32.7 g/dL (ref 31.5–35.7)
MCV: 89 fL (ref 79–97)
Monocytes Absolute: 0.5 10*3/uL (ref 0.1–0.9)
Monocytes: 8 %
Neutrophils Absolute: 2.5 10*3/uL (ref 1.4–7.0)
Neutrophils: 41 %
Platelets: 305 10*3/uL (ref 150–450)
RBC: 4.14 x10E6/uL (ref 3.77–5.28)
RDW: 12.6 % (ref 11.7–15.4)
WBC: 6 10*3/uL (ref 3.4–10.8)

## 2021-05-17 LAB — LIPID PANEL
Chol/HDL Ratio: 2.7 ratio (ref 0.0–4.4)
Cholesterol, Total: 228 mg/dL — ABNORMAL HIGH (ref 100–199)
HDL: 83 mg/dL (ref >39)
LDL Chol Calc (NIH): 126 mg/dL — ABNORMAL HIGH (ref 0–99)
Triglycerides: 110 mg/dL (ref 0–149)
VLDL Cholesterol Cal: 19 mg/dL (ref 5–40)

## 2021-05-17 LAB — COMPREHENSIVE METABOLIC PANEL
ALT: 36 IU/L — ABNORMAL HIGH (ref 0–32)
AST: 30 IU/L (ref 0–40)
Albumin/Globulin Ratio: 1.6 (ref 1.2–2.2)
Albumin: 4.5 g/dL (ref 3.7–4.7)
Alkaline Phosphatase: 91 IU/L (ref 44–121)
BUN/Creatinine Ratio: 17 (ref 12–28)
BUN: 15 mg/dL (ref 8–27)
Bilirubin Total: 0.3 mg/dL (ref 0.0–1.2)
CO2: 19 mmol/L — ABNORMAL LOW (ref 20–29)
Calcium: 9.9 mg/dL (ref 8.7–10.3)
Chloride: 103 mmol/L (ref 96–106)
Creatinine, Ser: 0.86 mg/dL (ref 0.57–1.00)
Globulin, Total: 2.8 g/dL (ref 1.5–4.5)
Glucose: 116 mg/dL — ABNORMAL HIGH (ref 70–99)
Potassium: 4.6 mmol/L (ref 3.5–5.2)
Sodium: 139 mmol/L (ref 134–144)
Total Protein: 7.3 g/dL (ref 6.0–8.5)
eGFR: 70 mL/min/{1.73_m2} (ref >59)

## 2021-05-17 LAB — HEMOGLOBIN A1C
Est. average glucose Bld gHb Est-mCnc: 146 mg/dL
Hgb A1c MFr Bld: 6.7 % — ABNORMAL HIGH (ref 4.8–5.6)

## 2021-05-17 LAB — TSH: TSH: 1.5 u[IU]/mL (ref 0.450–4.500)

## 2021-05-18 ENCOUNTER — Other Ambulatory Visit: Payer: Self-pay | Admitting: Internal Medicine

## 2021-05-18 DIAGNOSIS — E1169 Type 2 diabetes mellitus with other specified complication: Secondary | ICD-10-CM

## 2021-05-18 DIAGNOSIS — E785 Hyperlipidemia, unspecified: Secondary | ICD-10-CM

## 2021-05-18 MED ORDER — EZETIMIBE 10 MG PO TABS: 10.0000 mg | ORAL_TABLET | Freq: Every day | ORAL | 3 refills | Status: DC

## 2021-05-19 NOTE — Progress Notes (Signed)
Called pt left VM to call back. ? ?[facility] nurse may give results to patient if they return call to clinic, a CRM has been created. ? ?KP

## 2021-06-18 ENCOUNTER — Other Ambulatory Visit: Payer: Self-pay

## 2021-06-18 ENCOUNTER — Ambulatory Visit
Admission: RE | Admit: 2021-06-18 | Discharge: 2021-06-18 | Disposition: A | Discharge status: Home / Self Care | Payer: Medicare Other | Source: Ambulatory Visit | Attending: Internal Medicine | Admitting: Internal Medicine

## 2021-06-18 DIAGNOSIS — Z1231 Encounter for screening mammogram for malignant neoplasm of breast: Secondary | ICD-10-CM | POA: Diagnosis not present

## 2021-07-14 ENCOUNTER — Ambulatory Visit (INDEPENDENT_AMBULATORY_CARE_PROVIDER_SITE_OTHER): Payer: Medicare Other

## 2021-07-14 DIAGNOSIS — Z0000 Encounter for general adult medical examination without abnormal findings: Secondary | ICD-10-CM | POA: Diagnosis not present

## 2021-07-14 NOTE — Progress Notes (Signed)
Subjective:   Dawn Fields is a 75 y.o. female who presents for Medicare Annual (Subsequent) preventive examination.  Virtual Visit via Telephone Note  I connected with  Evern Bio on 07/14/21 at 10:00 AM EST by telephone and verified that I am speaking with the correct person using two identifiers.  Location: Patient: home Provider: [facility] Persons participating in the virtual visit: [place]   I discussed the limitations, risks, security and privacy concerns of performing an evaluation and management service by telephone and the availability of in person appointments. The patient expressed understanding and agreed to proceed.  Interactive audio and video telecommunications were attempted between this nurse and patient, however failed, due to patient having technical difficulties OR patient did not have access to video capability.  We continued and completed visit with audio only.  Some vital signs may be absent or patient reported.   Clemetine Marker, LPN   Review of Systems     Cardiac Risk Factors include: advanced age (>75men, >2 women);diabetes mellitus;dyslipidemia;hypertension;obesity (BMI >30kg/m2)     Objective:    There were no vitals filed for this visit. There is no height or weight on file to calculate BMI.  Advanced Directives 07/14/2021 04/03/2021 03/27/2021 07/10/2020 09/19/2019 07/10/2019 06/29/2018  Does Patient Have a Medical Advance Directive? Yes Yes Yes Yes Yes Yes Yes  Type of Paramedic of [place];Living will [place];Living will - [place];Living will [place];Living will [place];Living will [place];Living will  Does patient want to make changes to medical advance directive? - No - Patient declined - - No - Patient declined No - Patient declined -  Copy of [place] in Chart? No - copy requested No -  copy requested - No - copy requested No - copy requested No - copy requested No - copy requested  Would patient like information on creating a medical advance directive? - - - - - - -    Current Medications (verified) Outpatient Encounter Medications as of 07/14/2021  Medication Sig   acetaminophen (TYLENOL) 500 MG tablet Take 2 tablets (1,000 mg total) by mouth every 6 (six) hours as needed for mild pain.   aspirin EC 81 MG tablet Take 81 mg by mouth daily.   Cholecalciferol 25 MCG (1000 UT) tablet Take 1,000 Units by mouth daily.    docusate sodium (COLACE) 100 MG capsule Take 100 mg by mouth 2 (two) times daily as needed.    ezetimibe (ZETIA) 10 MG tablet Take 1 tablet (10 mg total) by mouth daily.   latanoprost (XALATAN) 0.005 % ophthalmic solution Place 1 drop into both eyes at bedtime.   lisinopril (ZESTRIL) 10 MG tablet Take 1 tablet (10 mg total) by mouth 2 (two) times daily.   Omega-3 Fatty Acids (FISH OIL) 1000 MG CAPS Take 2,000 mg by mouth daily.    OneTouch Delica Lancets 56D MISC USE AS DIRECTED ONCE DAILY   ONETOUCH ULTRA test strip USE AS DIRECTED ONCE DAILY   No facility-administered encounter medications on file as of 07/14/2021.    Allergies (verified) Esomeprazole magnesium, Rosuvastatin, and Atorvastatin   History: Past Medical History:  Diagnosis Date   Allergy Pollen   Carpal tunnel syndrome of right wrist    Cataract Surgery Pending   GERD (gastroesophageal reflux disease)    History of kidney stones    Hyperlipidemia    Hypertension    Kidney stones  Multinodular goiter    Type II diabetes mellitus with complication (HCC)    Past Surgical History:  Procedure Laterality Date   ABDOMINAL HYSTERECTOMY     Partial - Still have ovaries. - Cyst on uterus    BREAST BIOPSY Left 07/20/2019   sclerosing lesion   BREAST BIOPSY Left 09/29/2019   NL-neg   breast lesion removed Left 2021   BREAST LUMPECTOMY WITH NEEDLE LOCALIZATION Left 09/29/2019    Procedure: BREAST LUMPECTOMY WITH NEEDLE LOCALIZATION;  Surgeon: Olean Ree, MD;  Location: [facility] ORS;  Service: General;  Laterality: Left;   BREAST SURGERY     biopsy left breast   COLONOSCOPY  02/04/2018   one polyp -    DILATION AND CURETTAGE OF UTERUS     EYE SURGERY     cateract bilateral   fibroid biopsy     LIPOMA EXCISION Right 04/03/2021   Procedure: EXCISION LIPOMA, right supraclavicular & right shoulder lipomas x 2;  Surgeon: Olean Ree, MD;  Location: [facility] ORS;  Service: General;  Laterality: Right;  Right supraclavicular and right shoulder lipomas   TONSILLECTOMY     Family History  Problem Relation Age of Onset   Stroke Mother    Hypertension Mother    Liver cancer Father    Cancer Father    COPD Sister    Diabetes Sister    Stroke Sister    Diabetes Brother    Stroke Brother    Hearing loss Brother    Diabetes Maternal Grandmother    Diabetes Maternal Grandfather    Diabetes Sister    Mental illness Sister    Breast cancer Maternal Aunt    Breast cancer Cousin        mat cousin   Social History   Socioeconomic History   Marital status: Single    Spouse name: Not on file   Number of children: 1   Years of education: Not on file   Highest education level: Associate degree: academic program  Occupational History   Occupation: retired    Comment: Theatre manager  Tobacco Use   Smoking status: Never   Smokeless tobacco: Never  Vaping Use   Vaping Use: Never used  Substance and Sexual Activity   Alcohol use: No   Drug use: No   Sexual activity: Not Currently    Birth control/protection: Other-see comments    Comment: Age  Other Topics Concern   Not on file  Social History Narrative   Lives alone   Social Determinants of Health   Financial Resource Strain: Low Risk    Difficulty of Paying Living Expenses: Not hard at all  Food Insecurity: No Food Insecurity   Worried About Charity fundraiser in the Last Year: Never true   Youth worker in the Last Year: Never true  Transportation Needs: No Transportation Needs   Lack of Transportation (Medical): No   Lack of Transportation (Non-Medical): No  Physical Activity: Sufficiently Active   Days of Exercise per Week: 3 days   Minutes of Exercise per Session: 60 min  Stress: No Stress Concern Present   Feeling of Stress : Not at all  Social Connections: Socially Isolated   Frequency of Communication with Friends and Family: More than three times a week   Frequency of Social Gatherings with Friends and Family: Once a week   Attends Religious Services: Never   Marine scientist or Organizations: No   Attends Archivist Meetings: Never  Marital Status: Never married    Tobacco Counseling Counseling given: Not Answered   Clinical Intake:  Pre-visit preparation completed: Yes  Pain : No/denies pain     Nutritional Risks: None Diabetes: Yes CBG done?: No Did pt. bring in CBG monitor from home?: No  How often do you need to have someone help you when you read instructions, pamphlets, or other written materials from your doctor or pharmacy?: 1 - Never  Nutrition Risk Assessment:  Has the patient had any N/V/D within the last 2 months?  No  Does the patient have any non-healing wounds?  No  Has the patient had any unintentional weight loss or weight gain?  No   Diabetes:  Is the patient diabetic?  Yes  If diabetic, was a CBG obtained today?  No  Did the patient bring in their glucometer from home?  No  How often do you monitor your CBG's? daily.   Financial Strains and Diabetes Management:  Are you having any financial strains with the device, your supplies or your medication? No .  Does the patient want to be seen by Chronic Care Management for management of their diabetes?  No  Would the patient like to be referred to a Nutritionist or for Diabetic Management?  No   Diabetic Exams:  Diabetic Eye Exam: Completed 01/13/21.  Diabetic  Foot Exam: Completed 05/16/21.   Interpreter Needed?: No  Information entered by :: Clemetine Marker LPN   Activities of Daily Living In your present state of health, do you have any difficulty performing the following activities: 07/14/2021 05/16/2021  Hearing? N N  Vision? N N  Comment - -  Difficulty concentrating or making decisions? N N  Walking or climbing stairs? N N  Dressing or bathing? N N  Doing errands, shopping? N N  Preparing Food and eating ? N -  Using the Toilet? N -  In the past six months, have you accidently leaked urine? N -  Do you have problems with loss of bowel control? N -  Managing your Medications? N -  Managing your Finances? N -  Housekeeping or managing your Housekeeping? N -  Some recent data might be hidden    Patient Care Team: Glean Hess, MD as PCP - General (Internal Medicine) Margaretha Sheffield, MD (Otolaryngology) Luster Landsberg, MD as Referring Physician (Gastroenterology) Stanford Breed, MD as Referring Physician (Cardiology) Idelle Leech, OD (Optometry)  Indicate any recent Medical Services you may have received from other than [facility] providers in the past year (date may be approximate).     Assessment:   This is a routine wellness examination for [facility].  Hearing/Vision screen Hearing Screening - Comments:: Pt denies hearing difficulty Vision Screening - Comments:: Annual vision screenings with Dr. Matilde Sprang  Dietary issues and exercise activities discussed: Current Exercise Habits: Home exercise routine, Type of exercise: walking, Time (Minutes): 60, Frequency (Times/Week): 3, Weekly Exercise (Minutes/Week): 180, Intensity: Moderate, Exercise limited by: None identified   Goals Addressed             This Visit's Progress    DIET - REDUCE FAT INTAKE   On track    Patient would like to lower overall cholesterol with diet and exercise.      Increase physical activity   On track    Recommend increasing physical activity  to 150 minutes per week       Depression Screen [facility] 2/9 Scores 07/14/2021 05/16/2021 01/28/2021 09/26/2020 07/10/2020 05/13/2020 01/04/2020  PHQ -  2 Score 0 0 0 0 0 0 0  PHQ- 9 Score 0 1 1 0 - 0 0    Fall Risk Fall Risk  07/14/2021 05/16/2021 01/28/2021 09/26/2020 07/10/2020  Falls in the past year? 0 0 0 0 0  Number falls in past yr: 0 0 0 - 0  Injury with Fall? 0 0 0 - 0  Risk for fall due to : No Fall Risks No Fall Risks - - No Fall Risks  Follow up Falls prevention discussed Falls evaluation completed Falls evaluation completed Falls evaluation completed Falls prevention discussed    FALL RISK PREVENTION PERTAINING TO THE HOME:  Any stairs in or around the home? Yes  If so, are there any without handrails? No  Home free of loose throw rugs in walkways, pet beds, electrical cords, etc? Yes  Adequate lighting in your home to reduce risk of falls? Yes   ASSISTIVE DEVICES UTILIZED TO PREVENT FALLS:  Life alert? No  Use of a cane, walker or w/c? No  Grab bars in the bathroom? No  Shower chair or bench in shower? No  Elevated toilet seat or a handicapped toilet? Yes   TIMED UP AND GO:  Was the test performed? No . Telephonic visit.   Cognitive Function: Normal cognitive status assessed by direct observation by this Nurse Health Advisor. No abnormalities found.       6CIT Screen 07/10/2019 06/23/2017  What Year? 0 points 0 points  What month? 0 points 0 points  What time? 0 points 0 points  Count back from 20 0 points 0 points  Months in reverse 0 points 0 points  Repeat phrase 0 points 0 points  Total Score 0 0    Immunizations Immunization History  Administered Date(s) Administered   Influenza, High Dose Seasonal PF 05/09/2018, 03/29/2019, 04/17/2020, 05/07/2021   Influenza, Seasonal, Injecte, Preservative Fre 05/09/2014   Influenza,inj,Quad PF,6+ Mos 06/02/2016   Influenza-Unspecified 04/08/2017, 04/30/2018, 03/31/2019   PFIZER Comirnaty(Gray Top)Covid-19 Tri-Sucrose  Vaccine 11/08/2020   PFIZER(Purple Top)SARS-COV-2 Vaccination 08/16/2019, 09/06/2019, 05/06/2020   Pfizer Covid-19 Vaccine Bivalent Booster 73yrs & up 05/07/2021   Pneumococcal Conjugate-13 11/06/2013   Pneumococcal Polysaccharide-23 08/10/2010   Tdap 03/07/2010   Zoster Recombinat (Shingrix) 05/24/2018, 07/25/2018   Zoster, Live 11/02/2012    TDAP status: Due, Education has been provided regarding the importance of this vaccine. Advised may receive this vaccine at local pharmacy or Health Dept. Aware to provide a copy of the vaccination record if obtained from local pharmacy or Health Dept. Verbalized acceptance and understanding.  Flu Vaccine status: Up to date  Pneumococcal vaccine status: Up to date  Covid-19 vaccine status: Completed vaccines  Qualifies for Shingles Vaccine? Yes   Zostavax completed Yes   Shingrix Completed?: Yes  Screening Tests Health Maintenance  Topic Date Due   TETANUS/TDAP  09/26/2021 (Originally 03/07/2020)   HEMOGLOBIN A1C  11/14/2021   OPHTHALMOLOGY EXAM  01/13/2022   FOOT EXAM  05/16/2022   MAMMOGRAM  06/18/2022   Pneumonia Vaccine 84+ Years old  Completed   INFLUENZA VACCINE  Completed   DEXA SCAN  Completed   COVID-19 Vaccine  Completed   Hepatitis C Screening  Completed   Zoster Vaccines- Shingrix  Completed   HPV VACCINES  Aged Out   COLONOSCOPY (Pts 45-93yrs Insurance coverage will need to be confirmed)  Discontinued    Health Maintenance  There are no preventive care reminders to display for this patient.  Colorectal cancer screening: No longer required.  Mammogram status: Completed 06/18/21. Repeat every year  Bone Density status: Completed 08/28/20. Results reflect: Bone density results: OSTEOPENIA. Repeat every 2 years.  Lung Cancer Screening: (Low Dose CT Chest recommended if Age 105-80 years, 30 pack-year currently smoking OR have quit w/in 15years.) does not qualify.   Additional Screening:  Hepatitis C Screening: does  qualify; Completed 12/04/13  Vision Screening: Recommended annual ophthalmology exams for early detection of glaucoma and other disorders of the eye. Is the patient up to date with their annual eye exam?  Yes  Who is the provider or what is the name of the office in which the patient attends annual eye exams? Dr. Matilde Sprang.   Dental Screening: Recommended annual dental exams for proper oral hygiene  Community Resource Referral / Chronic Care Management: CRR required this visit?  No   CCM required this visit?  No      Plan:     I have personally reviewed and noted the following in the patient's chart:   Medical and social history Use of alcohol, tobacco or illicit drugs  Current medications and supplements including opioid prescriptions.  Functional ability and status Nutritional status Physical activity Advanced directives List of other physicians Hospitalizations, surgeries, and ER visits in previous 12 months Vitals Screenings to include cognitive, depression, and falls Referrals and appointments  In addition, I have reviewed and discussed with patient certain preventive protocols, quality metrics, and best practice recommendations. A written personalized care plan for preventive services as well as general preventive health recommendations were provided to patient.     Clemetine Marker, LPN   08/16/4942   Nurse Notes: none

## 2021-07-14 NOTE — Patient Instructions (Signed)
Ms. Dawn Fields , Thank you for taking time to come for your Medicare Wellness Visit. I appreciate your ongoing commitment to your health goals. Please review the following plan we discussed and let me know if I can assist you in the future.   Screening recommendations/referrals: Colonoscopy: no longer required Mammogram: done 06/18/21 Bone Density: done 08/28/20 Recommended yearly ophthalmology/optometry visit for glaucoma screening and checkup Recommended yearly dental visit for hygiene and checkup  Vaccinations: Influenza vaccine: done 05/07/21 Pneumococcal vaccine: done 11/06/13 Tdap vaccine: due Shingles vaccine: done 05/24/18 & 07/25/18   Covid-19:done 08/16/19, 09/06/19, 05/06/20 & 05/07/21  Advanced directives: Please bring a copy of your health care power of attorney and living will to the office at your convenience.   Conditions/risks identified: Keep up the great work!  Next appointment: Follow up in one year for your annual wellness visit    Preventive Care 65 Years and Older, Female Preventive care refers to lifestyle choices and visits with your health care provider that can promote health and wellness. What does preventive care include? A yearly physical exam. This is also called an annual well check. Dental exams once or twice a year. Routine eye exams. Ask your health care provider how often you should have your eyes checked. Personal lifestyle choices, including: Daily care of your teeth and gums. Regular physical activity. Eating a healthy diet. Avoiding tobacco and drug use. Limiting alcohol use. Practicing safe sex. Taking low-dose aspirin every day. Taking vitamin and mineral supplements as recommended by your health care provider. What happens during an annual well check? The services and screenings done by your health care provider during your annual well check will depend on your age, overall health, lifestyle risk factors, and family history of disease. Counseling   Your health care provider may ask you questions about your: Alcohol use. Tobacco use. Drug use. Emotional well-being. Home and relationship well-being. Sexual activity. Eating habits. History of falls. Memory and ability to understand (cognition). Work and work Statistician. Reproductive health. Screening  You may have the following tests or measurements: Height, weight, and BMI. Blood pressure. Lipid and cholesterol levels. These may be checked every 5 years, or more frequently if you are over 1 years old. Skin check. Lung cancer screening. You may have this screening every year starting at age 77 if you have a 30-pack-year history of smoking and currently smoke or have quit within the past 15 years. Fecal occult blood test (FOBT) of the stool. You may have this test every year starting at age 77 Flexible sigmoidoscopy or colonoscopy. You may have a sigmoidoscopy every 5 years or a colonoscopy every 10 years starting at age 32. Hepatitis C blood test. Hepatitis B blood test. Sexually transmitted disease (STD) testing. Diabetes screening. This is done by checking your blood sugar (glucose) after you have not eaten for a while (fasting). You may have this done every 1-3 years. Bone density scan. This is done to screen for osteoporosis. You may have this done starting at age 77 Mammogram. This may be done every 1-2 years. Talk to your health care provider about how often you should have regular mammograms. Talk with your health care provider about your test results, treatment options, and if necessary, the need for more tests. Vaccines  Your health care provider may recommend certain vaccines, such as: Influenza vaccine. This is recommended every year. Tetanus, diphtheria, and acellular pertussis (Tdap, Td) vaccine. You may need a Td booster every 10 years. Zoster vaccine. You may need this  after age 70. Pneumococcal 13-valent conjugate (PCV13) vaccine. One dose is recommended  after age 77 Pneumococcal polysaccharide (PPSV23) vaccine. One dose is recommended after age 67. Talk to your health care provider about which screenings and vaccines you need and how often you need them. This information is not intended to replace advice given to you by your health care provider. Make sure you discuss any questions you have with your health care provider. Document Released: 08/23/2015 Document Revised: 04/15/2016 Document Reviewed: 05/28/2015 Elsevier Interactive Patient Education  2017 [place] Prevention in the Home Falls can cause injuries. They can happen to people of all ages. There are many things you can do to make your home safe and to help prevent falls. What can I do on the outside of my home? Regularly fix the edges of walkways and driveways and fix any cracks. Remove anything that might make you trip as you walk through a door, such as a raised step or threshold. Trim any bushes or trees on the path to your home. Use bright outdoor lighting. Clear any walking paths of anything that might make someone trip, such as rocks or tools. Regularly check to see if handrails are loose or broken. Make sure that both sides of any steps have handrails. Any raised decks and porches should have guardrails on the edges. Have any leaves, snow, or ice cleared regularly. Use sand or salt on walking paths during winter. Clean up any spills in your garage right away. This includes oil or grease spills. What can I do in the bathroom? Use night lights. Install grab bars by the toilet and in the tub and shower. Do not use towel bars as grab bars. Use non-skid mats or decals in the tub or shower. If you need to sit down in the shower, use a plastic, non-slip stool. Keep the floor dry. Clean up any water that spills on the floor as soon as it happens. Remove soap buildup in the tub or shower regularly. Attach bath mats securely with double-sided non-slip rug tape. Do not  have throw rugs and other things on the floor that can make you trip. What can I do in the bedroom? Use night lights. Make sure that you have a light by your bed that is easy to reach. Do not use any sheets or blankets that are too big for your bed. They should not hang down onto the floor. Have a firm chair that has side arms. You can use this for support while you get dressed. Do not have throw rugs and other things on the floor that can make you trip. What can I do in the kitchen? Clean up any spills right away. Avoid walking on wet floors. Keep items that you use a lot in easy-to-reach places. If you need to reach something above you, use a strong step stool that has a grab bar. Keep electrical cords out of the way. Do not use floor polish or wax that makes floors slippery. If you must use wax, use non-skid floor wax. Do not have throw rugs and other things on the floor that can make you trip. What can I do with my stairs? Do not leave any items on the stairs. Make sure that there are handrails on both sides of the stairs and use them. Fix handrails that are broken or loose. Make sure that handrails are as long as the stairways. Check any carpeting to make sure that it is firmly attached to the  stairs. Fix any carpet that is loose or worn. Avoid having throw rugs at the top or bottom of the stairs. If you do have throw rugs, attach them to the floor with carpet tape. Make sure that you have a light switch at the top of the stairs and the bottom of the stairs. If you do not have them, ask someone to add them for you. What else can I do to help prevent falls? Wear shoes that: Do not have high heels. Have rubber bottoms. Are comfortable and fit you well. Are closed at the toe. Do not wear sandals. If you use a stepladder: Make sure that it is fully opened. Do not climb a closed stepladder. Make sure that both sides of the stepladder are locked into place. Ask someone to hold it for  you, if possible. Clearly mark and make sure that you can see: Any grab bars or handrails. First and last steps. Where the edge of each step is. Use tools that help you move around (mobility aids) if they are needed. These include: Canes. Walkers. Scooters. Crutches. Turn on the lights when you go into a dark area. Replace any light bulbs as soon as they burn out. Set up your furniture so you have a clear path. Avoid moving your furniture around. If any of your floors are uneven, fix them. If there are any pets around you, be aware of where they are. Review your medicines with your doctor. Some medicines can make you feel dizzy. This can increase your chance of falling. Ask your doctor what other things that you can do to help prevent falls. This information is not intended to replace advice given to you by your health care provider. Make sure you discuss any questions you have with your health care provider. Document Released: 05/23/2009 Document Revised: 01/02/2016 Document Reviewed: 08/31/2014 Elsevier Interactive Patient Education  2017 [employer].

## 2021-07-16 ENCOUNTER — Ambulatory Visit: Payer: Medicare Other

## 2021-07-17 ENCOUNTER — Other Ambulatory Visit: Payer: Self-pay | Admitting: Internal Medicine

## 2021-07-17 NOTE — Telephone Encounter (Signed)
Discontinued 05/16/21. Not on current med profile.

## 2021-07-21 DIAGNOSIS — Z961 Presence of intraocular lens: Secondary | ICD-10-CM | POA: Diagnosis not present

## 2021-07-21 DIAGNOSIS — R7309 Other abnormal glucose: Secondary | ICD-10-CM | POA: Diagnosis not present

## 2021-07-21 DIAGNOSIS — Z7984 Long term (current) use of oral hypoglycemic drugs: Secondary | ICD-10-CM | POA: Diagnosis not present

## 2021-07-21 DIAGNOSIS — H401131 Primary open-angle glaucoma, bilateral, mild stage: Secondary | ICD-10-CM | POA: Diagnosis not present

## 2021-07-21 DIAGNOSIS — H40053 Ocular hypertension, bilateral: Secondary | ICD-10-CM | POA: Diagnosis not present

## 2021-08-15 ENCOUNTER — Other Ambulatory Visit: Payer: Self-pay | Admitting: Internal Medicine

## 2021-11-06 ENCOUNTER — Telehealth: Payer: Self-pay

## 2021-11-06 NOTE — Telephone Encounter (Signed)
Called pt and left her a VM letting her know we have to cancel her appt on April 6th of next month, because Dr Gaspar Cola husband is having a procedure done. ? ?Pt can reschedule her appt any time she prefers for office for hypertension/ diabetes. ? ?CRM created. ?

## 2021-11-13 ENCOUNTER — Ambulatory Visit: Payer: Medicare Other | Admitting: Internal Medicine

## 2021-11-14 ENCOUNTER — Ambulatory Visit: Payer: Medicare Other | Admitting: Internal Medicine

## 2021-11-18 DIAGNOSIS — Z7984 Long term (current) use of oral hypoglycemic drugs: Secondary | ICD-10-CM | POA: Diagnosis not present

## 2021-11-18 DIAGNOSIS — H40053 Ocular hypertension, bilateral: Secondary | ICD-10-CM | POA: Diagnosis not present

## 2021-11-18 DIAGNOSIS — Z961 Presence of intraocular lens: Secondary | ICD-10-CM | POA: Diagnosis not present

## 2021-11-18 DIAGNOSIS — H401131 Primary open-angle glaucoma, bilateral, mild stage: Secondary | ICD-10-CM | POA: Diagnosis not present

## 2021-11-18 DIAGNOSIS — R7309 Other abnormal glucose: Secondary | ICD-10-CM | POA: Diagnosis not present

## 2021-11-19 ENCOUNTER — Ambulatory Visit (INDEPENDENT_AMBULATORY_CARE_PROVIDER_SITE_OTHER): Payer: Medicare Other | Admitting: Internal Medicine

## 2021-11-19 ENCOUNTER — Encounter: Payer: Self-pay | Admitting: Internal Medicine

## 2021-11-19 VITALS — BP 126/66 | HR 87 | Ht 63.0 in | Wt 187.0 lb

## 2021-11-19 DIAGNOSIS — Z23 Encounter for immunization: Secondary | ICD-10-CM | POA: Diagnosis not present

## 2021-11-19 DIAGNOSIS — E785 Hyperlipidemia, unspecified: Secondary | ICD-10-CM | POA: Diagnosis not present

## 2021-11-19 DIAGNOSIS — E118 Type 2 diabetes mellitus with unspecified complications: Secondary | ICD-10-CM | POA: Diagnosis not present

## 2021-11-19 DIAGNOSIS — I10 Essential (primary) hypertension: Secondary | ICD-10-CM

## 2021-11-19 DIAGNOSIS — E1169 Type 2 diabetes mellitus with other specified complication: Secondary | ICD-10-CM | POA: Diagnosis not present

## 2021-11-19 DIAGNOSIS — S61011A Laceration without foreign body of right thumb without damage to nail, initial encounter: Secondary | ICD-10-CM | POA: Diagnosis not present

## 2021-11-19 NOTE — Progress Notes (Signed)
? ? ?Date:  11/19/2021  ? ?Name:  Dawn Fields   DOB:  [birth year]   MRN:  [id number] ? ? ?Chief Complaint: Diabetes (This morning she was 167) and Hypertension ? ?Diabetes ?She presents for her follow-up diabetic visit. She has type 2 diabetes mellitus. Her disease course has been stable. Pertinent negatives for hypoglycemia include no headaches or tremors. Pertinent negatives for diabetes include no chest pain, no fatigue, no polydipsia and no polyuria. Pertinent negatives for diabetic complications include no CVA. Current diabetic treatment includes diet. She is compliant with treatment most of the time. She is following a generally healthy diet. She rarely participates in exercise. Her home blood glucose trend is fluctuating minimally. Her breakfast blood glucose is taken between 6-7 am. Her breakfast blood glucose range is generally 140-180 mg/dl. An ACE inhibitor/angiotensin II receptor blocker is being taken. Eye exam is current.  ?Hypertension ?This is a chronic problem. The problem is controlled. Pertinent negatives include no chest pain, headaches, palpitations or shortness of breath. Past treatments include ACE inhibitors. The current treatment provides significant improvement. There is no history of kidney disease, CAD/MI or CVA.  ?Hyperlipidemia ?This is a chronic problem. The problem is uncontrolled. Pertinent negatives include no chest pain or shortness of breath. Treatments tried: added Zetia last visit.  ?Laceration - she cut her thumb at home on 4/10 with a grater.  She was able control the bleeding.  It is minimally tender.  Last Tdap is unknown. ? ?Lab Results  ?Component Value Date  ? NA 139 05/16/2021  ? K 4.6 05/16/2021  ? CO2 19 (L) 05/16/2021  ? GLUCOSE 116 (H) 05/16/2021  ? BUN 15 05/16/2021  ? CREATININE 0.86 05/16/2021  ? CALCIUM 9.9 05/16/2021  ? EGFR 70 05/16/2021  ? GFRNONAA >60 03/28/2021  ? ?Lab Results  ?Component Value Date  ? CHOL 228 (H) 05/16/2021  ? HDL 83 05/16/2021  ? LDLCALC  126 (H) 05/16/2021  ? TRIG 110 05/16/2021  ? CHOLHDL 2.7 05/16/2021  ? ?Lab Results  ?Component Value Date  ? TSH 1.500 05/16/2021  ? ?Lab Results  ?Component Value Date  ? HGBA1C 6.7 (H) 05/16/2021  ? ?Lab Results  ?Component Value Date  ? WBC 6.0 05/16/2021  ? HGB 12.1 05/16/2021  ? HCT 37.0 05/16/2021  ? MCV 89 05/16/2021  ? PLT 305 05/16/2021  ? ?Lab Results  ?Component Value Date  ? ALT 36 (H) 05/16/2021  ? AST 30 05/16/2021  ? ALKPHOS 91 05/16/2021  ? BILITOT 0.3 05/16/2021  ? ?No results found for: 25OHVITD2, [place], VD25OH  ? ?Review of Systems  ?Constitutional:  Negative for appetite change, fatigue, fever and unexpected weight change.  ?HENT:  Negative for tinnitus and trouble swallowing.   ?Eyes:  Negative for visual disturbance.  ?Respiratory:  Negative for cough, chest tightness and shortness of breath.   ?Cardiovascular:  Negative for chest pain, palpitations and leg swelling.  ?Gastrointestinal:  Negative for abdominal pain.  ?Endocrine: Negative for polydipsia and polyuria.  ?Genitourinary:  Negative for dysuria and hematuria.  ?Musculoskeletal:  Negative for arthralgias.  ?Neurological:  Negative for tremors, numbness and headaches.  ?Psychiatric/Behavioral:  Negative for dysphoric mood.   ? ?Patient Active Problem List  ? Diagnosis Date Noted  ? Lipoma of right shoulder   ? Lipoma of neck 09/26/2020  ? Lipoma of anterior chest wall 01/04/2020  ? Radial scar of left breast   ? Abnormal breast biopsy 09/01/2019  ? Myalgia due to statin 08/13/2019  ?  Cataracts, both eyes 04/12/2019  ? Carpal tunnel syndrome of right wrist 12/26/2018  ? Type II diabetes mellitus with complication ([place]) 60/60/0459  ? Knee instability, left 12/24/2017  ? History of colonic polyps 12/16/2016  ? Essential hypertension 12/11/2016  ? Hyperlipidemia associated with type 2 diabetes mellitus ([place]) 12/11/2016  ? Gastroesophageal reflux disease 12/11/2016  ? Kidney stones 12/11/2016  ? Osteopenia 12/11/2016  ? Multinodular  goiter 06/09/2016  ? ? ?Allergies  ?Allergen Reactions  ? Esomeprazole Magnesium Other (See Comments)  ?  Other Reaction: GI UPSET  ? Rosuvastatin Other (See Comments)  ?  Joint and muscle pain  ? Atorvastatin Other (See Comments)  ?  Joint pain  ? ? ?Past Surgical History:  ?Procedure Laterality Date  ? ABDOMINAL HYSTERECTOMY    ? Partial - Still have ovaries. - Cyst on uterus   ? BREAST BIOPSY Left 07/20/2019  ? sclerosing lesion  ? BREAST BIOPSY Left 09/29/2019  ? NL-neg  ? breast lesion removed Left 2021  ? BREAST LUMPECTOMY WITH NEEDLE LOCALIZATION Left 09/29/2019  ? Procedure: BREAST LUMPECTOMY WITH NEEDLE LOCALIZATION;  Surgeon: Olean Ree, MD;  Location: [facility] ORS;  Service: General;  Laterality: Left;  ? BREAST SURGERY    ? biopsy left breast  ? COLONOSCOPY  02/04/2018  ? one polyp -   ? DILATION AND CURETTAGE OF UTERUS    ? EYE SURGERY    ? cateract bilateral  ? fibroid biopsy    ? LIPOMA EXCISION Right 04/03/2021  ? Procedure: EXCISION LIPOMA, right supraclavicular & right shoulder lipomas x 2;  Surgeon: Olean Ree, MD;  Location: [facility] ORS;  Service: General;  Laterality: Right;  Right supraclavicular and right shoulder lipomas  ? TONSILLECTOMY    ? ? ?Social History  ? ?Tobacco Use  ? Smoking status: Never  ? Smokeless tobacco: Never  ?Vaping Use  ? Vaping Use: Never used  ?Substance Use Topics  ? Alcohol use: No  ? Drug use: No  ? ? ? ?Medication list has been reviewed and updated. ? ?Current Meds  ?Medication Sig  ? acetaminophen (TYLENOL) 500 MG tablet Take 2 tablets (1,000 mg total) by mouth every 6 (six) hours as needed for mild pain.  ? aspirin EC 81 MG tablet Take 81 mg by mouth daily.  ? Cholecalciferol 25 MCG (1000 UT) tablet Take 1,000 Units by mouth daily.   ? docusate sodium (COLACE) 100 MG capsule Take 100 mg by mouth 2 (two) times daily as needed.   ? ezetimibe (ZETIA) 10 MG tablet Take 1 tablet (10 mg total) by mouth daily.  ? latanoprost (XALATAN) 0.005 % ophthalmic solution Place 1  drop into both eyes at bedtime.  ? lisinopril (ZESTRIL) 10 MG tablet Take 1 tablet (10 mg total) by mouth 2 (two) times daily.  ? Omega-3 Fatty Acids (FISH OIL) 1000 MG CAPS Take 2,000 mg by mouth daily.   ? OneTouch Delica Lancets 97F MISC USE AS DIRECTED ONCE DAILY  ? ONETOUCH ULTRA test strip USE AS DIRECTED ONCE DAILY  ? ? ? ?  05/16/2021  ?  8:05 AM 01/28/2021  ?  8:19 AM 09/26/2020  ?  7:56 AM 05/13/2020  ?  8:05 AM  ?GAD 7 : Generalized Anxiety Score  ?Nervous, Anxious, on Edge 0 1 0 0  ?Control/stop worrying 0  0 0  ?Worry too much - different things 0 0 0 0  ?Trouble relaxing 0 0 0 0  ?Restless 0 0 0 0  ?Easily annoyed  or irritable 0 0 0 0  ?Afraid - awful might happen 0 0 0 0  ?Total GAD 7 Score 0  0 0  ?Anxiety Difficulty  Not difficult at all  Not difficult at all  ? ? ? ?  07/14/2021  ? 10:07 AM  ?Depression screen PHQ 2/9  ?Decreased Interest 0  ?Down, Depressed, Hopeless 0  ?PHQ - 2 Score 0  ?Altered sleeping 0  ?Tired, decreased energy 0  ?Change in appetite 0  ?Feeling bad or failure about yourself  0  ?Trouble concentrating 0  ?Moving slowly or fidgety/restless 0  ?Suicidal thoughts 0  ?PHQ-9 Score 0  ?Difficult doing work/chores Not difficult at all  ? ? ?BP Readings from Last 3 Encounters:  ?11/19/21 132/82  ?05/16/21 118/86  ?04/18/21 135/84  ? ? ?Physical Exam ?Vitals and nursing note reviewed.  ?Constitutional:   ?   General: She is not in acute distress. ?   Appearance: She is well-developed.  ?HENT:  ?   Head: Normocephalic and atraumatic.  ?Cardiovascular:  ?   Rate and Rhythm: Normal rate and regular rhythm.  ?   Pulses: Normal pulses.  ?   Heart sounds: No murmur heard. ?Pulmonary:  ?   Effort: Pulmonary effort is normal. No respiratory distress.  ?   Breath sounds: No wheezing or rhonchi.  ?Musculoskeletal:  ?   Cervical back: Normal range of motion.  ?   Right lower leg: No edema.  ?   Left lower leg: No edema.  ?Lymphadenopathy:  ?   Cervical: No cervical adenopathy.  ?Skin: ?   General:  Skin is warm and dry.  ?   Findings: Wound present. No rash.  ?   Comments: 1/2 cm circular laceration on the right first DIP - edges are closed, no erythema or drainage.  ?Neurological:  ?   General: No foc

## 2021-11-23 LAB — COMPREHENSIVE METABOLIC PANEL
ALT: 51 IU/L — ABNORMAL HIGH (ref 0–32)
AST: 42 IU/L — ABNORMAL HIGH (ref 0–40)
Albumin/Globulin Ratio: 1.5 (ref 1.2–2.2)
Albumin: 4.1 g/dL (ref 3.7–4.7)
Alkaline Phosphatase: 91 IU/L (ref 44–121)
BUN/Creatinine Ratio: 12 (ref 12–28)
BUN: 12 mg/dL (ref 8–27)
Bilirubin Total: 0.4 mg/dL (ref 0.0–1.2)
CO2: 21 mmol/L (ref 20–29)
Calcium: 9.8 mg/dL (ref 8.7–10.3)
Chloride: 104 mmol/L (ref 96–106)
Creatinine, Ser: 0.97 mg/dL (ref 0.57–1.00)
Globulin, Total: 2.8 g/dL (ref 1.5–4.5)
Glucose: 131 mg/dL — ABNORMAL HIGH (ref 70–99)
Potassium: 4.5 mmol/L (ref 3.5–5.2)
Sodium: 140 mmol/L (ref 134–144)
Total Protein: 6.9 g/dL (ref 6.0–8.5)
eGFR: 60 mL/min/{1.73_m2} (ref >59)

## 2021-11-23 LAB — MICROALBUMIN / CREATININE URINE RATIO
Creatinine, Urine: 107.4 mg/dL
Microalb/Creat Ratio: 3 mg/g creat (ref 0–29)
Microalbumin, Urine: 3 ug/mL

## 2021-11-23 LAB — LIPID PANEL
Chol/HDL Ratio: 2.4 ratio (ref 0.0–4.4)
Cholesterol, Total: 197 mg/dL (ref 100–199)
HDL: 81 mg/dL (ref >39)
LDL Chol Calc (NIH): 99 mg/dL (ref 0–99)
Triglycerides: 94 mg/dL (ref 0–149)
VLDL Cholesterol Cal: 17 mg/dL (ref 5–40)

## 2021-11-23 LAB — HEMOGLOBIN A1C
Est. average glucose Bld gHb Est-mCnc: 169 mg/dL
Hgb A1c MFr Bld: 7.5 % — ABNORMAL HIGH (ref 4.8–5.6)

## 2021-11-28 ENCOUNTER — Other Ambulatory Visit: Payer: Self-pay | Admitting: Internal Medicine

## 2021-11-28 DIAGNOSIS — I10 Essential (primary) hypertension: Secondary | ICD-10-CM

## 2021-11-28 NOTE — Telephone Encounter (Signed)
Requested Prescriptions  ?Pending Prescriptions Disp Refills  ?? lisinopril (ZESTRIL) 10 MG tablet [Pharmacy Med Name: LISINOPRIL 10 MG TABLET] 180 tablet 1  ?  Sig: TAKE 1 TABLET BY MOUTH TWICE A DAY  ?  ? Cardiovascular:  ACE Inhibitors Passed - 11/28/2021  2:29 AM  ?  ?  Passed - Cr in normal range and within 180 days  ?  Creatinine, Ser  ?Date Value Ref Range Status  ?11/19/2021 0.97 0.57 - 1.00 mg/dL Final  ?   ?  ?  Passed - K in normal range and within 180 days  ?  Potassium  ?Date Value Ref Range Status  ?11/19/2021 4.5 3.5 - 5.2 mmol/L Final  ?   ?  ?  Passed - Patient is not pregnant  ?  ?  Passed - Last BP in normal range  ?  BP Readings from Last 1 Encounters:  ?11/19/21 126/66  ?   ?  ?  Passed - Valid encounter within last 6 months  ?  Recent Outpatient Visits   ?      ? 1 week ago Essential hypertension  ? [facility] Glean Hess, MD  ? 6 months ago Annual physical exam  ? [facility] Glean Hess, MD  ? 10 months ago Type II diabetes mellitus with complication [facility])  ? [facility] Glean [facility], [facility]  ? 1 year ago Type II diabetes mellitus with complication [facility])  ? [facility] Glean Hess, MD  ? 1 year ago Annual physical exam  ? [facility] Glean Hess, MD  ?  ?  ?Future Appointments   ?        ? In 2 months Army Melia Jesse Sans, MD [facility] [place]  ? In 6 months Army Melia Jesse Sans, MD [facility] [place]  ?  ? ?  ?  ?  ? ? ?

## 2022-01-29 ENCOUNTER — Other Ambulatory Visit: Payer: Self-pay | Admitting: Internal Medicine

## 2022-01-29 NOTE — Telephone Encounter (Signed)
Requested Prescriptions  Pending Prescriptions Disp Refills  . ONETOUCH ULTRA test strip [employer] Med Name: [place] TEST STRP] 100 strip 3    Sig: USE AS DIRECTED ONCE DAILY     Endocrinology: Diabetes - Testing Supplies Passed - 01/29/2022 11:00 AM      Passed - Valid encounter within last 12 months    Recent Outpatient Visits          2 months ago Essential hypertension   [facility] Glean Hess, MD   8 months ago Annual physical exam   [facility] Glean Hess, MD   1 year ago Type II diabetes mellitus with complication [facility])   [facility] Glean Hess, MD   1 year ago Type II diabetes mellitus with complication [facility])   [facility] Glean Hess, MD   1 year ago Annual physical exam   [facility] Glean Hess, MD      Future Appointments            In 3 weeks Army Melia Jesse Sans, MD [facility] [place]   In 4 months Army Melia Jesse Sans, MD [facility], [facility]

## 2022-02-02 ENCOUNTER — Other Ambulatory Visit: Payer: Self-pay | Admitting: Internal Medicine

## 2022-02-25 ENCOUNTER — Encounter: Payer: Self-pay | Admitting: Internal Medicine

## 2022-02-25 ENCOUNTER — Ambulatory Visit (INDEPENDENT_AMBULATORY_CARE_PROVIDER_SITE_OTHER): Payer: Medicare Other | Admitting: Internal Medicine

## 2022-02-25 VITALS — BP 132/60 | HR 67 | Ht 63.0 in | Wt 180.0 lb

## 2022-02-25 DIAGNOSIS — I10 Essential (primary) hypertension: Secondary | ICD-10-CM | POA: Diagnosis not present

## 2022-02-25 DIAGNOSIS — E118 Type 2 diabetes mellitus with unspecified complications: Secondary | ICD-10-CM | POA: Diagnosis not present

## 2022-02-25 LAB — POCT GLYCOSYLATED HEMOGLOBIN (HGB A1C): Hemoglobin A1C: 6.3 % — AB (ref 4.0–5.6)

## 2022-02-25 NOTE — Progress Notes (Signed)
Date:  02/25/2022   Name:  Dawn Fields   DOB:  1946-03-05   MRN:  [id number]   Chief Complaint: Diabetes (Last BS 112 this morning )  Hypertension This is a chronic problem. The problem is controlled. Pertinent negatives include no chest pain, headaches, palpitations or shortness of breath. Past treatments include ACE inhibitors. There is no history of kidney disease, CAD/MI or CVA.  Diabetes She presents for her follow-up diabetic visit. She has type 2 diabetes mellitus. Her disease course has been improving. Pertinent negatives for hypoglycemia include no headaches or tremors. Pertinent negatives for diabetes include no chest pain, no fatigue, no polydipsia and no polyuria. Pertinent negatives for diabetic complications include no CVA. Current diabetic treatment includes diet (she had made significant diet changes since the last visit). She is following a generally healthy diet. Her home blood glucose trend is decreasing steadily. Her breakfast blood glucose is taken between 6-7 am. Her breakfast blood glucose range is generally 110-130 mg/dl. An ACE inhibitor/angiotensin II receptor blocker is being taken.    Lab Results  Component Value Date   NA 140 11/19/2021   K 4.5 11/19/2021   CO2 21 11/19/2021   GLUCOSE 131 (H) 11/19/2021   BUN 12 11/19/2021   CREATININE 0.97 11/19/2021   CALCIUM 9.8 11/19/2021   EGFR 60 11/19/2021   GFRNONAA >60 03/28/2021   Lab Results  Component Value Date   CHOL 197 11/19/2021   HDL 81 11/19/2021   LDLCALC 99 11/19/2021   TRIG 94 11/19/2021   CHOLHDL 2.4 11/19/2021   Lab Results  Component Value Date   TSH 1.500 05/16/2021   Lab Results  Component Value Date   HGBA1C 6.3 (A) 02/25/2022   Lab Results  Component Value Date   WBC 6.0 05/16/2021   HGB 12.1 05/16/2021   HCT 37.0 05/16/2021   MCV 89 05/16/2021   PLT 305 05/16/2021   Lab Results  Component Value Date   ALT 51 (H) 11/19/2021   AST 42 (H) 11/19/2021   ALKPHOS 91  11/19/2021   BILITOT 0.4 11/19/2021   No results found for: "25OHVITD2", "25OHVITD3", "VD25OH"   Review of Systems  Constitutional:  Negative for appetite change, fatigue, fever and unexpected weight change.  HENT:  Negative for tinnitus and trouble swallowing.   Eyes:  Negative for visual disturbance.  Respiratory:  Negative for cough, chest tightness and shortness of breath.   Cardiovascular:  Negative for chest pain, palpitations and leg swelling.  Gastrointestinal:  Negative for abdominal pain.  Endocrine: Negative for polydipsia and polyuria.  Genitourinary:  Negative for dysuria and hematuria.  Musculoskeletal:  Negative for arthralgias.  Neurological:  Negative for tremors, numbness and headaches.  Psychiatric/Behavioral:  Negative for dysphoric mood.     Patient Active Problem List   Diagnosis Date Noted   Lipoma of right shoulder    Lipoma of neck 09/26/2020   Lipoma of anterior chest wall 01/04/2020   Radial scar of left breast    Abnormal breast biopsy 09/01/2019   Myalgia due to statin 08/13/2019   Cataracts, both eyes 04/12/2019   Carpal tunnel syndrome of right wrist 12/26/2018   Type II diabetes mellitus with complication ([place]) 02/07/1600   Knee instability, left 12/24/2017   History of colonic polyps 12/16/2016   Essential hypertension 12/11/2016   Hyperlipidemia associated with type 2 diabetes mellitus ([place]) 12/11/2016   Gastroesophageal reflux disease 12/11/2016   Kidney stones 12/11/2016   Osteopenia 12/11/2016   Multinodular goiter 06/09/2016  Allergies  Allergen Reactions   Esomeprazole Magnesium Other (See Comments)    Other Reaction: GI UPSET   Rosuvastatin Other (See Comments)    Joint and muscle pain   Atorvastatin Other (See Comments)    Joint pain    Past Surgical History:  Procedure Laterality Date   ABDOMINAL HYSTERECTOMY     Partial - Still have ovaries. - Cyst on uterus    BREAST BIOPSY Left 07/20/2019   sclerosing lesion    BREAST BIOPSY Left 09/29/2019   NL-neg   breast lesion removed Left 2021   BREAST LUMPECTOMY WITH NEEDLE LOCALIZATION Left 09/29/2019   Procedure: BREAST LUMPECTOMY WITH NEEDLE LOCALIZATION;  Surgeon: Olean Ree, MD;  Location: [facility] ORS;  Service: General;  Laterality: Left;   BREAST SURGERY     biopsy left breast   COLONOSCOPY  02/04/2018   one polyp -    DILATION AND CURETTAGE OF UTERUS     EYE SURGERY     cateract bilateral   fibroid biopsy     LIPOMA EXCISION Right 04/03/2021   Procedure: EXCISION LIPOMA, right supraclavicular & right shoulder lipomas x 2;  Surgeon: Olean Ree, MD;  Location: [facility] ORS;  Service: General;  Laterality: Right;  Right supraclavicular and right shoulder lipomas   TONSILLECTOMY      Social History   Tobacco Use   Smoking status: Never   Smokeless tobacco: Never  Vaping Use   Vaping Use: Never used  Substance Use Topics   Alcohol use: No   Drug use: No     Medication list has been reviewed and updated.  Current Meds  Medication Sig   acetaminophen (TYLENOL) 500 MG tablet Take 2 tablets (1,000 mg total) by mouth every 6 (six) hours as needed for mild pain.   aspirin EC 81 MG tablet Take 81 mg by mouth daily.   Cholecalciferol 25 MCG (1000 UT) tablet Take 1,000 Units by mouth daily.    docusate sodium (COLACE) 100 MG capsule Take 100 mg by mouth 2 (two) times daily as needed.    ezetimibe (ZETIA) 10 MG tablet Take 1 tablet (10 mg total) by mouth daily.   Lancets (ONETOUCH DELICA PLUS [id number]) MISC USE AS DIRECTED ONCE DAILY   latanoprost (XALATAN) 0.005 % ophthalmic solution Place 1 drop into both eyes at bedtime.   lisinopril (ZESTRIL) 10 MG tablet TAKE 1 TABLET BY MOUTH TWICE A DAY   Omega-3 Fatty Acids (FISH OIL) 1000 MG CAPS Take 2,000 mg by mouth daily.    ONETOUCH ULTRA test strip USE AS DIRECTED ONCE DAILY       02/25/2022   11:21 AM 11/19/2021    8:02 AM 05/16/2021    8:05 AM 01/28/2021    8:19 AM  GAD 7 : Generalized  Anxiety Score  Nervous, Anxious, on Edge 0 0 0 1  Control/stop worrying 0 0 0   Worry too much - different things 0 0 0 0  Trouble relaxing 0 0 0 0  Restless 0 0 0 0  Easily annoyed or irritable 0 0 0 0  Afraid - awful might happen 0 0 0 0  Total GAD 7 Score 0 0 0   Anxiety Difficulty Not difficult at all   Not difficult at all       02/25/2022   11:21 AM 11/19/2021    8:02 AM 07/14/2021   10:07 AM  Depression screen PHQ 2/9  Decreased Interest 1 0 0  Down, Depressed, Hopeless 0 0 0  PHQ - 2 Score 1 0 0  Altered sleeping 1 1 0  Tired, decreased energy 0 0 0  Change in appetite 0 0 0  Feeling bad or failure about yourself  0 0 0  Trouble concentrating 0 0 0  Moving slowly or fidgety/restless 0 0 0  Suicidal thoughts 0 0 0  PHQ-9 Score 2 1 0  Difficult doing work/chores Not difficult at all Not difficult at all Not difficult at all    BP Readings from Last 3 Encounters:  02/25/22 132/60  11/19/21 126/66  05/16/21 118/86    Physical Exam Vitals and nursing note reviewed.  Constitutional:      General: She is not in acute distress.    Appearance: She is well-developed.  HENT:     Head: Normocephalic and atraumatic.  Cardiovascular:     Rate and Rhythm: Normal rate and regular rhythm.     Pulses: Normal pulses.  Pulmonary:     Effort: Pulmonary effort is normal. No respiratory distress.     Breath sounds: No wheezing or rhonchi.  Musculoskeletal:     Cervical back: Normal range of motion.     Right lower leg: No edema.     Left lower leg: No edema.  Skin:    General: Skin is warm and dry.     Findings: No rash.  Neurological:     Mental Status: She is alert and oriented to person, place, and time.  Psychiatric:        Mood and Affect: Mood normal.        Behavior: Behavior normal.     Wt Readings from Last 3 Encounters:  02/25/22 180 lb (81.6 kg)  11/19/21 187 lb (84.8 kg)  05/16/21 182 lb (82.6 kg)    BP 132/60 (BP Location: Right Arm, Cuff Size:  Normal)   Pulse 67   Ht _0  (1.6 m)   Wt 180 lb (81.6 kg)   SpO2 97%   BMI 31.89 kg/m   Assessment and Plan: 1. Type II diabetes mellitus with complication (HCC) Clinically stable by exam and report without s/s of hypoglycemia. DM complicated by hypertension and dyslipidemia. Excellent compliance with dietary changes and weight loss ( 7 lbs) Eye exam is scheduled - POCT HgB A1C = 6.3 down from 7.5  2. Essential hypertension Clinically stable exam with well controlled BP. Tolerating medications without side effects at this time. Pt to continue current regimen and low sodium diet; benefits of regular exercise as able discussed.   Partially dictated using Editor, commissioning. Any errors are unintentional.  Halina Maidens, MD [facility]  02/25/2022

## 2022-03-10 ENCOUNTER — Ambulatory Visit: Payer: Self-pay | Admitting: *Deleted

## 2022-03-10 ENCOUNTER — Encounter: Payer: Self-pay | Admitting: Family Medicine

## 2022-03-10 ENCOUNTER — Ambulatory Visit (INDEPENDENT_AMBULATORY_CARE_PROVIDER_SITE_OTHER): Payer: Medicare Other | Admitting: Family Medicine

## 2022-03-10 VITALS — BP 130/82 | HR 78 | Temp 100.2°F | Ht 63.0 in | Wt 177.2 lb

## 2022-03-10 DIAGNOSIS — U071 COVID-19: Secondary | ICD-10-CM | POA: Diagnosis not present

## 2022-03-10 LAB — POC COVID19 BINAXNOW: SARS Coronavirus 2 Ag: POSITIVE — AB

## 2022-03-10 MED ORDER — MOLNUPIRAVIR EUA 200MG CAPSULE: 4.0000 | ORAL_CAPSULE | Freq: Two times a day (BID) | ORAL | 0 refills | Status: AC

## 2022-03-10 MED ORDER — PROMETHAZINE-DM 6.25-15 MG/5ML PO SYRP: 5.0000 mL | ORAL_SOLUTION | Freq: Four times a day (QID) | ORAL | 0 refills | Status: DC | PRN

## 2022-03-10 NOTE — Telephone Encounter (Signed)
  Chief Complaint: bilateral earache and productive cough Symptoms: coughing up yellow-green mucus and having bad pain in both ears Frequency: Started Sat.   Returned from a cruise. Pertinent Negatives: Patient denies fever, or nasal congestion but does have mild sinus pressure. Disposition: '[]'$ ED /'[]'$ Urgent Care (no appt availability in office) / '[x]'$ Appointment(In office/virtual)/ '[]'$  [place] [facility]/ '[]'$ Home Care/ '[]'$ Refused Recommended Disposition /'[]'$ [facility]/ '[]'$  Follow-up with PCP Additional Notes: Spoke with Levada Dy at [facility] and a same day appt. Was made with Dr. Rosette Reveal for today at 1:20.  Dr. Army Melia is out of the office this week.

## 2022-03-10 NOTE — Assessment & Plan Note (Signed)
5-day history of scratchy throat, ear pain, denies fevers or chills, no shortness of air, productive cough of greenish sputum.  She recently came back from a cruise.  Examination significant for erythematous turbinates, fluid left tympanic membrane without erythema, benign oropharynx, shotty lymphadenopathy anterior cervical chain, right with mild tenderness, clear lung fields throughout and benign cardiac findings.  She was found to be COVID-positive through point-of-care nasal swab, she has had her vaccine series and boosters.  Given comorbid history, plan for molnupiravir, Rx antitussive, supportive care options, and for her to contact our office for suboptimal progress into next week.  Appropriate isolation guidelines reviewed.

## 2022-03-10 NOTE — Patient Instructions (Signed)
-   Take molnupiravir for full course - Dose cough medicine on an as-needed basis - Recommend Flonase, 2 sprays in each nostril and Mucinex twice daily for additional symptom control - Stay adequately hydrated with water throughout the day and get plenty of rest - Can consider supplementation with vitamin D, vitamin C, and zinc - Contact our office early next week for any lack of adequate progress or worsened symptoms

## 2022-03-10 NOTE — Progress Notes (Signed)
     Primary Care / Sports Medicine Office Visit  Patient Information:  Patient ID: Dawn Fields, female DOB: [birth year] Age: 76 y.o. MRN: [id number]   Dawn Fields is a pleasant 76 y.o. female presenting with the following:  Chief Complaint  Patient presents with   Otalgia    Both ears, scratchy throat since Thursday.     Vitals:   03/10/22 1325  BP: 130/82  Pulse: 78  Temp: 100.2 F (37.9 C)  SpO2: 97%   Vitals:   03/10/22 1325  Weight: 177 lb 3.2 oz (80.4 kg)  Height: '5\' 3"'$  (1.6 m)   Body mass index is 31.39 kg/m.  No results found.   Independent interpretation of notes and tests performed by another provider:   None  Procedures performed:   None  Pertinent History, Exam, Impression, and Recommendations:   Problem List Items Addressed This Visit       Other   COVID-19 - Primary    5-day history of scratchy throat, ear pain, denies fevers or chills, no shortness of air, productive cough of greenish sputum.  She recently came back from a cruise.  Examination significant for erythematous turbinates, fluid left tympanic membrane without erythema, benign oropharynx, shotty lymphadenopathy anterior cervical chain, right with mild tenderness, clear lung fields throughout and benign cardiac findings.  She was found to be COVID-positive through point-of-care nasal swab, she has had her vaccine series and boosters.  Given comorbid history, plan for molnupiravir, Rx antitussive, supportive care options, and for her to contact our office for suboptimal progress into next week.  Appropriate isolation guidelines reviewed.      Relevant Medications   molnupiravir EUA (LAGEVRIO) 200 mg CAPS capsule   promethazine-dextromethorphan (PROMETHAZINE-DM) 6.25-15 MG/5ML syrup   Other Relevant Orders   POC COVID-19 (Completed)     Orders & Medications Meds ordered this encounter  Medications   molnupiravir EUA (LAGEVRIO) 200 mg CAPS capsule    Sig: Take 4 capsules (800 mg  total) by mouth 2 (two) times daily for 5 days.    Dispense:  40 capsule    Refill:  0   promethazine-dextromethorphan (PROMETHAZINE-DM) 6.25-15 MG/5ML syrup    Sig: Take 5 mLs by mouth 4 (four) times daily as needed for cough.    Dispense:  118 mL    Refill:  0   Orders Placed This Encounter  Procedures   POC COVID-19     No follow-ups on file.     Montel Culver, MD   Primary Care Sports Medicine [facility]

## 2022-03-10 NOTE — Telephone Encounter (Signed)
Message from [place] sent at 03/10/2022  8:05 AM EDT  Summary: possible sinus infection earache, bad cold congestion/sharp pain in both ears   Pt niece not with pt stated pt is coming back from a cruise possible sinus infection earache, bad cold congestion.  Has sharp pain on both ears and a cough.   Pt niece also mentioned pt son is with her and can take her if we can get her in today.   Pt seeking clinical advice.  No appointments available.   Please call pt back directly - [phone] [facility] Phone)           Call History   Type Contact Phone/Fax User  03/10/2022 08:00 AM EDT Phone (Incoming) Parker,Robin (Emergency Contact) [phone] McGill, Nelva Bush   Reason for Disposition  Earache  Answer Assessment - Initial Assessment Questions 1. ONSET: "When did the cough begin?"      Coughing up mucus.   Started last Sat.  Yellow-green mucus 2. SEVERITY: "How bad is the cough today?"      Getting better. 3. SPUTUM: "Describe the color of your sputum" (none, dry cough; clear, white, yellow, green)     Yellow green 4. HEMOPTYSIS: "Are you coughing up any blood?" If so ask: "How much?" (flecks, streaks, tablespoons, etc.)     Not asked 5. DIFFICULTY BREATHING: "Are you having difficulty breathing?" If Yes, ask: "How bad is it?" (e.g., mild, moderate, severe)    - MILD: No SOB at rest, mild SOB with walking, speaks normally in sentences, can lie down, no retractions, pulse < 100.    - MODERATE: SOB at rest, SOB with minimal exertion and prefers to sit, cannot lie down flat, speaks in phrases, mild retractions, audible wheezing, pulse 100-120.    - SEVERE: Very SOB at rest, speaks in single words, struggling to breathe, sitting hunched forward, retractions, pulse > 120      No 6. FEVER: "Do you have a fever?" If Yes, ask: "What is your temperature, how was it measured, and when did it start?"     No 7. CARDIAC HISTORY: "Do you have any history of heart disease?" (e.g., heart  attack, congestive heart failure)      Not asked 8. LUNG HISTORY: "Do you have any history of lung disease?"  (e.g., pulmonary embolus, asthma, emphysema)     No history 9. PE RISK FACTORS: "Do you have a history of blood clots?" (or: recent major surgery, recent prolonged travel, bedridden)     Not asked 10. OTHER SYMPTOMS: "Do you have any other symptoms?" (e.g., runny nose, wheezing, chest pain)       Earache started first.   I was on a cruise when it started.   The pain is terrible in both ears.   Scratchy throat that but it's better now.   Everything is in the back of my throat.  Post nasal drainage. 11. PREGNANCY: "Is there any chance you are pregnant?" "When was your last menstrual period?"       N/A 12. TRAVEL: "Have you traveled out of the country in the last month?" (e.g., travel history, exposures)       N/A  Protocols used: Cough - Acute Productive-A-AH

## 2022-03-30 ENCOUNTER — Encounter: Payer: Self-pay | Admitting: Internal Medicine

## 2022-03-30 DIAGNOSIS — H26493 Other secondary cataract, bilateral: Secondary | ICD-10-CM | POA: Diagnosis not present

## 2022-03-30 DIAGNOSIS — R7309 Other abnormal glucose: Secondary | ICD-10-CM | POA: Diagnosis not present

## 2022-03-30 DIAGNOSIS — Z961 Presence of intraocular lens: Secondary | ICD-10-CM | POA: Diagnosis not present

## 2022-03-30 DIAGNOSIS — H40053 Ocular hypertension, bilateral: Secondary | ICD-10-CM | POA: Diagnosis not present

## 2022-03-30 DIAGNOSIS — Z7984 Long term (current) use of oral hypoglycemic drugs: Secondary | ICD-10-CM | POA: Diagnosis not present

## 2022-03-30 DIAGNOSIS — H401131 Primary open-angle glaucoma, bilateral, mild stage: Secondary | ICD-10-CM | POA: Diagnosis not present

## 2022-03-30 LAB — HM DIABETES EYE EXAM

## 2022-05-07 ENCOUNTER — Other Ambulatory Visit: Payer: Self-pay | Admitting: Internal Medicine

## 2022-05-07 DIAGNOSIS — E1169 Type 2 diabetes mellitus with other specified complication: Secondary | ICD-10-CM

## 2022-05-07 NOTE — Telephone Encounter (Signed)
Requested Prescriptions  Pending Prescriptions Disp Refills  . ezetimibe (ZETIA) 10 MG tablet [Pharmacy Med Name: EZETIMIBE 10 MG TABLET] 90 tablet 0    Sig: TAKE 1 TABLET BY MOUTH EVERY DAY     Cardiovascular:  Antilipid - Sterol Transport Inhibitors Failed - 05/07/2022  2:24 AM      Failed - AST in normal range and within 360 days    AST  Date Value Ref Range Status  11/19/2021 42 (H) 0 - 40 IU/L Final         Failed - ALT in normal range and within 360 days    ALT  Date Value Ref Range Status  11/19/2021 51 (H) 0 - 32 IU/L Final         Failed - Lipid Panel in normal range within the last 12 months    Cholesterol, Total  Date Value Ref Range Status  11/19/2021 197 100 - 199 mg/dL Final   LDL Chol Calc (NIH)  Date Value Ref Range Status  11/19/2021 99 0 - 99 mg/dL Final   HDL  Date Value Ref Range Status  11/19/2021 81 >39 mg/dL Final   Triglycerides  Date Value Ref Range Status  11/19/2021 94 0 - 149 mg/dL Final         Passed - Patient is not pregnant      Passed - Valid encounter within last 12 months    Recent Outpatient Visits          1 month ago [place] [facility] at [place], Earley Abide, MD   2 months ago Type II diabetes mellitus with complication [facility])   [place] [facility] at [facility], Jesse Sans, MD   5 months ago Essential hypertension   [place] [facility] at [facility], Jesse Sans, MD   11 months ago Annual physical exam   [place] [facility] at [facility], Jesse Sans, MD   1 year ago Type II diabetes mellitus with complication [facility])   [place] [facility] at [facility], Jesse Sans, MD      Future Appointments            In 3 weeks Army Melia Jesse Sans, MD [facility] Primary Care and Sports Medicine at [facility], [facility]

## 2022-05-08 ENCOUNTER — Other Ambulatory Visit: Payer: Self-pay | Admitting: Internal Medicine

## 2022-05-08 DIAGNOSIS — I10 Essential (primary) hypertension: Secondary | ICD-10-CM

## 2022-05-08 NOTE — Telephone Encounter (Signed)
Rx 11/28/21 #180 1RF- 6 month supply- too soon Requested Prescriptions  Pending Prescriptions Disp Refills  . lisinopril (ZESTRIL) 10 MG tablet [Pharmacy Med Name: LISINOPRIL 10 MG TABLET] 180 tablet 1    Sig: TAKE 1 TABLET BY MOUTH TWICE A DAY     Cardiovascular:  ACE Inhibitors Passed - 05/08/2022  8:02 AM      Passed - Cr in normal range and within 180 days    Creatinine, Ser  Date Value Ref Range Status  11/19/2021 0.97 0.57 - 1.00 mg/dL Final         Passed - K in normal range and within 180 days    Potassium  Date Value Ref Range Status  11/19/2021 4.5 3.5 - 5.2 mmol/L Final         Passed - Patient is not pregnant      Passed - Last BP in normal range    BP Readings from Last 1 Encounters:  03/10/22 130/82         Passed - Valid encounter within last 6 months    Recent Outpatient Visits          1 month ago [place] [facility] at [place], Earley Abide, MD   2 months ago Type II diabetes mellitus with complication [facility])   [place] [facility] at [facility], Jesse Sans, MD   5 months ago Essential hypertension   [place] [facility] Medicine at [facility], Jesse Sans, MD   11 months ago Annual physical exam   [place] [facility] Medicine at [facility], Jesse Sans, MD   1 year ago Type II diabetes mellitus with complication [facility])   [place] [facility] at [facility], Jesse Sans, MD      Future Appointments            In 3 weeks Army Melia Jesse Sans, MD [facility] Primary Care and Sports Medicine at [facility], [facility]

## 2022-05-12 ENCOUNTER — Other Ambulatory Visit: Payer: Self-pay | Admitting: Internal Medicine

## 2022-05-12 DIAGNOSIS — E1169 Type 2 diabetes mellitus with other specified complication: Secondary | ICD-10-CM

## 2022-05-12 DIAGNOSIS — Z1231 Encounter for screening mammogram for malignant neoplasm of breast: Secondary | ICD-10-CM

## 2022-05-13 NOTE — Telephone Encounter (Signed)
last RF 05/07/22 #90   Requested Prescriptions  Refused Prescriptions Disp Refills  . ezetimibe (ZETIA) 10 MG tablet [Pharmacy Med Name: EZETIMIBE 10 MG TABLET] 90 tablet 0    Sig: TAKE 1 TABLET BY MOUTH EVERY DAY     Cardiovascular:  Antilipid - Sterol Transport Inhibitors Failed - 05/12/2022  6:50 PM      Failed - AST in normal range and within 360 days    AST  Date Value Ref Range Status  11/19/2021 42 (H) 0 - 40 IU/L Final         Failed - ALT in normal range and within 360 days    ALT  Date Value Ref Range Status  11/19/2021 51 (H) 0 - 32 IU/L Final         Failed - Lipid Panel in normal range within the last 12 months    Cholesterol, Total  Date Value Ref Range Status  11/19/2021 197 100 - 199 mg/dL Final   LDL Chol Calc (NIH)  Date Value Ref Range Status  11/19/2021 99 0 - 99 mg/dL Final   HDL  Date Value Ref Range Status  11/19/2021 81 >39 mg/dL Final   Triglycerides  Date Value Ref Range Status  11/19/2021 94 0 - 149 mg/dL Final         Passed - Patient is not pregnant      Passed - Valid encounter within last 12 months    Recent Outpatient Visits          2 months ago [place] [facility] at [place], Earley Abide, MD   2 months ago Type II diabetes mellitus with complication [facility])   [place] [facility] at [facility], Jesse Sans, MD   5 months ago Essential hypertension   [place] [facility] Medicine at [facility], Jesse Sans, MD   12 months ago Annual physical exam   [place] [facility] Medicine at [facility], Jesse Sans, MD   1 year ago Type II diabetes mellitus with complication [facility])   [place] [facility] and [facility] at [facility], Jesse Sans, MD      Future Appointments            In 2 weeks Army Melia, Jesse Sans, MD [facility] Primary Care and Sports Medicine at [facility], [facility]

## 2022-06-01 ENCOUNTER — Ambulatory Visit (INDEPENDENT_AMBULATORY_CARE_PROVIDER_SITE_OTHER): Payer: Medicare Other | Admitting: Internal Medicine

## 2022-06-01 ENCOUNTER — Encounter: Payer: Self-pay | Admitting: Internal Medicine

## 2022-06-01 VITALS — BP 128/70 | HR 74 | Ht 63.0 in | Wt 177.6 lb

## 2022-06-01 DIAGNOSIS — E785 Hyperlipidemia, unspecified: Secondary | ICD-10-CM

## 2022-06-01 DIAGNOSIS — G720 Drug-induced myopathy: Secondary | ICD-10-CM

## 2022-06-01 DIAGNOSIS — E118 Type 2 diabetes mellitus with unspecified complications: Secondary | ICD-10-CM

## 2022-06-01 DIAGNOSIS — Z1231 Encounter for screening mammogram for malignant neoplasm of breast: Secondary | ICD-10-CM

## 2022-06-01 DIAGNOSIS — Z23 Encounter for immunization: Secondary | ICD-10-CM | POA: Diagnosis not present

## 2022-06-01 DIAGNOSIS — E042 Nontoxic multinodular goiter: Secondary | ICD-10-CM

## 2022-06-01 DIAGNOSIS — E1169 Type 2 diabetes mellitus with other specified complication: Secondary | ICD-10-CM | POA: Diagnosis not present

## 2022-06-01 DIAGNOSIS — Z0000 Encounter for general adult medical examination without abnormal findings: Secondary | ICD-10-CM | POA: Diagnosis not present

## 2022-06-01 DIAGNOSIS — I10 Essential (primary) hypertension: Secondary | ICD-10-CM | POA: Diagnosis not present

## 2022-06-01 LAB — POCT URINALYSIS DIPSTICK
Bilirubin, UA: NEGATIVE
Blood, UA: NEGATIVE
Glucose, UA: NEGATIVE
Ketones, UA: NEGATIVE
Leukocytes, UA: NEGATIVE
Nitrite, UA: NEGATIVE
Protein, UA: NEGATIVE
Spec Grav, UA: 1.025 (ref 1.010–1.025)
Urobilinogen, UA: 0.2 E.U./dL
pH, UA: 5 (ref 5.0–8.0)
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
## 2022-06-02 ENCOUNTER — Telehealth: Payer: Self-pay

## 2022-06-02 LAB — TSH+FREE T4
Free T4: 1.14 ng/dL (ref 0.82–1.77)
TSH: 1.42 u[IU]/mL (ref 0.450–4.500)

## 2022-06-02 LAB — CBC WITH DIFFERENTIAL/PLATELET
Basophils Absolute: 0.1 10*3/uL (ref 0.0–0.2)
Basos: 1 %
EOS (ABSOLUTE): 0.2 10*3/uL (ref 0.0–0.4)
Eos: 2 %
Hematocrit: 34.8 % (ref 34.0–46.6)
Hemoglobin: 11.5 g/dL (ref 11.1–15.9)
Immature Grans (Abs): 0 10*3/uL (ref 0.0–0.1)
Immature Granulocytes: 0 %
Lymphocytes Absolute: 2.7 10*3/uL (ref 0.7–3.1)
Lymphs: 45 %
MCH: 29 pg (ref 26.6–33.0)
MCHC: 33 g/dL (ref 31.5–35.7)
MCV: 88 fL (ref 79–97)
Monocytes Absolute: 0.6 10*3/uL (ref 0.1–0.9)
Monocytes: 9 %
Neutrophils Absolute: 2.7 10*3/uL (ref 1.4–7.0)
Neutrophils: 43 %
Platelets: 335 10*3/uL (ref 150–450)
RBC: 3.97 x10E6/uL (ref 3.77–5.28)
RDW: 12.6 % (ref 11.7–15.4)
WBC: 6.2 10*3/uL (ref 3.4–10.8)

## 2022-06-02 LAB — LIPID PANEL
Chol/HDL Ratio: 2.3 ratio (ref 0.0–4.4)
Cholesterol, Total: 180 mg/dL (ref 100–199)
HDL: 79 mg/dL (ref >39)
LDL Chol Calc (NIH): 87 mg/dL (ref 0–99)
Triglycerides: 78 mg/dL (ref 0–149)
VLDL Cholesterol Cal: 14 mg/dL (ref 5–40)

## 2022-06-02 LAB — COMPREHENSIVE METABOLIC PANEL
ALT: 29 IU/L (ref 0–32)
AST: 26 IU/L (ref 0–40)
Albumin/Globulin Ratio: 1.5 (ref 1.2–2.2)
Albumin: 4.1 g/dL (ref 3.8–4.8)
Alkaline Phosphatase: 84 IU/L (ref 44–121)
BUN/Creatinine Ratio: 15 (ref 12–28)
BUN: 15 mg/dL (ref 8–27)
Bilirubin Total: 0.3 mg/dL (ref 0.0–1.2)
CO2: 22 mmol/L (ref 20–29)
Calcium: 10 mg/dL (ref 8.7–10.3)
Chloride: 102 mmol/L (ref 96–106)
Creatinine, Ser: 0.99 mg/dL (ref 0.57–1.00)
Globulin, Total: 2.7 g/dL (ref 1.5–4.5)
Glucose: 104 mg/dL — ABNORMAL HIGH (ref 70–99)
Potassium: 4.3 mmol/L (ref 3.5–5.2)
Sodium: 140 mmol/L (ref 134–144)
Total Protein: 6.8 g/dL (ref 6.0–8.5)
eGFR: 59 mL/min/{1.73_m2} — ABNORMAL LOW (ref >59)

## 2022-06-02 LAB — HEMOGLOBIN A1C
Est. average glucose Bld gHb Est-mCnc: 140 mg/dL
Hgb A1c MFr Bld: 6.5 % — ABNORMAL HIGH (ref 4.8–5.6)

## 2022-06-02 NOTE — Telephone Encounter (Signed)
Pt called for lab results. Shared provider's note  Glean Hess, MD  06/02/2022  1:43 PM EDT     DM is stable, cholesterol is good.  Other labs are normal.    No questions at this time.

## 2022-06-05 ENCOUNTER — Other Ambulatory Visit: Payer: Self-pay | Admitting: Internal Medicine

## 2022-06-05 DIAGNOSIS — I10 Essential (primary) hypertension: Secondary | ICD-10-CM

## 2022-06-05 NOTE — Telephone Encounter (Signed)
Requested Prescriptions  Pending Prescriptions Disp Refills  . lisinopril (ZESTRIL) 10 MG tablet [Pharmacy Med Name: LISINOPRIL 10 MG TABLET] 180 tablet 1    Sig: TAKE 1 TABLET BY MOUTH TWICE A DAY     Cardiovascular:  ACE Inhibitors Passed - 06/05/2022  1:26 AM      Passed - Cr in normal range and within 180 days    Creatinine, Ser  Date Value Ref Range Status  06/01/2022 0.99 0.57 - 1.00 mg/dL Final         Passed - K in normal range and within 180 days    Potassium  Date Value Ref Range Status  06/01/2022 4.3 3.5 - 5.2 mmol/L Final         Passed - Patient is not pregnant      Passed - Last BP in normal range    BP Readings from Last 1 Encounters:  06/01/22 128/70         Passed - Valid encounter within last 6 months    Recent Outpatient Visits          4 days ago Annual physical exam   [place] [facility] at [facility], Jesse Sans, MD   2 months ago [place] [facility] at [place], Earley Abide, MD   3 months ago Type II diabetes mellitus with complication [facility])   [place] [facility] at [facility], Jesse Sans, MD   6 months ago Essential hypertension    Primary Care and Sports Medicine at [facility] Jesse Sans, MD   1 year ago Annual physical exam   [facility] at [facility], Jesse Sans, MD      Future Appointments            In 3 months Army Melia, Jesse Sans, MD [facility] Primary Care [facility] at [facility], [facility]   In 12 months Army Melia, Jesse Sans, MD [place] [facility] Medicine at [facility], [facility]

## 2022-06-10 ENCOUNTER — Ambulatory Visit (INDEPENDENT_AMBULATORY_CARE_PROVIDER_SITE_OTHER): Payer: Medicare Other

## 2022-06-10 DIAGNOSIS — Z23 Encounter for immunization: Secondary | ICD-10-CM | POA: Diagnosis not present

## 2022-06-22 ENCOUNTER — Ambulatory Visit
Admission: RE | Admit: 2022-06-22 | Discharge: 2022-06-22 | Disposition: A | Discharge status: Home / Self Care | Payer: Medicare Other | Source: Ambulatory Visit | Attending: Internal Medicine | Admitting: Internal Medicine

## 2022-06-22 DIAGNOSIS — Z1231 Encounter for screening mammogram for malignant neoplasm of breast: Secondary | ICD-10-CM | POA: Diagnosis not present

## 2022-07-15 ENCOUNTER — Ambulatory Visit (INDEPENDENT_AMBULATORY_CARE_PROVIDER_SITE_OTHER): Payer: Medicare Other

## 2022-07-15 VITALS — Ht 63.0 in | Wt 177.6 lb

## 2022-07-15 DIAGNOSIS — Z0000 Encounter for general adult medical examination without abnormal findings: Secondary | ICD-10-CM

## 2022-07-15 NOTE — Progress Notes (Signed)
Subjective:   Dawn Fields is a 76 y.o. female who presents for Medicare Annual (Subsequent) preventive examination.  I connected with  Evern Bio on 07/15/22 by a audio enabled telemedicine application and verified that I am speaking with the correct person using two identifiers.  Patient Location: Home  Provider Location: Office/Clinic  I discussed the limitations of evaluation and management by telemedicine. The patient expressed understanding and agreed to proceed.   Review of Systems    Defer to PCP Cardiac Risk Factors include: advanced age (>66mn, >>46women)     Objective:    Today's Vitals   07/15/22 0819 07/15/22 0923  Weight: 177 lb 9.6 oz (80.6 kg)   Height: '5\' 3"'$  (1.6 m)   PainSc:  0-No pain   Body mass index is 31.46 kg/m.     07/15/2022    9:25 AM 07/14/2021   10:09 AM 04/03/2021   10:29 AM 03/27/2021    8:34 AM 07/10/2020    8:56 AM 09/19/2019   12:36 PM 07/10/2019    9:00 AM  Advanced Directives  Does Patient Have a Medical Advance Directive? Yes Yes Yes Yes Yes Yes Yes  Type of Advance Directive Living will [place]  [place] will [place]Living will  Does patient want to make changes to medical advance directive?   No - Patient declined   No - Patient declined No - Patient declined  Copy of [place] Chart?  No - copy requested No - copy requested  No - copy requested No - copy requested No - copy requested    Current Medications (verified) Outpatient Encounter Medications as of 07/15/2022  Medication Sig   acetaminophen (TYLENOL) 500 MG tablet Take 2 tablets (1,000 mg total) by mouth every 6 (six) hours as needed for mild pain.   aspirin EC 81 MG tablet Take 81 mg by mouth daily.   Cholecalciferol 25 MCG (1000 UT) tablet Take 1,000 Units by mouth daily.    docusate  sodium (COLACE) 100 MG capsule Take 100 mg by mouth 2 (two) times daily as needed.    ezetimibe (ZETIA) 10 MG tablet TAKE 1 TABLET BY MOUTH EVERY DAY   Lancets (ONETOUCH DELICA PLUS [id number] MISC USE AS DIRECTED ONCE DAILY   latanoprost (XALATAN) 0.005 % ophthalmic solution Place 1 drop into both eyes at bedtime.   lisinopril (ZESTRIL) 10 MG tablet TAKE 1 TABLET BY MOUTH TWICE A DAY   Omega-3 Fatty Acids (FISH OIL) 1000 MG CAPS Take 2,000 mg by mouth daily.    ONETOUCH ULTRA test strip USE AS DIRECTED ONCE DAILY   zinc gluconate 50 MG tablet Take 50 mg by mouth daily.   No facility-administered encounter medications on file as of 07/15/2022.    Allergies (verified) Esomeprazole magnesium, Rosuvastatin, and Atorvastatin   History: Past Medical History:  Diagnosis Date   Allergy Pollen   Carpal tunnel syndrome of right wrist    Cataract Surgery Pending   GERD (gastroesophageal reflux disease)    History of kidney stones    Hyperlipidemia    Hypertension    Kidney stones    Multinodular goiter    Type II diabetes mellitus with complication ([facility]    Past Surgical History:  Procedure Laterality Date   ABDOMINAL HYSTERECTOMY     Partial - Still have ovaries. - Cyst on uterus    BREAST BIOPSY Left  07/20/2019   sclerosing lesion   BREAST BIOPSY Left 09/29/2019   NL-neg   breast lesion removed Left 2021   BREAST LUMPECTOMY WITH NEEDLE LOCALIZATION Left 09/29/2019   Procedure: BREAST LUMPECTOMY WITH NEEDLE LOCALIZATION;  Surgeon: Olean Ree, MD;  Location: [facility] ORS;  Service: General;  Laterality: Left;   BREAST SURGERY     biopsy left breast   COLONOSCOPY  02/04/2018   one polyp -    DILATION AND CURETTAGE OF UTERUS     EYE SURGERY     cateract bilateral   fibroid biopsy     LIPOMA EXCISION Right 04/03/2021   Procedure: EXCISION LIPOMA, right supraclavicular & right shoulder lipomas x 2;  Surgeon: Olean Ree, MD;  Location: [facility] ORS;  Service: General;  Laterality:  Right;  Right supraclavicular and right shoulder lipomas   TONSILLECTOMY     Family History  Problem Relation Age of Onset   Stroke Mother    Hypertension Mother    Liver cancer Father    Cancer Father    COPD Sister    Diabetes Sister    Stroke Sister    Diabetes Brother    Stroke Brother    Hearing loss Brother    Diabetes Maternal Grandmother    Diabetes Maternal Grandfather    Diabetes Sister    Mental illness Sister    Breast cancer Maternal Aunt    Breast cancer Cousin        mat cousin   Social History   Socioeconomic History   Marital status: Single    Spouse name: Not on file   Number of children: 1   Years of education: Not on file   Highest education level: Associate degree: academic program  Occupational History   Occupation: retired    Comment: Theatre manager  Tobacco Use   Smoking status: Never   Smokeless tobacco: Never  Vaping Use   Vaping Use: Never used  Substance and Sexual Activity   Alcohol use: No   Drug use: No   Sexual activity: Not Currently    Birth control/protection: Other-see comments    Comment: Age  Other Topics Concern   Not on file  Social History Narrative   Lives alone   Social Determinants of Health   Financial Resource Strain: Low Risk  (07/15/2022)   Overall Financial Resource Strain (CARDIA)    Difficulty of Paying Living Expenses: Not hard at all  Food Insecurity: No Food Insecurity (07/15/2022)   Hunger Vital Sign    Worried About Estate manager/land agent of Food in the Last Year: Never true    Ran Out of Food in the Last Year: Never true  Transportation Needs: No Transportation Needs (07/15/2022)   PRAPARE - Hydrologist (Medical): No    Lack of Transportation (Non-Medical): No  Physical Activity: Unknown (07/15/2022)   Exercise Vital Sign    Days of Exercise per Week: 4 days    Minutes of Exercise per Session: Not on file  Stress: No Stress Concern Present (07/15/2022)   [place]    Feeling of Stress : Not at all  Social Connections: Socially Isolated (07/15/2022)   Social Connection and Isolation Panel [NHANES]    Frequency of Communication with Friends and Family: More than three times a week    Frequency of Social Gatherings with Friends and Family: Once a week    Attends Religious Services: Never    Active Member  of Clubs or Organizations: No    Attends Archivist Meetings: Never    Marital Status: Never married    Tobacco Counseling Counseling given: Not Answered   Clinical Intake:  Pre-visit preparation completed: Yes  Pain : No/denies pain Pain Score: 0-No pain     BMI - recorded: 31.46 Nutritional Status: BMI > 30  Obese Nutritional Risks: None Diabetes: Yes     Diabetic? Yes.     Information entered by :: Wyatt Haste, CMA   Activities of Daily Living    07/15/2022    9:26 AM 11/19/2021    8:02 AM  In your present state of health, do you have any difficulty performing the following activities:  Hearing? 0 0  Vision? 0 0  Difficulty concentrating or making decisions? 0 0  Walking or climbing stairs? 0 0  Dressing or bathing? 0 0  Doing errands, shopping? 0 0  Preparing Food and eating ? N   Using the Toilet? N   In the past six months, have you accidently leaked urine? N   Do you have problems with loss of bowel control? N   Managing your Medications? N   Managing your Finances? N   Housekeeping or managing your Housekeeping? N     Patient Care Team: Glean Hess, MD as PCP - General (Internal Medicine) Margaretha Sheffield, MD (Otolaryngology) Luster Landsberg, MD as Referring Physician (Gastroenterology) Stanford Breed, MD as Referring Physician (Cardiology) Idelle Leech, OD (Optometry)  Indicate any recent Medical Services you may have received from other than [facility] providers in the past year (date may be approximate).     Assessment:    This is a routine wellness examination for [facility].  Hearing/Vision screen Hearing Screening - Comments:: No concerns. Vision Screening - Comments:: Patient wears glasses.  Dietary issues and exercise activities discussed: Current Exercise Habits: Home exercise routine   Goals Addressed   None   Depression Screen    07/15/2022    9:25 AM 06/01/2022    8:30 AM 03/10/2022    1:30 PM 02/25/2022   11:21 AM 11/19/2021    8:02 AM 07/14/2021   10:07 AM 05/16/2021    8:05 AM  PHQ 2/9 Scores  PHQ - 2 Score 0 0 0 1 0 0 0  PHQ- 9 Score 0 '1 1 2 1 '$ 0 1    Fall Risk    07/15/2022    9:25 AM 06/01/2022    8:30 AM 03/10/2022    1:30 PM 02/25/2022   11:21 AM 11/19/2021    8:02 AM  Fall Risk   Falls in the past year? 1 1 0 0 0  Number falls in past yr: 1 0  0   Injury with Fall? 0 0 0 0 0  Risk for fall due to : History of fall(s) History of fall(s)  No Fall Risks   Follow up Falls evaluation completed Falls evaluation completed  Falls evaluation completed     [place]:  Any stairs in or around the home? Yes  If so, are there any without handrails? Yes  Home free of loose throw rugs in walkways, pet beds, electrical cords, etc? Yes  Adequate lighting in your home to reduce risk of falls? Yes   ASSISTIVE DEVICES UTILIZED TO PREVENT FALLS:  Life alert? No  Use of a cane, walker or w/c? No  Grab bars in the bathroom? No  Shower chair or bench in shower? No  Elevated toilet seat or a handicapped toilet? Yes   Cognitive Function:        07/15/2022    9:26 AM 07/10/2019    9:04 AM 06/23/2017    8:40 AM  6CIT Screen  What Year? 0 points 0 points 0 points  What month? 0 points 0 points 0 points  What time? 0 points 0 points 0 points  Count back from 20 0 points 0 points 0 points  Months in reverse 0 points 0 points 0 points  Repeat phrase 0 points 0 points 0 points  Total Score 0 points 0 points 0 points    Immunizations Immunization History   Administered Date(s) Administered   COVID-19, mRNA, vaccine(Comirnaty)12 years and older 06/10/2022   Fluad Quad(high Dose 65+) 06/01/2022   Influenza, High Dose Seasonal PF 05/09/2018, 03/29/2019, 04/17/2020, 05/07/2021   Influenza, Seasonal, Injecte, Preservative Fre 05/09/2014   Influenza,inj,Quad PF,6+ Mos 06/02/2016   Influenza-Unspecified 04/08/2017, 04/30/2018, 03/31/2019   PFIZER Comirnaty(Gray Top)Covid-19 Tri-Sucrose Vaccine 11/08/2020   PFIZER(Purple Top)SARS-COV-2 Vaccination 08/16/2019, 09/06/2019, 05/06/2020   Pfizer Covid-19 Vaccine Bivalent Booster 58yr & up 05/07/2021   Pneumococcal Conjugate-13 11/06/2013   Pneumococcal Polysaccharide-23 08/10/2010   Tdap 03/07/2010, 11/19/2021   Zoster Recombinat (Shingrix) 05/24/2018, 07/25/2018   Zoster, Live 11/02/2012    TDAP status: Up to date  Flu Vaccine status: Up to date  Pneumococcal vaccine status: Up to date  Covid-19 vaccine status: Completed vaccines  Qualifies for Shingles Vaccine? Yes   Zostavax completed Yes   Shingrix Completed?: Yes  Screening Tests Health Maintenance  Topic Date Due   Diabetic kidney evaluation - Urine ACR  11/20/2022   HEMOGLOBIN A1C  12/01/2022   OPHTHALMOLOGY EXAM  03/31/2023   Diabetic kidney evaluation - GFR measurement  06/02/2023   FOOT EXAM  06/02/2023   MAMMOGRAM  06/23/2023   Medicare Annual Wellness (AWV)  07/16/2023   DTaP/Tdap/Td (3 - Td or Tdap) 11/20/2031   Pneumonia Vaccine 78 Years old  Completed   INFLUENZA VACCINE  Completed   DEXA SCAN  Completed   COVID-19 Vaccine  Completed   Hepatitis C Screening  Completed   Zoster Vaccines- Shingrix  Completed   HPV VACCINES  Aged Out   COLONOSCOPY (Pts [phone] coverage will need to be confirmed)  Discontinued    Health Maintenance  There are no preventive care reminders to display for this patient.   Colonoscopy: Patient DECLINES any future colonoscopies.  Mammogram status: Completed 06/22/2022.  Repeat every year  Bone Density status: Completed 08/28/2020. Results reflect: Bone density results: OSTEOPENIA. Repeat every 2 years.  Lung Cancer Screening: (Low Dose CT Chest recommended if Age 78-80ears, 30 pack-year currently smoking OR have quit w/in 15years.) does not qualify.   Additional Screening:  Hepatitis C Screening: does qualify; Completed 12/04/2013  Vision Screening: Recommended annual ophthalmology exams for early detection of glaucoma and other disorders of the eye. Is the patient up to date with their annual eye exam?  Yes  Who is the provider or what is the name of the office in which the patient attends annual eye exams? [facility]  Dental Screening: Recommended annual dental exams for proper oral hygiene  Community Resource Referral / Chronic Care Management: CRR required this visit?  No   CCM required this visit?  No      Plan:     I have personally reviewed and noted the following in the patient's chart:   Medical and social history Use  of alcohol, tobacco or illicit drugs  Current medications and supplements including opioid prescriptions. Patient is not currently taking opioid prescriptions. Functional ability and status Nutritional status Physical activity Advanced directives List of other physicians Hospitalizations, surgeries, and ER visits in previous 12 months Vitals Screenings to include cognitive, depression, and falls Referrals and appointments  In addition, I have reviewed and discussed with patient certain preventive protocols, quality metrics, and best practice recommendations. A written personalized care plan for preventive services as well as general preventive health recommendations were provided to patient.     Clista Bernhardt, [place]   07/15/2022    Ms. Bango , Thank you for taking time to come for your Medicare Wellness Visit. I appreciate your ongoing commitment to your health goals. Please review the following  plan we discussed and let me know if I can assist you in the future.   These are the goals we discussed:  Goals      DIET - REDUCE FAT INTAKE     Patient would like to lower overall cholesterol with diet and exercise.      Increase physical activity     Recommend increasing physical activity to 150 minutes per week     Weight (lb) < 200 lb (90.7 kg)     Pt would like to set a goal for 5% weight loss. Continue exercise and healthy eating habits.         This is a list of the screening recommended for you and due dates:  Health Maintenance  Topic Date Due   Yearly kidney health urinalysis for diabetes  11/20/2022   Hemoglobin A1C  12/01/2022   Eye exam for diabetics  03/31/2023   Yearly kidney function blood test for diabetes  06/02/2023   Complete foot exam   06/02/2023   Mammogram  06/23/2023   Medicare Annual Wellness Visit  07/16/2023   DTaP/Tdap/Td vaccine (3 - Td or Tdap) 11/20/2031   Pneumonia Vaccine  Completed   Flu Shot  Completed   DEXA scan (bone density measurement)  Completed   COVID-19 Vaccine  Completed   Hepatitis C Screening: USPSTF Recommendation to screen - Ages 29-79 yo.  Completed   Zoster (Shingles) Vaccine  Completed   HPV Vaccine  Aged Out   Colon Cancer Screening  Discontinued     Nurse Notes: None.

## 2022-07-22 DIAGNOSIS — H40053 Ocular hypertension, bilateral: Secondary | ICD-10-CM | POA: Diagnosis not present

## 2022-07-22 DIAGNOSIS — H26493 Other secondary cataract, bilateral: Secondary | ICD-10-CM | POA: Diagnosis not present

## 2022-07-22 DIAGNOSIS — Z7984 Long term (current) use of oral hypoglycemic drugs: Secondary | ICD-10-CM | POA: Diagnosis not present

## 2022-07-22 DIAGNOSIS — H401131 Primary open-angle glaucoma, bilateral, mild stage: Secondary | ICD-10-CM | POA: Diagnosis not present

## 2022-07-22 DIAGNOSIS — R7309 Other abnormal glucose: Secondary | ICD-10-CM | POA: Diagnosis not present

## 2022-07-22 DIAGNOSIS — Z961 Presence of intraocular lens: Secondary | ICD-10-CM | POA: Diagnosis not present

## 2022-08-02 ENCOUNTER — Other Ambulatory Visit: Payer: Self-pay | Admitting: Internal Medicine

## 2022-08-02 DIAGNOSIS — E1169 Type 2 diabetes mellitus with other specified complication: Secondary | ICD-10-CM

## 2022-09-10 IMAGING — MG DIGITAL SCREENING BILAT W/ TOMO W/ CAD
8 series · 8 of 24 positions shown · non-contrast
Comparison: Previous exam(s).

CLINICAL DATA: Screening.

EXAM:
DIGITAL SCREENING BILATERAL MAMMOGRAM WITH TOMO AND CAD

[L MLO synth-2D]
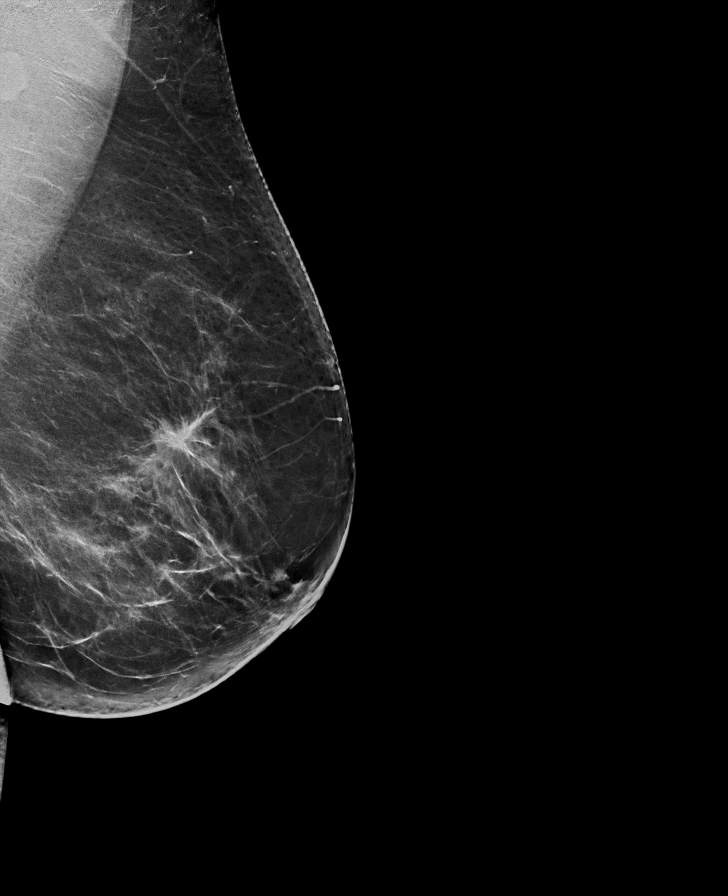

[R MLO synth-2D]
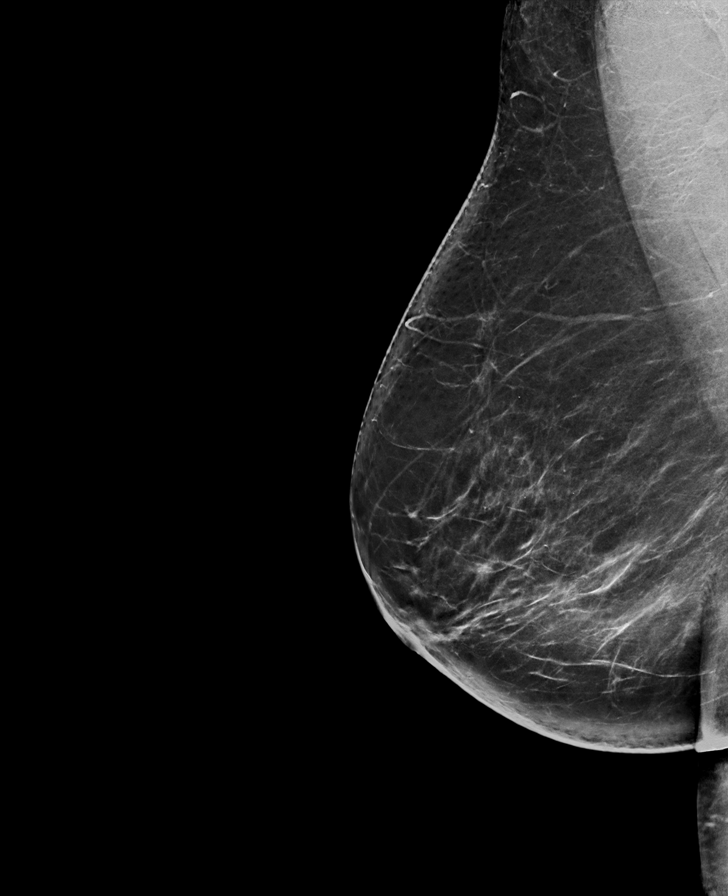

[R CC synth-2D]
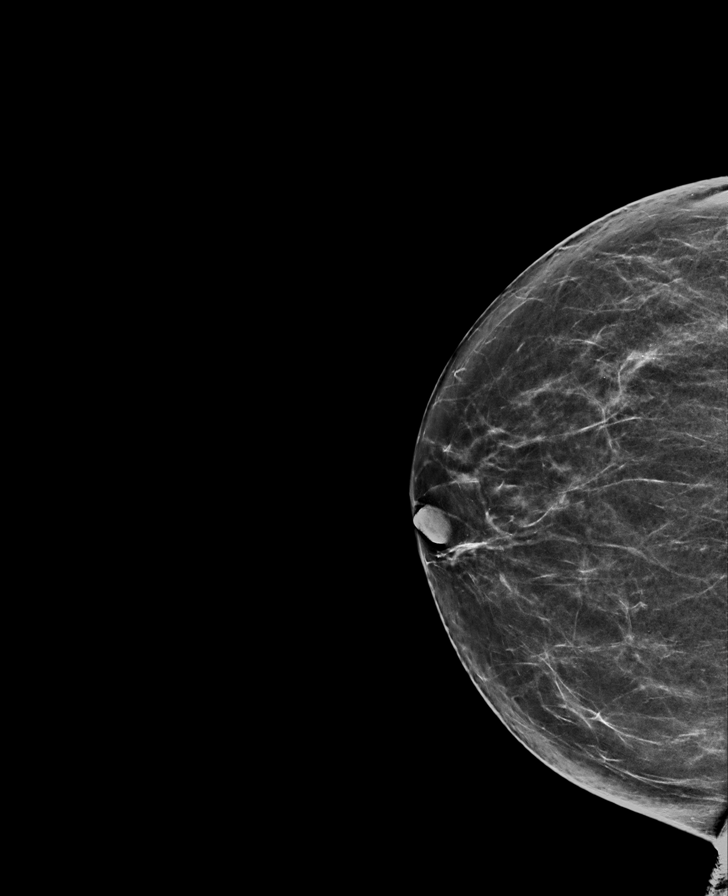

[L CC synth-2D]
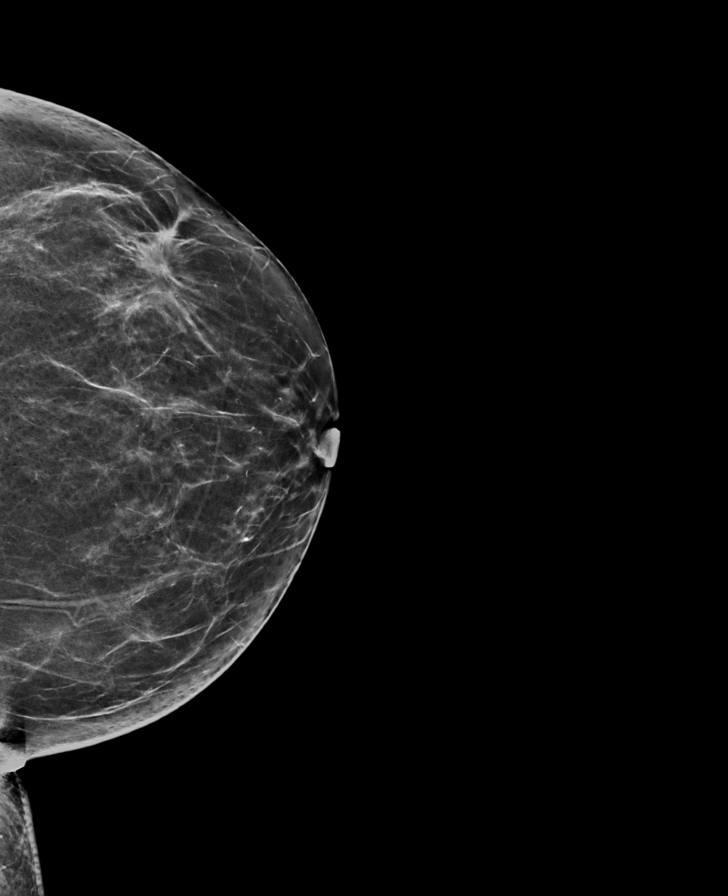

[R MLO tomo · tomo slice 43/85.0]
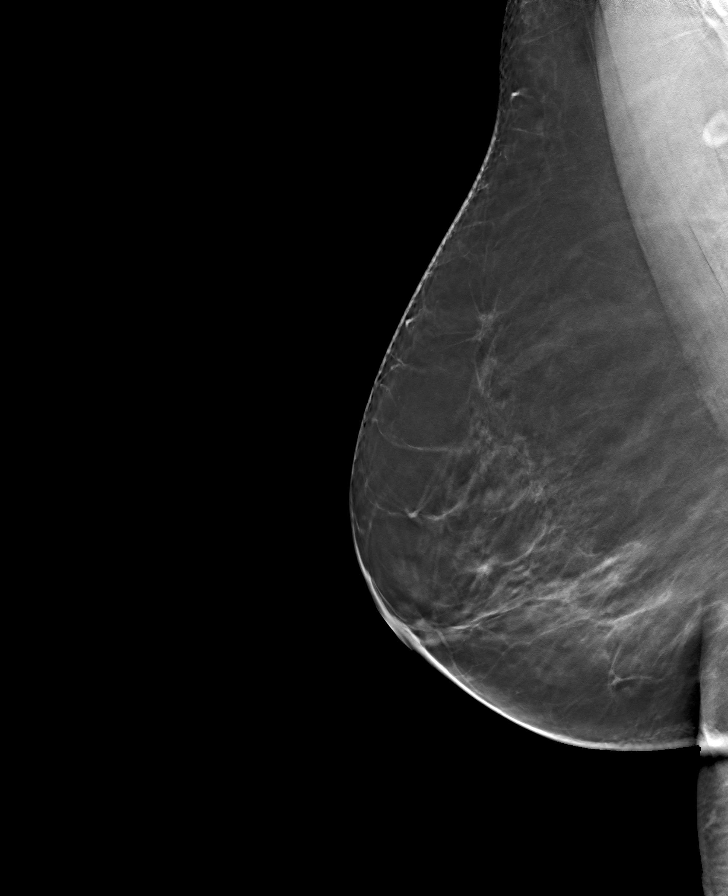

[L MLO tomo · tomo slice 41/81.0]
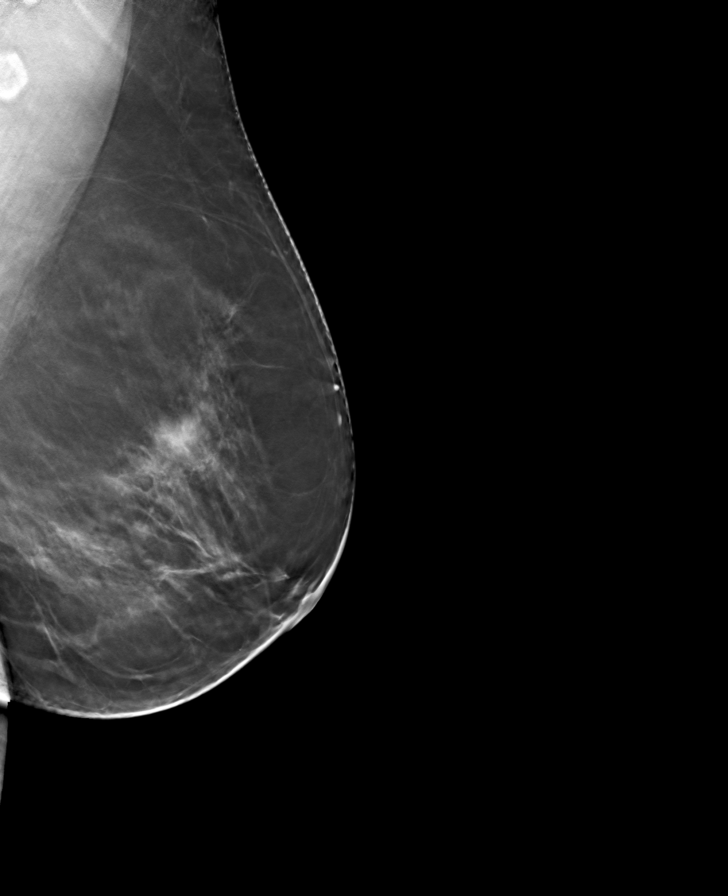

[L CC tomo · tomo slice 35/70.0]
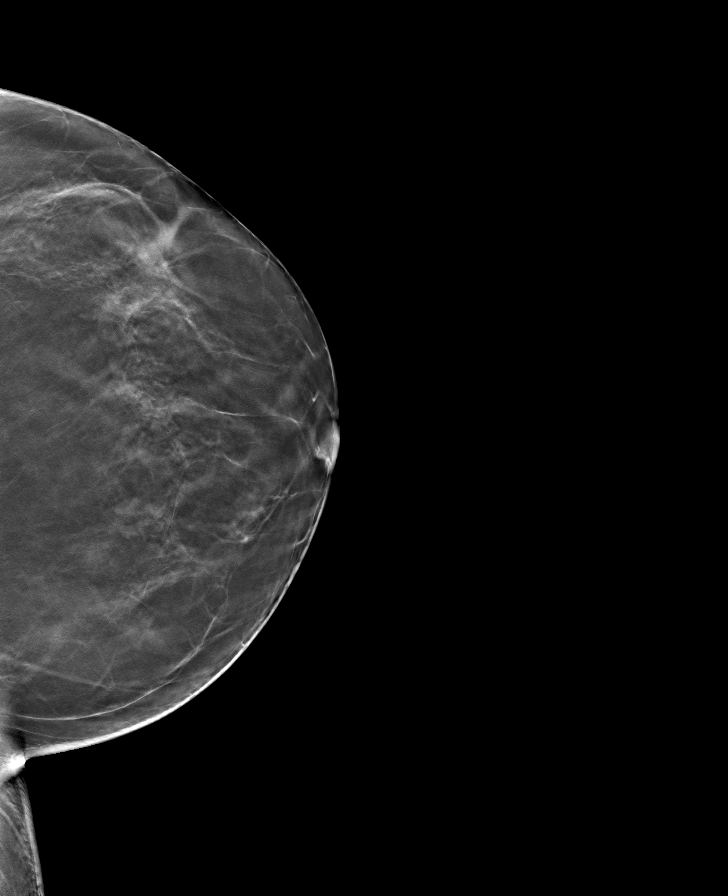

[R CC tomo · tomo slice 36/71.0]
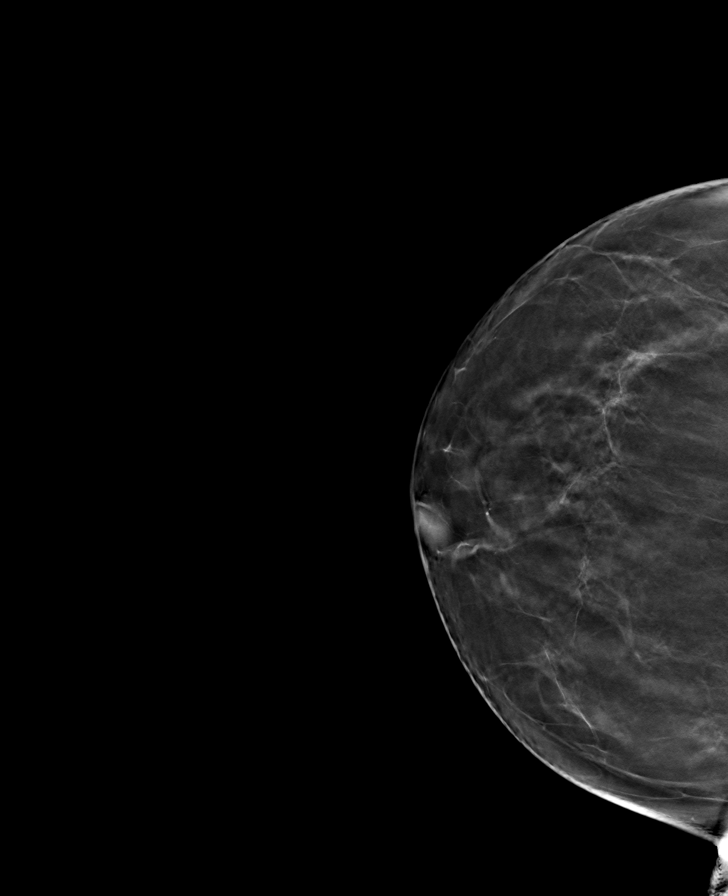

[8 of 24 positions shown; findings below may reference images not displayed]

ACR Breast Density Category b: There are scattered areas of
fibroglandular density.
FINDINGS: There are no findings suspicious for malignancy. Images were
processed with CAD.
IMPRESSION: No mammographic evidence of malignancy. A result letter of this
screening mammogram will be mailed directly to the patient.

RECOMMENDATION:
Screening mammogram in one year. (Code:[id number])

BI-RADS CATEGORY  1: Negative.

## 2022-10-02 ENCOUNTER — Ambulatory Visit: Payer: Medicare Other | Admitting: Internal Medicine

## 2022-10-09 ENCOUNTER — Encounter: Payer: Self-pay | Admitting: Internal Medicine

## 2022-10-09 ENCOUNTER — Ambulatory Visit (INDEPENDENT_AMBULATORY_CARE_PROVIDER_SITE_OTHER): Payer: Medicare Other | Admitting: Internal Medicine

## 2022-10-09 VITALS — BP 118/70 | HR 72 | Ht 63.0 in | Wt 179.0 lb

## 2022-10-09 DIAGNOSIS — E1169 Type 2 diabetes mellitus with other specified complication: Secondary | ICD-10-CM

## 2022-10-09 DIAGNOSIS — I10 Essential (primary) hypertension: Secondary | ICD-10-CM

## 2022-10-09 DIAGNOSIS — E118 Type 2 diabetes mellitus with unspecified complications: Secondary | ICD-10-CM

## 2022-10-09 DIAGNOSIS — E785 Hyperlipidemia, unspecified: Secondary | ICD-10-CM

## 2022-10-09 NOTE — Assessment & Plan Note (Signed)
Clinically stable exam with well controlled BP on lisinopril. Tolerating medications without side effects. Pt to continue current regimen and low sodium diet.

## 2022-10-09 NOTE — Progress Notes (Signed)
Date:  10/09/2022   Name:  Dawn Fields   DOB:  1945-11-01   MRN:  [id number]   Chief Complaint: Diabetes and Hypertension  Hypertension This is a chronic problem. The problem is controlled. Pertinent negatives include no chest pain, headaches, palpitations or shortness of breath. Past treatments include ACE inhibitors. The current treatment provides significant improvement.  Diabetes She presents for her follow-up diabetic visit. She has type 2 diabetes mellitus. Her disease course has been stable. Pertinent negatives for hypoglycemia include no headaches or tremors. Pertinent negatives for diabetes include no chest pain, no fatigue, no polydipsia and no polyuria.    Lab Results  Component Value Date   NA 140 06/01/2022   K 4.3 06/01/2022   CO2 22 06/01/2022   GLUCOSE 104 (H) 06/01/2022   BUN 15 06/01/2022   CREATININE 0.99 06/01/2022   CALCIUM 10.0 06/01/2022   EGFR 59 (L) 06/01/2022   GFRNONAA >60 03/28/2021   Lab Results  Component Value Date   CHOL 180 06/01/2022   HDL 79 06/01/2022   LDLCALC 87 06/01/2022   TRIG 78 06/01/2022   CHOLHDL 2.3 06/01/2022   Lab Results  Component Value Date   TSH 1.420 06/01/2022   Lab Results  Component Value Date   HGBA1C 6.5 (H) 06/01/2022   Lab Results  Component Value Date   WBC 6.2 06/01/2022   HGB 11.5 06/01/2022   HCT 34.8 06/01/2022   MCV 88 06/01/2022   PLT 335 06/01/2022   Lab Results  Component Value Date   ALT 29 06/01/2022   AST 26 06/01/2022   ALKPHOS 84 06/01/2022   BILITOT 0.3 06/01/2022   No results found for: "25OHVITD2", "25OHVITD3", "VD25OH"   Review of Systems  Constitutional:  Negative for appetite change, fatigue, fever and unexpected weight change.  HENT:  Negative for tinnitus and trouble swallowing.   Eyes:  Negative for visual disturbance.  Respiratory:  Negative for cough, chest tightness and shortness of breath.   Cardiovascular:  Negative for chest pain, palpitations and leg swelling.   Gastrointestinal:  Negative for abdominal pain.  Endocrine: Negative for polydipsia and polyuria.  Genitourinary:  Negative for dysuria and hematuria.  Musculoskeletal:  Negative for arthralgias.  Neurological:  Negative for tremors, numbness and headaches.  Psychiatric/Behavioral:  Negative for dysphoric mood.     Patient Active Problem List   Diagnosis Date Noted   Lipoma of right shoulder    Lipoma of neck 09/26/2020   Lipoma of anterior chest wall 01/04/2020   Radial scar of left breast    Abnormal breast biopsy 09/01/2019   Statin myopathy 08/13/2019   Cataracts, both eyes 04/12/2019   Carpal tunnel syndrome of right wrist 12/26/2018   Type II diabetes mellitus with complication ([place]) 99991111   Knee instability, left 12/24/2017   History of colonic polyps 12/16/2016   Essential hypertension 12/11/2016   Hyperlipidemia associated with type 2 diabetes mellitus ([place]) 12/11/2016   Gastroesophageal reflux disease 12/11/2016   Kidney stones 12/11/2016   Osteopenia 12/11/2016   Multinodular goiter 06/09/2016    Allergies  Allergen Reactions   Esomeprazole Magnesium Other (See Comments)    Other Reaction: GI UPSET   Rosuvastatin Other (See Comments)    Joint and muscle pain   Atorvastatin Other (See Comments)    Joint pain    Past Surgical History:  Procedure Laterality Date   ABDOMINAL HYSTERECTOMY     Partial - Still have ovaries. - Cyst on uterus    BREAST  BIOPSY Left 07/20/2019   sclerosing lesion   BREAST BIOPSY Left 09/29/2019   NL-neg   breast lesion removed Left 2021   BREAST LUMPECTOMY WITH NEEDLE LOCALIZATION Left 09/29/2019   Procedure: BREAST LUMPECTOMY WITH NEEDLE LOCALIZATION;  Surgeon: Olean Ree, MD;  Location: [facility] ORS;  Service: General;  Laterality: Left;   BREAST SURGERY     biopsy left breast   COLONOSCOPY  02/04/2018   one polyp -    DILATION AND CURETTAGE OF UTERUS     EYE SURGERY     cateract bilateral   fibroid biopsy     LIPOMA  EXCISION Right 04/03/2021   Procedure: EXCISION LIPOMA, right supraclavicular & right shoulder lipomas x 2;  Surgeon: Olean Ree, MD;  Location: [facility] ORS;  Service: General;  Laterality: Right;  Right supraclavicular and right shoulder lipomas   TONSILLECTOMY      Social History   Tobacco Use   Smoking status: Never   Smokeless tobacco: Never  Vaping Use   Vaping Use: Never used  Substance Use Topics   Alcohol use: No   Drug use: No     Medication list has been reviewed and updated.  Current Meds  Medication Sig   acetaminophen (TYLENOL) 500 MG tablet Take 2 tablets (1,000 mg total) by mouth every 6 (six) hours as needed for mild pain.   aspirin EC 81 MG tablet Take 81 mg by mouth daily.   Cholecalciferol 25 MCG (1000 UT) tablet Take 1,000 Units by mouth daily.    docusate sodium (COLACE) 100 MG capsule Take 100 mg by mouth 2 (two) times daily as needed.    ezetimibe (ZETIA) 10 MG tablet TAKE 1 TABLET BY MOUTH EVERY DAY   Lancets (ONETOUCH DELICA PLUS [id number]) MISC USE AS DIRECTED ONCE DAILY   latanoprost (XALATAN) 0.005 % ophthalmic solution Place 1 drop into both eyes at bedtime.   lisinopril (ZESTRIL) 10 MG tablet TAKE 1 TABLET BY MOUTH TWICE A DAY   Omega-3 Fatty Acids (FISH OIL) 1000 MG CAPS Take 2,000 mg by mouth daily.    ONETOUCH ULTRA test strip USE AS DIRECTED ONCE DAILY   zinc gluconate 50 MG tablet Take 50 mg by mouth daily.       10/09/2022    8:07 AM 06/01/2022    8:30 AM 03/10/2022    1:30 PM 02/25/2022   11:21 AM  GAD 7 : Generalized Anxiety Score  Nervous, Anxious, on Edge 0 0 0 0  Control/stop worrying 0 0 0 0  Worry too much - different things 0 0 0 0  Trouble relaxing 0 0 0 0  Restless 0 0 0 0  Easily annoyed or irritable 0 0 0 0  Afraid - awful might happen 0 0 0 0  Total GAD 7 Score 0 0 0 0  Anxiety Difficulty Not difficult at all Not difficult at all Not difficult at all Not difficult at all       10/09/2022    8:07 AM 07/15/2022    9:25 AM  06/01/2022    8:30 AM  Depression screen PHQ 2/9  Decreased Interest 0 0 0  Down, Depressed, Hopeless 0 0 0  PHQ - 2 Score 0 0 0  Altered sleeping 0 0 0  Tired, decreased energy 0 0 1  Change in appetite 0 0 0  Feeling bad or failure about yourself  0 0 0  Trouble concentrating 0 0 0  Moving slowly or fidgety/restless 0 0 0  Suicidal  thoughts 0 0 0  PHQ-9 Score 0 0 1  Difficult doing work/chores Not difficult at all Not difficult at all Not difficult at all    BP Readings from Last 3 Encounters:  10/09/22 118/70  06/01/22 128/70  03/10/22 130/82    Physical Exam Vitals and nursing note reviewed.  Constitutional:      General: She is not in acute distress.    Appearance: She is well-developed.  HENT:     Head: Normocephalic and atraumatic.  Cardiovascular:     Rate and Rhythm: Normal rate and regular rhythm.  Pulmonary:     Effort: Pulmonary effort is normal. No respiratory distress.     Breath sounds: No wheezing or rhonchi.  Musculoskeletal:     Cervical back: Normal range of motion.     Right lower leg: No edema.     Left lower leg: No edema.  Lymphadenopathy:     Cervical: No cervical adenopathy.  Skin:    General: Skin is warm and dry.     Capillary Refill: Capillary refill takes less than 2 seconds.     Findings: No rash.  Neurological:     General: No focal deficit present.     Mental Status: She is alert and oriented to person, place, and time.  Psychiatric:        Mood and Affect: Mood normal.        Behavior: Behavior normal.     Wt Readings from Last 3 Encounters:  10/09/22 179 lb (81.2 kg)  07/15/22 177 lb 9.6 oz (80.6 kg)  06/01/22 177 lb 9.6 oz (80.6 kg)    BP 118/70   Pulse 72   Ht '5\' 3"'$  (1.6 m)   Wt 179 lb (81.2 kg)   SpO2 95%   BMI 31.71 kg/m   Assessment and Plan: Problem List Items Addressed This Visit       Cardiovascular and Mediastinum   Essential hypertension - Primary (Chronic)    Clinically stable exam with well  controlled BP on lisinopril. Tolerating medications without side effects. Pt to continue current regimen and low sodium diet.         Endocrine   Hyperlipidemia associated with type 2 diabetes mellitus (HCC) (Chronic)    Intolerant of several statins On zetia and low fat diet      Type II diabetes mellitus with complication (HCC) (Chronic)    Clinically stable without s/s of hypoglycemia.  Am glucoses usually 120; today 140 Tolerating diet only. Lab Results  Component Value Date   HGBA1C 6.5 (H) 06/01/2022  Repeat A1C today       Relevant Orders   POCT glycosylated hemoglobin (Hb A1C)   Hemoglobin A1c     Partially dictated using Editor, commissioning. Any errors are unintentional.  Halina Maidens, MD [facility]  10/09/2022

## 2022-10-09 NOTE — Assessment & Plan Note (Signed)
Intolerant of several statins On zetia and low fat diet

## 2022-10-09 NOTE — Assessment & Plan Note (Addendum)
Clinically stable without s/s of hypoglycemia.  Am glucoses usually 120; today 140 Tolerating diet only. Lab Results  Component Value Date   HGBA1C 6.5 (H) 06/01/2022  Repeat A1C today

## 2022-10-10 LAB — HEMOGLOBIN A1C
Est. average glucose Bld gHb Est-mCnc: 154 mg/dL
Hgb A1c MFr Bld: 7 % — ABNORMAL HIGH (ref 4.8–5.6)

## 2022-11-26 DIAGNOSIS — H26493 Other secondary cataract, bilateral: Secondary | ICD-10-CM | POA: Diagnosis not present

## 2022-11-26 DIAGNOSIS — H40053 Ocular hypertension, bilateral: Secondary | ICD-10-CM | POA: Diagnosis not present

## 2022-11-26 DIAGNOSIS — H401131 Primary open-angle glaucoma, bilateral, mild stage: Secondary | ICD-10-CM | POA: Diagnosis not present

## 2022-11-26 DIAGNOSIS — Z961 Presence of intraocular lens: Secondary | ICD-10-CM | POA: Diagnosis not present

## 2022-11-26 DIAGNOSIS — R7309 Other abnormal glucose: Secondary | ICD-10-CM | POA: Diagnosis not present

## 2022-11-26 DIAGNOSIS — Z7984 Long term (current) use of oral hypoglycemic drugs: Secondary | ICD-10-CM | POA: Diagnosis not present

## 2022-11-27 ENCOUNTER — Other Ambulatory Visit: Payer: Self-pay | Admitting: Internal Medicine

## 2022-11-27 DIAGNOSIS — I10 Essential (primary) hypertension: Secondary | ICD-10-CM

## 2022-11-27 NOTE — Telephone Encounter (Signed)
Requested Prescriptions  Pending Prescriptions Disp Refills   lisinopril (ZESTRIL) 10 MG tablet [Pharmacy Med Name: LISINOPRIL 10 MG TABLET] 180 tablet 1    Sig: TAKE 1 TABLET BY MOUTH TWICE A DAY     Cardiovascular:  ACE Inhibitors Passed - 11/27/2022  1:53 AM      Passed - Cr in normal range and within 180 days    Creatinine, Ser  Date Value Ref Range Status  06/01/2022 0.99 0.57 - 1.00 mg/dL Final         Passed - K in normal range and within 180 days    Potassium  Date Value Ref Range Status  06/01/2022 4.3 3.5 - 5.2 mmol/L Final         Passed - Patient is not pregnant      Passed - Last BP in normal range    BP Readings from Last 1 Encounters:  10/09/22 118/70         Passed - Valid encounter within last 6 months    Recent Outpatient Visits           1 month ago Essential hypertension   [place] [facility] at [facility], Nyoka Cowden, MD   5 months ago Annual physical exam   [facility] at [facility], Nyoka Cowden, MD   8 months ago COVID-19   [facility] at [facility], Ocie Bob, MD   9 months ago Type II diabetes mellitus with complication [facility])   [place] [facility] at [facility], Nyoka Cowden, MD   1 year ago Essential hypertension   [place] [facility] at [facility], Nyoka Cowden, MD       Future Appointments             In 6 months Judithann Graves, Nyoka Cowden, MD [facility] at [facility], [facility]

## 2023-01-17 ENCOUNTER — Other Ambulatory Visit: Payer: Self-pay | Admitting: Internal Medicine

## 2023-01-18 NOTE — Telephone Encounter (Signed)
Requested Prescriptions  Pending Prescriptions Disp Refills   ONETOUCH ULTRA test strip [Pharmacy Med Name: ONE TOUCH ULTRA BLUE TEST STRP] 100 strip 1    Sig: USE AS DIRECTED ONCE DAILY     Endocrinology: Diabetes - Testing Supplies Passed - 01/17/2023  8:44 AM      Passed - Valid encounter within last 12 months    Recent Outpatient Visits           3 months ago Essential hypertension   [place] [facility] at [facility], Nyoka Cowden, MD   7 months ago Annual physical exam   [facility] at [facility], Nyoka Cowden, MD   10 months ago COVID-19   [facility] at [facility], Ocie Bob, MD   10 months ago Type II diabetes mellitus with complication [facility])   [place] [facility] at [facility], Nyoka Cowden, MD   1 year ago Essential hypertension   [place] [facility] at [facility], Nyoka Cowden, MD       Future Appointments             In 4 months Judithann Graves, Nyoka Cowden, MD [facility] Primary [facility] & [facility] Medicine at [facility], [facility]

## 2023-01-20 DIAGNOSIS — Z86010 Personal history of colonic polyps: Secondary | ICD-10-CM | POA: Diagnosis not present

## 2023-02-19 ENCOUNTER — Other Ambulatory Visit: Payer: Self-pay | Admitting: Internal Medicine

## 2023-03-29 DIAGNOSIS — R7309 Other abnormal glucose: Secondary | ICD-10-CM | POA: Diagnosis not present

## 2023-03-29 DIAGNOSIS — H26493 Other secondary cataract, bilateral: Secondary | ICD-10-CM | POA: Diagnosis not present

## 2023-03-29 DIAGNOSIS — Z7984 Long term (current) use of oral hypoglycemic drugs: Secondary | ICD-10-CM | POA: Diagnosis not present

## 2023-03-29 DIAGNOSIS — H40053 Ocular hypertension, bilateral: Secondary | ICD-10-CM | POA: Diagnosis not present

## 2023-03-29 DIAGNOSIS — H401131 Primary open-angle glaucoma, bilateral, mild stage: Secondary | ICD-10-CM | POA: Diagnosis not present

## 2023-03-29 DIAGNOSIS — Z961 Presence of intraocular lens: Secondary | ICD-10-CM | POA: Diagnosis not present

## 2023-03-29 LAB — HM DIABETES EYE EXAM

## 2023-04-27 ENCOUNTER — Other Ambulatory Visit: Payer: Self-pay | Admitting: Internal Medicine

## 2023-04-27 DIAGNOSIS — E1169 Type 2 diabetes mellitus with other specified complication: Secondary | ICD-10-CM

## 2023-05-12 ENCOUNTER — Other Ambulatory Visit: Payer: Self-pay | Admitting: Internal Medicine

## 2023-05-12 DIAGNOSIS — Z1231 Encounter for screening mammogram for malignant neoplasm of breast: Secondary | ICD-10-CM

## 2023-05-14 DIAGNOSIS — K635 Polyp of colon: Secondary | ICD-10-CM | POA: Diagnosis not present

## 2023-05-14 DIAGNOSIS — Z860100 Personal history of colon polyps, unspecified: Secondary | ICD-10-CM | POA: Diagnosis not present

## 2023-05-14 DIAGNOSIS — Z1211 Encounter for screening for malignant neoplasm of colon: Secondary | ICD-10-CM | POA: Diagnosis not present

## 2023-05-14 DIAGNOSIS — D124 Benign neoplasm of descending colon: Secondary | ICD-10-CM | POA: Diagnosis not present

## 2023-05-14 DIAGNOSIS — K5730 Diverticulosis of large intestine without perforation or abscess without bleeding: Secondary | ICD-10-CM | POA: Diagnosis not present

## 2023-05-14 LAB — HM COLONOSCOPY

## 2023-05-21 ENCOUNTER — Other Ambulatory Visit: Payer: Self-pay | Admitting: Internal Medicine

## 2023-05-21 DIAGNOSIS — I10 Essential (primary) hypertension: Secondary | ICD-10-CM

## 2023-05-21 NOTE — Telephone Encounter (Signed)
Requested Prescriptions  Pending Prescriptions Disp Refills   lisinopril (ZESTRIL) 10 MG tablet [Pharmacy Med Name: LISINOPRIL 10 MG TABLET] 180 tablet 0    Sig: TAKE 1 TABLET BY MOUTH TWICE A DAY     Cardiovascular:  ACE Inhibitors Failed - 05/21/2023  1:35 AM      Failed - Cr in normal range and within 180 days    Creatinine, Ser  Date Value Ref Range Status  06/01/2022 0.99 0.57 - 1.00 mg/dL Final         Failed - K in normal range and within 180 days    Potassium  Date Value Ref Range Status  06/01/2022 4.3 3.5 - 5.2 mmol/L Final         Failed - Valid encounter within last 6 months    Recent Outpatient Visits           7 months ago Essential hypertension   [place] [facility] at [facility], Nyoka Cowden, MD   11 months ago Annual physical exam   [facility] Medicine at [facility], Nyoka Cowden, MD   1 year ago COVID-19   [facility] at [facility], Ocie Bob, MD   1 year ago Type II diabetes mellitus with complication [facility])   [place] [facility] at [facility], Nyoka Cowden, MD   1 year ago Essential hypertension   [place] [facility] at [facility], Nyoka Cowden, MD       Future Appointments             In 2 weeks Judithann Graves Nyoka Cowden, MD [facility] Primary Care & Sports Medicine at [facility], [facility]            Passed - Patient is not pregnant      Passed - Last BP in normal range    BP Readings from Last 1 Encounters:  10/09/22 118/70

## 2023-05-28 ENCOUNTER — Encounter: Payer: Self-pay | Admitting: Internal Medicine

## 2023-06-04 ENCOUNTER — Encounter: Payer: Medicare Other | Admitting: Internal Medicine

## 2023-06-15 ENCOUNTER — Ambulatory Visit (INDEPENDENT_AMBULATORY_CARE_PROVIDER_SITE_OTHER): Payer: Medicare Other | Admitting: Internal Medicine

## 2023-06-15 ENCOUNTER — Encounter: Payer: Self-pay | Admitting: Internal Medicine

## 2023-06-15 VITALS — BP 120/72 | HR 68 | Ht 63.0 in | Wt 180.0 lb

## 2023-06-15 DIAGNOSIS — I10 Essential (primary) hypertension: Secondary | ICD-10-CM

## 2023-06-15 DIAGNOSIS — E118 Type 2 diabetes mellitus with unspecified complications: Secondary | ICD-10-CM

## 2023-06-15 DIAGNOSIS — E785 Hyperlipidemia, unspecified: Secondary | ICD-10-CM

## 2023-06-15 DIAGNOSIS — Z0000 Encounter for general adult medical examination without abnormal findings: Secondary | ICD-10-CM

## 2023-06-15 DIAGNOSIS — E1169 Type 2 diabetes mellitus with other specified complication: Secondary | ICD-10-CM | POA: Diagnosis not present

## 2023-06-15 DIAGNOSIS — Z1231 Encounter for screening mammogram for malignant neoplasm of breast: Secondary | ICD-10-CM | POA: Diagnosis not present

## 2023-06-15 NOTE — Assessment & Plan Note (Addendum)
Blood sugars stable without hypoglycemic symptoms or events. FSBS ~ 130's, no low readings. Currently managed with diet. Changes made last visit are none. Lab Results  Component Value Date   HGBA1C 7.0 (H) 10/09/2022

## 2023-06-15 NOTE — Assessment & Plan Note (Signed)
LDL is  Lab Results  Component Value Date   LDLCALC 87 06/01/2022    Current regimen is zetia.  Tolerating medications well without issues.

## 2023-06-15 NOTE — Assessment & Plan Note (Signed)
 Controlled BP with normal exam. Current regimen is lisinopril. Will continue same medications; encourage continued reduced sodium diet.

## 2023-06-15 NOTE — Progress Notes (Signed)
Date:  06/15/2023   Name:  Dawn Fields   DOB:  Oct 10, 1945   MRN:  [id number]   Chief Complaint: Annual Exam Dawn Fields is a 77 y.o. female who presents today for her Complete Annual Exam. She feels well. She reports exercising - some. She reports she is sleeping well. Breast complaints - none.  Mammogram: scheduled 06/24/23 DEXA: 08/2020 osteopenia Colonoscopy: 05/14/23 repeat 5 yrs  Health Maintenance Due  Topic Date Due   Diabetic kidney evaluation - Urine ACR  11/20/2022   HEMOGLOBIN A1C  04/11/2023   Diabetic kidney evaluation - eGFR measurement  06/02/2023   Medicare Annual Wellness (AWV)  07/16/2023    Immunization History  Administered Date(s) Administered   Fluad Quad(high Dose 65+) 06/01/2022   Influenza, High Dose Seasonal PF 05/09/2018, 03/29/2019, 04/17/2020, 05/07/2021, 04/20/2023   Influenza, Seasonal, Injecte, Preservative Fre 05/09/2014   Influenza,inj,Quad PF,6+ Mos 06/02/2016   Influenza-Unspecified 04/08/2017, 04/30/2018, 03/31/2019   Moderna Covid-19 Fall Seasonal Vaccine 52yrs & older 04/20/2023   PFIZER Comirnaty(Gray Top)Covid-19 Tri-Sucrose Vaccine 11/08/2020   PFIZER(Purple Top)SARS-COV-2 Vaccination 08/16/2019, 09/06/2019, 05/06/2020   Pfizer Covid-19 Vaccine Bivalent Booster 55yrs & up 05/07/2021   Pfizer(Comirnaty)Fall Seasonal Vaccine 12 years and older 06/10/2022   Pneumococcal Conjugate-13 11/06/2013   Pneumococcal Polysaccharide-23 08/10/2010   Tdap 03/07/2010, 11/19/2021   Zoster Recombinant(Shingrix) 05/24/2018, 07/25/2018   Zoster, Live 11/02/2012     Hypertension This is a chronic problem. The problem is controlled. Pertinent negatives include no chest pain, headaches, palpitations or shortness of breath. Past treatments include ACE inhibitors. The current treatment provides significant improvement. There is no history of kidney disease, CAD/MI or CVA.  Diabetes She presents for her follow-up diabetic visit. She has type 2 diabetes  mellitus. Pertinent negatives for hypoglycemia include no dizziness, headaches, nervousness/anxiousness or tremors. Pertinent negatives for diabetes include no chest pain, no fatigue, no polydipsia and no polyuria. Pertinent negatives for diabetic complications include no CVA. Current diabetic treatment includes diet. Her weight is stable. An ACE inhibitor/angiotensin II receptor blocker is being taken.  Hyperlipidemia This is a chronic problem. The problem is uncontrolled. Recent lipid tests were reviewed and are high. Pertinent negatives include no chest pain or shortness of breath. Current antihyperlipidemic treatment includes ezetimibe.    Review of Systems  Constitutional:  Negative for chills, fatigue and fever.  HENT:  Negative for congestion, hearing loss, tinnitus, trouble swallowing and voice change.   Eyes:  Negative for visual disturbance.  Respiratory:  Negative for cough, chest tightness, shortness of breath and wheezing.   Cardiovascular:  Negative for chest pain, palpitations and leg swelling.  Gastrointestinal:  Negative for abdominal pain, constipation, diarrhea and vomiting.  Endocrine: Negative for polydipsia and polyuria.  Genitourinary:  Negative for dysuria, frequency, genital sores, vaginal bleeding and vaginal discharge.  Musculoskeletal:  Negative for arthralgias, gait problem and joint swelling.  Skin:  Negative for color change and rash.  Neurological:  Negative for dizziness, tremors, light-headedness and headaches.  Hematological:  Negative for adenopathy. Does not bruise/bleed easily.  Psychiatric/Behavioral:  Negative for dysphoric mood and sleep disturbance. The patient is not nervous/anxious.      Lab Results  Component Value Date   NA 140 06/01/2022   K 4.3 06/01/2022   CO2 22 06/01/2022   GLUCOSE 104 (H) 06/01/2022   BUN 15 06/01/2022   CREATININE 0.99 06/01/2022   CALCIUM 10.0 06/01/2022   EGFR 59 (L) 06/01/2022   GFRNONAA >60 03/28/2021   Lab  Results  Component Value  Date   CHOL 180 06/01/2022   HDL 79 06/01/2022   LDLCALC 87 06/01/2022   TRIG 78 06/01/2022   CHOLHDL 2.3 06/01/2022   Lab Results  Component Value Date   TSH 1.420 06/01/2022   Lab Results  Component Value Date   HGBA1C 7.0 (H) 10/09/2022   Lab Results  Component Value Date   WBC 6.2 06/01/2022   HGB 11.5 06/01/2022   HCT 34.8 06/01/2022   MCV 88 06/01/2022   PLT 335 06/01/2022   Lab Results  Component Value Date   ALT 29 06/01/2022   AST 26 06/01/2022   ALKPHOS 84 06/01/2022   BILITOT 0.3 06/01/2022   No results found for: "25OHVITD2", "25OHVITD3", "VD25OH"   Patient Active Problem List   Diagnosis Date Noted   Lipoma of right shoulder    Lipoma of neck 09/26/2020   Lipoma of anterior chest wall 01/04/2020   Radial scar of left breast    Abnormal breast biopsy 09/01/2019   Statin myopathy 08/13/2019   Cataracts, both eyes 04/12/2019   Carpal tunnel syndrome of right wrist 12/26/2018   Type II diabetes mellitus with complication (HCC) 12/24/2017   Knee instability, left 12/24/2017   History of colonic polyps 12/16/2016   Essential hypertension 12/11/2016   Hyperlipidemia associated with type 2 diabetes mellitus (HCC) 12/11/2016   Gastroesophageal reflux disease 12/11/2016   Kidney stones 12/11/2016   Osteopenia 12/11/2016   Multinodular goiter 06/09/2016    Allergies  Allergen Reactions   Esomeprazole Magnesium Other (See Comments)    Other Reaction: GI UPSET   Rosuvastatin Other (See Comments)    Joint and muscle pain   Atorvastatin Other (See Comments)    Joint pain    Past Surgical History:  Procedure Laterality Date   ABDOMINAL HYSTERECTOMY     Partial - Still have ovaries. - Cyst on uterus    BREAST BIOPSY Left 07/20/2019   sclerosing lesion   BREAST BIOPSY Left 09/29/2019   NL-neg   breast lesion removed Left 2021   BREAST LUMPECTOMY WITH NEEDLE LOCALIZATION Left 09/29/2019   Procedure: BREAST LUMPECTOMY WITH  NEEDLE LOCALIZATION;  Surgeon: Henrene Dodge, MD;  Location: [facility] ORS;  Service: General;  Laterality: Left;   BREAST SURGERY     biopsy left breast   COLONOSCOPY  02/04/2018   one polyp -    DILATION AND CURETTAGE OF UTERUS     EYE SURGERY     cateract bilateral   fibroid biopsy     LIPOMA EXCISION Right 04/03/2021   Procedure: EXCISION LIPOMA, right supraclavicular & right shoulder lipomas x 2;  Surgeon: Henrene Dodge, MD;  Location: [facility] ORS;  Service: General;  Laterality: Right;  Right supraclavicular and right shoulder lipomas   TONSILLECTOMY      Social History   Tobacco Use   Smoking status: Never   Smokeless tobacco: Never  Vaping Use   Vaping status: Never Used  Substance Use Topics   Alcohol use: No   Drug use: No     Medication list has been reviewed and updated.  Current Meds  Medication Sig   acetaminophen (TYLENOL) 500 MG tablet Take 2 tablets (1,000 mg total) by mouth every 6 (six) hours as needed for mild pain.   aspirin EC 81 MG tablet Take 81 mg by mouth daily.   Cholecalciferol 25 MCG (1000 UT) tablet Take 1,000 Units by mouth daily.    docusate sodium (COLACE) 100 MG capsule Take 100 mg by mouth 2 (two) times  daily as needed.    ezetimibe (ZETIA) 10 MG tablet TAKE 1 TABLET BY MOUTH EVERY DAY   glucose blood (ONETOUCH ULTRA) test strip USE AS DIRECTED ONCE DAILY   Lancets (ONETOUCH DELICA PLUS LANCET30G) MISC USE AS DIRECTED ONCE DAILY   latanoprost (XALATAN) 0.005 % ophthalmic solution Place 1 drop into both eyes at bedtime.   lisinopril (ZESTRIL) 10 MG tablet TAKE 1 TABLET BY MOUTH TWICE A DAY   Omega-3 Fatty Acids (FISH OIL) 1000 MG CAPS Take 2,000 mg by mouth daily.    zinc gluconate 50 MG tablet Take 50 mg by mouth daily.       06/15/2023   10:37 AM 10/09/2022    8:07 AM 06/01/2022    8:30 AM 03/10/2022    1:30 PM  GAD 7 : Generalized Anxiety Score  Nervous, Anxious, on Edge 0 0 0 0  Control/stop worrying 0 0 0 0  Worry too much - different  things 0 0 0 0  Trouble relaxing 0 0 0 0  Restless 0 0 0 0  Easily annoyed or irritable 0 0 0 0  Afraid - awful might happen 0 0 0 0  Total GAD 7 Score 0 0 0 0  Anxiety Difficulty Not difficult at all Not difficult at all Not difficult at all Not difficult at all       06/15/2023   10:37 AM 10/09/2022    8:07 AM 07/15/2022    9:25 AM  Depression screen PHQ 2/9  Decreased Interest 0 0 0  Down, Depressed, Hopeless 0 0 0  PHQ - 2 Score 0 0 0  Altered sleeping 0 0 0  Tired, decreased energy 0 0 0  Change in appetite 0 0 0  Feeling bad or failure about yourself  0 0 0  Trouble concentrating 0 0 0  Moving slowly or fidgety/restless 0 0 0  Suicidal thoughts 0 0 0  PHQ-9 Score 0 0 0  Difficult doing work/chores Not difficult at all Not difficult at all Not difficult at all    BP Readings from Last 3 Encounters:  06/15/23 120/72  10/09/22 118/70  06/01/22 128/70    Physical Exam Vitals and nursing note reviewed.  Constitutional:      General: She is not in acute distress.    Appearance: She is well-developed.  HENT:     Head: Normocephalic and atraumatic.     Right Ear: Tympanic membrane and ear canal normal.     Left Ear: Tympanic membrane and ear canal normal.     Nose:     Right Sinus: No maxillary sinus tenderness.     Left Sinus: No maxillary sinus tenderness.  Eyes:     General: No scleral icterus.       Right eye: No discharge.        Left eye: No discharge.     Conjunctiva/sclera: Conjunctivae normal.  Neck:     Thyroid: No thyromegaly.     Vascular: No carotid bruit.  Cardiovascular:     Rate and Rhythm: Normal rate and regular rhythm.     Pulses: Normal pulses.     Heart sounds: Normal heart sounds.  Pulmonary:     Effort: Pulmonary effort is normal. No respiratory distress.     Breath sounds: No wheezing.  Abdominal:     General: Bowel sounds are normal.     Palpations: Abdomen is soft.     Tenderness: There is no abdominal tenderness.  Musculoskeletal:  Cervical back: Normal range of motion. No erythema.     Right lower leg: No edema.     Left lower leg: No edema.  Lymphadenopathy:     Cervical: No cervical adenopathy.  Skin:    General: Skin is warm and dry.     Findings: No rash.  Neurological:     Mental Status: She is alert and oriented to person, place, and time.     Cranial Nerves: No cranial nerve deficit.     Sensory: No sensory deficit.     Deep Tendon Reflexes: Reflexes are normal and symmetric.  Psychiatric:        Attention and Perception: Attention normal.        Mood and Affect: Mood normal.    Diabetic Foot Exam - Simple   Simple Foot Form Diabetic Foot exam was performed with the following findings: Yes 06/15/2023 10:57 AM  Visual Inspection No deformities, no ulcerations, no other skin breakdown bilaterally: Yes Sensation Testing Intact to touch and monofilament testing bilaterally: Yes Pulse Check Posterior Tibialis and Dorsalis pulse intact bilaterally: Yes Comments      Wt Readings from Last 3 Encounters:  06/15/23 180 lb (81.6 kg)  10/09/22 179 lb (81.2 kg)  07/15/22 177 lb 9.6 oz (80.6 kg)    BP 120/72   Pulse 68   Ht 5\' 3"  (1.6 m)   Wt 180 lb (81.6 kg)   SpO2 96%   BMI 31.89 kg/m   Assessment and Plan:  Problem List Items Addressed This Visit       Unprioritized   Essential hypertension (Chronic)    Controlled BP with normal exam. Current regimen is lisinopril. Will continue same medications; encourage continued reduced sodium diet.       Relevant Orders   CBC with Differential/Platelet   Comprehensive metabolic panel   Hyperlipidemia associated with type 2 diabetes mellitus (HCC) (Chronic)    LDL is  Lab Results  Component Value Date   LDLCALC 87 06/01/2022    Current regimen is zetia.  Tolerating medications well without issues.       Relevant Orders   Lipid panel   Type II diabetes mellitus with complication (HCC) (Chronic)    Blood sugars stable without  hypoglycemic symptoms or events. FSBS ~ 130's, no low readings. Currently managed with diet. Changes made last visit are none. Lab Results  Component Value Date   HGBA1C 7.0 (H) 10/09/2022         Relevant Orders   Comprehensive metabolic panel   Hemoglobin A1c   TSH   Microalbumin / creatinine urine ratio   Other Visit Diagnoses     Annual physical exam    -  Primary   Normal exam for age. up to date on screenings and immunizations   Encounter for screening mammogram for breast cancer       scheduled       Return in about 4 months (around 10/13/2023) for HTN, DM.    Reubin Milan, MD [facility] Health Primary Care and Sports Medicine [place]

## 2023-06-16 LAB — HEMOGLOBIN A1C
Est. average glucose Bld gHb Est-mCnc: 163 mg/dL
Hgb A1c MFr Bld: 7.3 % — ABNORMAL HIGH (ref 4.8–5.6)

## 2023-06-16 LAB — CBC WITH DIFFERENTIAL/PLATELET
Basophils Absolute: 0 10*3/uL (ref 0.0–0.2)
Basos: 1 %
EOS (ABSOLUTE): 0.2 10*3/uL (ref 0.0–0.4)
Eos: 3 %
Hematocrit: 36.8 % (ref 34.0–46.6)
Hemoglobin: 11.9 g/dL (ref 11.1–15.9)
Immature Grans (Abs): 0 10*3/uL (ref 0.0–0.1)
Immature Granulocytes: 0 %
Lymphocytes Absolute: 2.8 10*3/uL (ref 0.7–3.1)
Lymphs: 50 %
MCH: 28.8 pg (ref 26.6–33.0)
MCHC: 32.3 g/dL (ref 31.5–35.7)
MCV: 89 fL (ref 79–97)
Monocytes Absolute: 0.5 10*3/uL (ref 0.1–0.9)
Monocytes: 9 %
Neutrophils Absolute: 2.1 10*3/uL (ref 1.4–7.0)
Neutrophils: 37 %
Platelets: 271 10*3/uL (ref 150–450)
RBC: 4.13 x10E6/uL (ref 3.77–5.28)
RDW: 12 % (ref 11.7–15.4)
WBC: 5.6 10*3/uL (ref 3.4–10.8)

## 2023-06-16 LAB — COMPREHENSIVE METABOLIC PANEL
ALT: 26 [IU]/L (ref 0–32)
AST: 23 [IU]/L (ref 0–40)
Albumin: 4.6 g/dL (ref 3.8–4.8)
Alkaline Phosphatase: 90 [IU]/L (ref 44–121)
BUN/Creatinine Ratio: 16 (ref 12–28)
BUN: 16 mg/dL (ref 8–27)
Bilirubin Total: 0.4 mg/dL (ref 0.0–1.2)
CO2: 23 mmol/L (ref 20–29)
Calcium: 9.9 mg/dL (ref 8.7–10.3)
Chloride: 102 mmol/L (ref 96–106)
Creatinine, Ser: 0.97 mg/dL (ref 0.57–1.00)
Globulin, Total: 2.7 g/dL (ref 1.5–4.5)
Glucose: 113 mg/dL — ABNORMAL HIGH (ref 70–99)
Potassium: 4.2 mmol/L (ref 3.5–5.2)
Sodium: 138 mmol/L (ref 134–144)
Total Protein: 7.3 g/dL (ref 6.0–8.5)
eGFR: 59 mL/min/{1.73_m2} — ABNORMAL LOW (ref >59)

## 2023-06-16 LAB — MICROALBUMIN / CREATININE URINE RATIO
Creatinine, Urine: 101.5 mg/dL
Microalb/Creat Ratio: 3 mg/g{creat} (ref 0–29)
Microalbumin, Urine: 3 ug/mL

## 2023-06-16 LAB — TSH: TSH: 1.31 u[IU]/mL (ref 0.450–4.500)

## 2023-06-16 LAB — LIPID PANEL
Chol/HDL Ratio: 2.3 ratio (ref 0.0–4.4)
Cholesterol, Total: 217 mg/dL — ABNORMAL HIGH (ref 100–199)
HDL: 93 mg/dL (ref >39)
LDL Chol Calc (NIH): 107 mg/dL — ABNORMAL HIGH (ref 0–99)
Triglycerides: 97 mg/dL (ref 0–149)
VLDL Cholesterol Cal: 17 mg/dL (ref 5–40)

## 2023-06-24 ENCOUNTER — Ambulatory Visit
Admission: RE | Admit: 2023-06-24 | Discharge: 2023-06-24 | Disposition: A | Discharge status: Home / Self Care | Payer: Medicare Other | Source: Ambulatory Visit | Attending: Internal Medicine | Admitting: Internal Medicine

## 2023-06-24 DIAGNOSIS — Z1231 Encounter for screening mammogram for malignant neoplasm of breast: Secondary | ICD-10-CM | POA: Diagnosis not present

## 2023-06-25 ENCOUNTER — Other Ambulatory Visit: Payer: Self-pay | Admitting: Internal Medicine

## 2023-06-25 DIAGNOSIS — R921 Mammographic calcification found on diagnostic imaging of breast: Secondary | ICD-10-CM

## 2023-06-25 DIAGNOSIS — R928 Other abnormal and inconclusive findings on diagnostic imaging of breast: Secondary | ICD-10-CM

## 2023-07-01 ENCOUNTER — Ambulatory Visit
Admission: RE | Admit: 2023-07-01 | Discharge: 2023-07-01 | Disposition: A | Discharge status: Home / Self Care | Payer: Medicare Other | Source: Ambulatory Visit | Attending: Internal Medicine | Admitting: Internal Medicine

## 2023-07-01 DIAGNOSIS — R928 Other abnormal and inconclusive findings on diagnostic imaging of breast: Secondary | ICD-10-CM | POA: Diagnosis not present

## 2023-07-01 DIAGNOSIS — R921 Mammographic calcification found on diagnostic imaging of breast: Secondary | ICD-10-CM | POA: Diagnosis not present

## 2023-07-01 DIAGNOSIS — R92321 Mammographic fibroglandular density, right breast: Secondary | ICD-10-CM | POA: Diagnosis not present

## 2023-07-15 ENCOUNTER — Other Ambulatory Visit: Payer: Self-pay | Admitting: Internal Medicine

## 2023-07-16 NOTE — Telephone Encounter (Signed)
Requested Prescriptions  Pending Prescriptions Disp Refills   glucose blood (ONETOUCH ULTRA) test strip [Pharmacy Med Name: ONE TOUCH ULTRA BLUE TEST STRP] 100 strip 3    Sig: USE AS DIRECTED ONCE DAILY     Endocrinology: Diabetes - Testing Supplies Passed - 07/15/2023 12:56 AM      Passed - Valid encounter within last 12 months    Recent Outpatient Visits           1 month ago Annual physical exam   [place] [facility] at [facility], Nyoka Cowden, MD   9 months ago Essential hypertension   [place] [facility] at [facility], Nyoka Cowden, MD   1 year ago Annual physical exam   [facility] Primary Care & [facility] Medicine at [facility], Nyoka Cowden, MD   1 year ago COVID-19   [facility] Primary Care & [facility] at [facility], Ocie Bob, MD   1 year ago Type II diabetes mellitus with complication [facility])   [place] [facility] Medicine at [facility], Nyoka Cowden, MD       Future Appointments             In 2 months Judithann Graves, Nyoka Cowden, MD [facility] Primary Care & Sports Medicine at [facility], [facility]

## 2023-07-20 DIAGNOSIS — H401131 Primary open-angle glaucoma, bilateral, mild stage: Secondary | ICD-10-CM | POA: Diagnosis not present

## 2023-07-20 DIAGNOSIS — H26493 Other secondary cataract, bilateral: Secondary | ICD-10-CM | POA: Diagnosis not present

## 2023-07-20 DIAGNOSIS — H40053 Ocular hypertension, bilateral: Secondary | ICD-10-CM | POA: Diagnosis not present

## 2023-07-20 DIAGNOSIS — Z961 Presence of intraocular lens: Secondary | ICD-10-CM | POA: Diagnosis not present

## 2023-07-20 DIAGNOSIS — Z7984 Long term (current) use of oral hypoglycemic drugs: Secondary | ICD-10-CM | POA: Diagnosis not present

## 2023-07-20 DIAGNOSIS — R7309 Other abnormal glucose: Secondary | ICD-10-CM | POA: Diagnosis not present

## 2023-07-28 ENCOUNTER — Ambulatory Visit: Payer: Medicare Other

## 2023-07-28 VITALS — BP 130/76 | Ht 63.0 in | Wt 187.6 lb

## 2023-07-28 DIAGNOSIS — Z0000 Encounter for general adult medical examination without abnormal findings: Secondary | ICD-10-CM | POA: Diagnosis not present

## 2023-07-28 NOTE — Progress Notes (Signed)
Subjective:   Dawn Fields is a 77 y.o. female who presents for Medicare Annual (Subsequent) preventive examination.  Visit Complete: In person    Cardiac Risk Factors include: advanced age (>20men, >66 women);diabetes mellitus;dyslipidemia;hypertension;obesity (BMI >30kg/m2)     Objective:    Today's Vitals   07/28/23 1459 07/28/23 1502  BP: 130/76   Weight: 187 lb 9.6 oz (85.1 kg)   Height: 5\' 3"  (1.6 m)   PainSc:  0-No pain   Body mass index is 33.23 kg/m.     07/28/2023    3:12 PM 07/15/2022    9:25 AM 07/14/2021   10:09 AM 04/03/2021   10:29 AM 03/27/2021    8:34 AM 07/10/2020    8:56 AM 09/19/2019   12:36 PM  Advanced Directives  Does Patient Have a Medical Advance Directive? No Yes Yes Yes Yes Yes Yes  Type of Advance Directive  Living will Healthcare Power of [place];Living will Healthcare Power of [place];Living will  Healthcare Power of [place];Living will Healthcare Power of [place];Living will  Does patient want to make changes to medical advance directive?    No - Patient declined   No - Patient declined  Copy of Healthcare Power of Attorney in Chart?   No - copy requested No - copy requested  No - copy requested No - copy requested  Would patient like information on creating a medical advance directive? No - Patient declined          Current Medications (verified) Outpatient Encounter Medications as of 07/28/2023  Medication Sig   acetaminophen (TYLENOL) 500 MG tablet Take 2 tablets (1,000 mg total) by mouth every 6 (six) hours as needed for mild pain.   aspirin EC 81 MG tablet Take 81 mg by mouth daily.   Cholecalciferol 25 MCG (1000 UT) tablet Take 1,000 Units by mouth daily.    docusate sodium (COLACE) 100 MG capsule Take 100 mg by mouth 2 (two) times daily as needed.    ezetimibe (ZETIA) 10 MG tablet TAKE 1 TABLET BY MOUTH EVERY DAY   glucose blood (ONETOUCH ULTRA) test strip USE AS DIRECTED ONCE DAILY   Lancets (ONETOUCH DELICA PLUS LANCET30G) MISC  USE AS DIRECTED ONCE DAILY   latanoprost (XALATAN) 0.005 % ophthalmic solution Place 1 drop into both eyes at bedtime.   lisinopril (ZESTRIL) 10 MG tablet TAKE 1 TABLET BY MOUTH TWICE A DAY   Omega-3 Fatty Acids (FISH OIL) 1000 MG CAPS Take 2,000 mg by mouth daily.    zinc gluconate 50 MG tablet Take 50 mg by mouth daily.   No facility-administered encounter medications on file as of 07/28/2023.    Allergies (verified) Esomeprazole magnesium, Rosuvastatin, and Atorvastatin   History: Past Medical History:  Diagnosis Date   Allergy Pollen   Carpal tunnel syndrome of right wrist    Cataract Surgery Pending   GERD (gastroesophageal reflux disease)    History of kidney stones    Hyperlipidemia    Hypertension    Kidney stones    Multinodular goiter    Type II diabetes mellitus with complication (HCC)    Past Surgical History:  Procedure Laterality Date   ABDOMINAL HYSTERECTOMY     Partial - Still have ovaries. - Cyst on uterus    BREAST BIOPSY Left 07/20/2019   sclerosing lesion   BREAST BIOPSY Left 09/29/2019   NL-neg   breast lesion removed Left 2021   BREAST LUMPECTOMY WITH NEEDLE LOCALIZATION Left 09/29/2019   Procedure: BREAST LUMPECTOMY WITH NEEDLE LOCALIZATION;  Surgeon: Henrene Dodge, MD;  Location: [facility] ORS;  Service: General;  Laterality: Left;   BREAST SURGERY     biopsy left breast   COLONOSCOPY  02/04/2018   one polyp -    DILATION AND CURETTAGE OF UTERUS     EYE SURGERY     cateract bilateral   fibroid biopsy     LIPOMA EXCISION Right 04/03/2021   Procedure: EXCISION LIPOMA, right supraclavicular & right shoulder lipomas x 2;  Surgeon: Henrene Dodge, MD;  Location: [facility] ORS;  Service: General;  Laterality: Right;  Right supraclavicular and right shoulder lipomas   TONSILLECTOMY     Family History  Problem Relation Age of Onset   Stroke Mother    Hypertension Mother    Liver cancer Father    Cancer Father    COPD Sister    Diabetes Sister    Stroke  Sister    Diabetes Brother    Stroke Brother    Hearing loss Brother    Diabetes Maternal Grandmother    Diabetes Maternal Grandfather    Diabetes Sister    Mental illness Sister    Breast cancer Maternal Aunt    Breast cancer Cousin        mat cousin   Social History   Socioeconomic History   Marital status: Single    Spouse name: Not on file   Number of children: 1   Years of education: Not on file   Highest education level: Some college, no degree  Occupational History   Occupation: retired    Comment: Marine scientist  Tobacco Use   Smoking status: Never   Smokeless tobacco: Never  Vaping Use   Vaping status: Never Used  Substance and Sexual Activity   Alcohol use: No   Drug use: No   Sexual activity: Not Currently    Birth control/protection: Other-see comments    Comment: Age  Other Topics Concern   Not on file  Social History Narrative   Lives alone   Social Drivers of Health   Financial Resource Strain: Low Risk  (07/28/2023)   Overall Financial Resource Strain (CARDIA)    Difficulty of Paying Living Expenses: Not very hard  Food Insecurity: No Food Insecurity (07/28/2023)   Hunger Vital Sign    Worried About Running Out of Food in the Last Year: Never true    Ran Out of Food in the Last Year: Never true  Transportation Needs: No Transportation Needs (07/28/2023)   PRAPARE - Administrator, Civil Service (Medical): No    Lack of Transportation (Non-Medical): No  Physical Activity: Insufficiently Active (07/28/2023)   Exercise Vital Sign    Days of Exercise per Week: 3 days    Minutes of Exercise per Session: 30 min  Stress: No Stress Concern Present (07/28/2023)   [employer] of Occupational Health - Occupational Stress Questionnaire    Feeling of Stress : Only a little  Social Connections: Socially Isolated (07/28/2023)   Social Connection and Isolation Panel [NHANES]    Frequency of Communication with Friends and Family:  More than three times a week    Frequency of Social Gatherings with Friends and Family: Never    Attends Religious Services: Never    Database administrator or Organizations: No    Attends Banker Meetings: Never    Marital Status: Never married    Tobacco Counseling Counseling given: Not Answered   Clinical Intake:  Pre-visit preparation completed: Yes  Pain :  No/denies pain Pain Score: 0-No pain     BMI - recorded: 33.23 Nutritional Status: BMI > 30  Obese Nutritional Risks: None Diabetes: No  How often do you need to have someone help you when you read instructions, pamphlets, or other written materials from your doctor or pharmacy?: 1 - Never  Interpreter Needed?: No  Information entered by :: Kennedy Bucker, LPN   Activities of Daily Living    07/28/2023    3:13 PM 07/26/2023    3:32 PM  In your present state of health, do you have any difficulty performing the following activities:  Hearing? 0 0  Vision? 0 0  Difficulty concentrating or making decisions? 0 0  Walking or climbing stairs? 0 0  Dressing or bathing? 0 0  Doing errands, shopping? 0 0  Preparing Food and eating ? N N  Using the Toilet? N N  In the past six months, have you accidently leaked urine? Y Y  Do you have problems with loss of bowel control? N N  Managing your Medications? N N  Managing your Finances? N N  Housekeeping or managing your Housekeeping? N N    Patient Care Team: Reubin Milan, MD as PCP - General (Internal Medicine) Vernie Murders, MD (Otolaryngology) Cleda Clarks, MD as Referring Physician (Gastroenterology) Dierdre Searles, MD as Referring Physician (Cardiology) Fields Madeira, OD (Optometry)  Indicate any recent Medical Services you may have received from other than [facility] providers in the past year (date may be approximate).     Assessment:   This is a routine wellness examination for [facility].  Hearing/Vision screen Hearing  Screening - Comments:: NO AIDS Vision Screening - Comments:: BIFOCALS- HAD CATARACT SGY- DR.NICE   Goals Addressed             This Visit's Progress    DIET - INCREASE WATER INTAKE         Depression Screen    07/28/2023    3:06 PM 06/15/2023   10:37 AM 10/09/2022    8:07 AM 07/15/2022    9:25 AM 06/01/2022    8:30 AM 03/10/2022    1:30 PM 02/25/2022   11:21 AM  PHQ 2/9 Scores  PHQ - 2 Score 0 0 0 0 0 0 1  PHQ- 9 Score 0 0 0 0 1 1 2     Fall Risk    07/28/2023    3:13 PM 07/26/2023    3:32 PM 06/15/2023   10:36 AM 10/09/2022    8:07 AM 07/15/2022    9:25 AM  Fall Risk   Falls in the past year? 0 0 0 0 1  Number falls in past yr: 0 0 0 0 1  Injury with Fall? 0 0 0 0 0  Risk for fall due to : No Fall Risks  No Fall Risks No Fall Risks History of fall(s)  Follow up Falls prevention discussed;Falls evaluation completed  Falls evaluation completed Falls evaluation completed Falls evaluation completed    MEDICARE RISK AT HOME: Medicare Risk at Home Any stairs in or around the home?: Yes If so, are there any without handrails?: No Home free of loose throw rugs in walkways, pet beds, electrical cords, etc?: Yes Adequate lighting in your home to reduce risk of falls?: Yes Life alert?: No Use of a cane, walker or w/c?: No Grab bars in the bathroom?: No Shower chair or bench in shower?: No Elevated toilet seat or a handicapped toilet?: Yes  TIMED UP AND GO:  Was the test performed?  Yes  Length of time to ambulate 10 feet: 4 sec Gait steady and fast without use of assistive device    Cognitive Function:        07/28/2023    3:18 PM 07/15/2022    9:26 AM 07/10/2019    9:04 AM 06/23/2017    8:40 AM  6CIT Screen  What Year? 0 points 0 points 0 points 0 points  What month? 0 points 0 points 0 points 0 points  What time? 0 points 0 points 0 points 0 points  Count back from 20 0 points 0 points 0 points 0 points  Months in reverse 0 points 0 points 0 points 0 points   Repeat phrase 0 points 0 points 0 points 0 points  Total Score 0 points 0 points 0 points 0 points    Immunizations Immunization History  Administered Date(s) Administered   Fluad Quad(high Dose 65+) 06/01/2022   Influenza, High Dose Seasonal PF 05/09/2018, 03/29/2019, 04/17/2020, 05/07/2021, 04/20/2023   Influenza, Seasonal, Injecte, Preservative Fre 05/09/2014   Influenza,inj,Quad PF,6+ Mos 06/02/2016   Influenza-Unspecified 04/08/2017, 04/30/2018, 03/31/2019   Moderna Covid-19 Fall Seasonal Vaccine 12yrs & older 04/20/2023   PFIZER Comirnaty(Gray Top)Covid-19 Tri-Sucrose Vaccine 11/08/2020   PFIZER(Purple Top)SARS-COV-2 Vaccination 08/16/2019, 09/06/2019, 05/06/2020   Pfizer Covid-19 Vaccine Bivalent Booster 50yrs & up 05/07/2021   Pfizer(Comirnaty)Fall Seasonal Vaccine 12 years and older 06/10/2022   Pneumococcal Conjugate-13 11/06/2013   Pneumococcal Polysaccharide-23 08/10/2010   Tdap 03/07/2010, 11/19/2021   Zoster Recombinant(Shingrix) 05/24/2018, 07/25/2018   Zoster, Live 11/02/2012    TDAP status: Up to date  Flu Vaccine status: Up to date  Pneumococcal vaccine status: Declined,  Education has been provided regarding the importance of this vaccine but patient still declined. Advised may receive this vaccine at local pharmacy or Health Dept. Aware to provide a copy of the vaccination record if obtained from local pharmacy or Health Dept. Verbalized acceptance and understanding.   Covid-19 vaccine status: Completed vaccines  Qualifies for Shingles Vaccine? Yes   Zostavax completed Yes   Shingrix Completed?: Yes  Screening Tests Health Maintenance  Topic Date Due   HEMOGLOBIN A1C  12/13/2023   OPHTHALMOLOGY EXAM  03/28/2024   Diabetic kidney evaluation - eGFR measurement  06/14/2024   Diabetic kidney evaluation - Urine ACR  06/14/2024   FOOT EXAM  06/14/2024   MAMMOGRAM  06/23/2024   Medicare Annual Wellness (AWV)  07/27/2024   Colonoscopy  05/19/2028    DTaP/Tdap/Td (3 - Td or Tdap) 11/20/2031   Pneumonia Vaccine 77+ Years old  Completed   INFLUENZA VACCINE  Completed   DEXA SCAN  Completed   COVID-19 Vaccine  Completed   Hepatitis C Screening  Completed   Zoster Vaccines- Shingrix  Completed   HPV VACCINES  Aged Out    Health Maintenance  There are no preventive care reminders to display for this patient.   Colorectal cancer screening: Type of screening: Colonoscopy. Completed 05/20/23. Repeat every 5 years  Mammogram status: Completed 06/24/23. Repeat every year  Bone Density status: Completed 08/28/20. Results reflect: Bone density results: OSTEOPENIA. Repeat every 3-5 years.  Lung Cancer Screening: (Low Dose CT Chest recommended if Age 34-80 years, 20 pack-year currently smoking OR have quit w/in 15years.) does not qualify.    Additional Screening:  Hepatitis C Screening: does qualify; Completed 12/04/13  Vision Screening: Recommended annual ophthalmology exams for early detection of glaucoma and other disorders of the eye. Is the patient up to date with  their annual eye exam?  Yes  Who is the provider or what is the name of the office in which the patient attends annual eye exams? DR.NICE If pt is not established with a provider, would they like to be referred to a provider to establish care? No .   Dental Screening: Recommended annual dental exams for proper oral hygiene  Diabetic Foot Exam: Diabetic Foot Exam: Completed 06/15/23  Community Resource Referral / Chronic Care Management: CRR required this visit?  No   CCM required this visit?  No     Plan:     I have personally reviewed and noted the following in the patient's chart:   Medical and social history Use of alcohol, tobacco or illicit drugs  Current medications and supplements including opioid prescriptions. Patient is not currently taking opioid prescriptions. Functional ability and status Nutritional status Physical activity Advanced  directives List of other physicians Hospitalizations, surgeries, and ER visits in previous 12 months Vitals Screenings to include cognitive, depression, and falls Referrals and appointments  In addition, I have reviewed and discussed with patient certain preventive protocols, quality metrics, and best practice recommendations. A written personalized care plan for preventive services as well as general preventive health recommendations were provided to patient.     Hal Hope, LPN   19/14/7829   After Visit Summary: (In Person-Declined) Patient declined AVS at this time.  Nurse Notes: NONE

## 2023-07-28 NOTE — Patient Instructions (Addendum)
Dawn Fields , Thank you for taking time to come for your Medicare Wellness Visit. I appreciate your ongoing commitment to your health goals. Please review the following plan we discussed and let me know if I can assist you in the future.   Referrals/Orders/Follow-Ups/Clinician Recommendations: NONE   This is a list of the screening recommended for you and due dates:  Health Maintenance  Topic Date Due   Hemoglobin A1C  12/13/2023   Eye exam for diabetics  03/28/2024   Yearly kidney function blood test for diabetes  06/14/2024   Yearly kidney health urinalysis for diabetes  06/14/2024   Complete foot exam   06/14/2024   Mammogram  06/23/2024   Medicare Annual Wellness Visit  07/27/2024   Colon Cancer Screening  05/19/2028   DTaP/Tdap/Td vaccine (3 - Td or Tdap) 11/20/2031   Pneumonia Vaccine  Completed   Flu Shot  Completed   DEXA scan (bone density measurement)  Completed   COVID-19 Vaccine  Completed   Hepatitis C Screening  Completed   Zoster (Shingles) Vaccine  Completed   HPV Vaccine  Aged Out    Advanced directives: (ACP Link)Information on Advanced Care Planning can be found at [facility] [place] Advance Health Care Directives Advance Health Care Directives ([email or web address]   Next Medicare Annual Wellness Visit scheduled for next year: Yes   08/09/24 @ 1:20 PM IN PERSON

## 2023-08-10 ENCOUNTER — Other Ambulatory Visit: Payer: Self-pay | Admitting: Internal Medicine

## 2023-08-10 DIAGNOSIS — I10 Essential (primary) hypertension: Secondary | ICD-10-CM

## 2023-08-13 NOTE — Telephone Encounter (Signed)
 Requested Prescriptions  Pending Prescriptions Disp Refills   lisinopril  (ZESTRIL ) 10 MG tablet [Pharmacy Med Name: LISINOPRIL  10 MG TABLET] 180 tablet 0    Sig: TAKE 1 TABLET BY MOUTH TWICE A DAY     Cardiovascular:  ACE Inhibitors Passed - 08/13/2023  4:36 PM      Passed - Cr in normal range and within 180 days    Creatinine, Ser  Date Value Ref Range Status  06/15/2023 0.97 0.57 - 1.00 mg/dL Final         Passed - K in normal range and within 180 days    Potassium  Date Value Ref Range Status  06/15/2023 4.2 3.5 - 5.2 mmol/L Final         Passed - Patient is not pregnant      Passed - Last BP in normal range    BP Readings from Last 1 Encounters:  07/28/23 130/76         Passed - Valid encounter within last 6 months    Recent Outpatient Visits           1 month ago Annual physical exam   [place] [facility], Leita DEL, MD   10 months ago Essential hypertension   [place] [facility], Leita DEL, MD   1 year ago Annual physical exam   [facility] at [facility], Leita DEL, MD   1 year ago COVID-19   [facility] at [facility], Selinda PARAS, MD   1 year ago Type II diabetes mellitus with complication [facility])   [place] [facility] at [facility], Leita DEL, MD       Future Appointments             In 1 month Adie, Leita DEL, MD [facility] at [facility]

## 2023-10-06 ENCOUNTER — Encounter: Payer: Self-pay | Admitting: Internal Medicine

## 2023-10-06 ENCOUNTER — Ambulatory Visit (INDEPENDENT_AMBULATORY_CARE_PROVIDER_SITE_OTHER): Payer: Medicare Other | Admitting: Internal Medicine

## 2023-10-06 VITALS — BP 128/70 | HR 84 | Temp 98.1°F | Ht 63.0 in | Wt 184.4 lb

## 2023-10-06 DIAGNOSIS — E785 Hyperlipidemia, unspecified: Secondary | ICD-10-CM

## 2023-10-06 DIAGNOSIS — J0110 Acute frontal sinusitis, unspecified: Secondary | ICD-10-CM | POA: Diagnosis not present

## 2023-10-06 DIAGNOSIS — G720 Drug-induced myopathy: Secondary | ICD-10-CM | POA: Diagnosis not present

## 2023-10-06 DIAGNOSIS — I10 Essential (primary) hypertension: Secondary | ICD-10-CM

## 2023-10-06 DIAGNOSIS — E118 Type 2 diabetes mellitus with unspecified complications: Secondary | ICD-10-CM | POA: Diagnosis not present

## 2023-10-06 DIAGNOSIS — E1169 Type 2 diabetes mellitus with other specified complication: Secondary | ICD-10-CM | POA: Diagnosis not present

## 2023-10-06 LAB — POCT GLYCOSYLATED HEMOGLOBIN (HGB A1C): Hemoglobin A1C: 6.7 % — AB (ref 4.0–5.6)

## 2023-10-06 MED ORDER — AZITHROMYCIN 250 MG PO TABS: ORAL_TABLET | ORAL | 0 refills | Status: AC

## 2023-10-06 NOTE — Assessment & Plan Note (Addendum)
 Diabetes managed with diet only. Lab Results  Component Value Date   HGBA1C 7.3 (H) 06/15/2023  A1C today = 6.7

## 2023-10-06 NOTE — Assessment & Plan Note (Signed)
 Unable to tolerated statins On Zetia Lab Results  Component Value Date   LDLCALC 107 (H) 06/15/2023

## 2023-10-06 NOTE — Assessment & Plan Note (Signed)
 Controlled BP with normal exam. Current regimen is lisinopril. Will continue same medications; encourage continued reduced sodium diet.

## 2023-10-06 NOTE — Progress Notes (Signed)
 Date:  10/06/2023   Name:  Dawn Fields   DOB:  Dec 01, 1945   MRN:  [id number]   Chief Complaint: Diabetes, Hypertension, and Cough (X 2.5 weeks, nasal drainage, no pain, no SOB, no fever)  Hypertension This is a chronic problem. The problem is controlled. Pertinent negatives include no chest pain, headaches, palpitations or shortness of breath. Past treatments include ACE inhibitors. The current treatment provides significant improvement. There is no history of CAD/MI or CVA.  Hyperlipidemia This is a chronic problem. Recent lipid tests were reviewed and are high. Pertinent negatives include no chest pain or shortness of breath. Current antihyperlipidemic treatment includes ezetimibe. The current treatment provides moderate improvement of lipids.  Diabetes She presents for her follow-up diabetic visit. She has type 2 diabetes mellitus. Pertinent negatives for hypoglycemia include no headaches or tremors. Pertinent negatives for diabetes include no chest pain, no fatigue, no polydipsia and no polyuria. Pertinent negatives for diabetic complications include no CVA. Current diabetic treatment includes diet.    Review of Systems  Constitutional:  Negative for appetite change, fatigue, fever and unexpected weight change.  HENT:  Negative for tinnitus and trouble swallowing.   Eyes:  Negative for visual disturbance.  Respiratory:  Negative for cough, chest tightness and shortness of breath.   Cardiovascular:  Negative for chest pain, palpitations and leg swelling.  Gastrointestinal:  Negative for abdominal pain.  Endocrine: Negative for polydipsia and polyuria.  Genitourinary:  Negative for dysuria and hematuria.  Musculoskeletal:  Negative for arthralgias.  Neurological:  Negative for tremors, numbness and headaches.  Psychiatric/Behavioral:  Negative for dysphoric mood.      Lab Results  Component Value Date   NA 138 06/15/2023   K 4.2 06/15/2023   CO2 23 06/15/2023   GLUCOSE 113  (H) 06/15/2023   BUN 16 06/15/2023   CREATININE 0.97 06/15/2023   CALCIUM 9.9 06/15/2023   EGFR 59 (L) 06/15/2023   GFRNONAA >60 03/28/2021   Lab Results  Component Value Date   CHOL 217 (H) 06/15/2023   HDL 93 06/15/2023   LDLCALC 107 (H) 06/15/2023   TRIG 97 06/15/2023   CHOLHDL 2.3 06/15/2023   Lab Results  Component Value Date   TSH 1.310 06/15/2023   Lab Results  Component Value Date   HGBA1C 6.7 (A) 10/06/2023   Lab Results  Component Value Date   WBC 5.6 06/15/2023   HGB 11.9 06/15/2023   HCT 36.8 06/15/2023   MCV 89 06/15/2023   PLT 271 06/15/2023   Lab Results  Component Value Date   ALT 26 06/15/2023   AST 23 06/15/2023   ALKPHOS 90 06/15/2023   BILITOT 0.4 06/15/2023   No results found for: "25OHVITD2", "25OHVITD3", "VD25OH"   Patient Active Problem List   Diagnosis Date Noted   Lipoma of right shoulder    Lipoma of neck 09/26/2020   Lipoma of anterior chest wall 01/04/2020   Radial scar of left breast    Abnormal breast biopsy 09/01/2019   Statin myopathy 08/13/2019   Cataracts, both eyes 04/12/2019   Carpal tunnel syndrome of right wrist 12/26/2018   Type II diabetes mellitus with complication (HCC) 12/24/2017   Knee instability, left 12/24/2017   History of colonic polyps 12/16/2016   Essential hypertension 12/11/2016   Hyperlipidemia associated with type 2 diabetes mellitus (HCC) 12/11/2016   Gastroesophageal reflux disease 12/11/2016   Kidney stones 12/11/2016   Osteopenia 12/11/2016   Multinodular goiter 06/09/2016    Allergies  Allergen Reactions  Esomeprazole Magnesium Other (See Comments)    Other Reaction: GI UPSET   Rosuvastatin Other (See Comments)    Joint and muscle pain   Atorvastatin Other (See Comments)    Joint pain    Past Surgical History:  Procedure Laterality Date   ABDOMINAL HYSTERECTOMY     Partial - Still have ovaries. - Cyst on uterus    BREAST BIOPSY Left 07/20/2019   sclerosing lesion   BREAST BIOPSY  Left 09/29/2019   NL-neg   breast lesion removed Left 2021   BREAST LUMPECTOMY WITH NEEDLE LOCALIZATION Left 09/29/2019   Procedure: BREAST LUMPECTOMY WITH NEEDLE LOCALIZATION;  Surgeon: Henrene Dodge, MD;  Location: [facility] ORS;  Service: General;  Laterality: Left;   BREAST SURGERY     biopsy left breast   COLONOSCOPY  02/04/2018   one polyp -    DILATION AND CURETTAGE OF UTERUS     EYE SURGERY     cateract bilateral   fibroid biopsy     LIPOMA EXCISION Right 04/03/2021   Procedure: EXCISION LIPOMA, right supraclavicular & right shoulder lipomas x 2;  Surgeon: Henrene Dodge, MD;  Location: [facility] ORS;  Service: General;  Laterality: Right;  Right supraclavicular and right shoulder lipomas   TONSILLECTOMY      Social History   Tobacco Use   Smoking status: Never   Smokeless tobacco: Never  Vaping Use   Vaping status: Never Used  Substance Use Topics   Alcohol use: No   Drug use: No     Medication list has been reviewed and updated.  Current Meds  Medication Sig   acetaminophen (TYLENOL) 500 MG tablet Take 2 tablets (1,000 mg total) by mouth every 6 (six) hours as needed for mild pain.   aspirin EC 81 MG tablet Take 81 mg by mouth daily.   azithromycin (ZITHROMAX Z-PAK) 250 MG tablet UAD   Cholecalciferol 25 MCG (1000 UT) tablet Take 1,000 Units by mouth daily.    docusate sodium (COLACE) 100 MG capsule Take 100 mg by mouth 2 (two) times daily as needed.    ezetimibe (ZETIA) 10 MG tablet TAKE 1 TABLET BY MOUTH EVERY DAY   glucose blood (ONETOUCH ULTRA) test strip USE AS DIRECTED ONCE DAILY   Lancets (ONETOUCH DELICA PLUS LANCET30G) MISC USE AS DIRECTED ONCE DAILY   latanoprost (XALATAN) 0.005 % ophthalmic solution Place 1 drop into both eyes at bedtime.   lisinopril (ZESTRIL) 10 MG tablet TAKE 1 TABLET BY MOUTH TWICE A DAY   Omega-3 Fatty Acids (FISH OIL) 1000 MG CAPS Take 2,000 mg by mouth daily.    zinc gluconate 50 MG tablet Take 50 mg by mouth daily.       10/06/2023     8:56 AM 06/15/2023   10:37 AM 10/09/2022    8:07 AM 06/01/2022    8:30 AM  GAD 7 : Generalized Anxiety Score  Nervous, Anxious, on Edge 0 0 0 0  Control/stop worrying 0 0 0 0  Worry too much - different things 0 0 0 0  Trouble relaxing 0 0 0 0  Restless 0 0 0 0  Easily annoyed or irritable 0 0 0 0  Afraid - awful might happen 0 0 0 0  Total GAD 7 Score 0 0 0 0  Anxiety Difficulty Not difficult at all Not difficult at all Not difficult at all Not difficult at all       10/06/2023    8:56 AM 07/28/2023    3:06 PM 06/15/2023  10:37 AM  Depression screen PHQ 2/9  Decreased Interest 0 0 0  Down, Depressed, Hopeless 0 0 0  PHQ - 2 Score 0 0 0  Altered sleeping 0 0 0  Tired, decreased energy 1 0 0  Change in appetite 0 0 0  Feeling bad or failure about yourself  0 0 0  Trouble concentrating 0 0 0  Moving slowly or fidgety/restless 0 0 0  Suicidal thoughts 0 0 0  PHQ-9 Score 1 0 0  Difficult doing work/chores Not difficult at all Not difficult at all Not difficult at all    BP Readings from Last 3 Encounters:  10/06/23 128/70  07/28/23 130/76  06/15/23 120/72    Physical Exam Vitals and nursing note reviewed.  Constitutional:      General: She is not in acute distress.    Appearance: She is well-developed.  HENT:     Head: Normocephalic and atraumatic.  Pulmonary:     Effort: Pulmonary effort is normal. No respiratory distress.  Skin:    General: Skin is warm and dry.     Findings: No rash.  Neurological:     Mental Status: She is alert and oriented to person, place, and time.  Psychiatric:        Mood and Affect: Mood normal.        Behavior: Behavior normal.     Wt Readings from Last 3 Encounters:  10/06/23 184 lb 6 oz (83.6 kg)  07/28/23 187 lb 9.6 oz (85.1 kg)  06/15/23 180 lb (81.6 kg)    BP 128/70   Pulse 84   Temp 98.1 F (36.7 C)   Ht 5\' 3"  (1.6 m)   Wt 184 lb 6 oz (83.6 kg)   SpO2 97%   BMI 32.66 kg/m   Assessment and Plan:  Problem List  Items Addressed This Visit       Unprioritized   Essential hypertension (Chronic)   Controlled BP with normal exam. Current regimen is lisinopril. Will continue same medications; encourage continued reduced sodium diet.       Hyperlipidemia associated with type 2 diabetes mellitus (HCC) (Chronic)   Unable to tolerated statins On Zetia Lab Results  Component Value Date   LDLCALC 107 (H) 06/15/2023         Type II diabetes mellitus with complication (HCC) - Primary (Chronic)   Diabetes managed with diet only. Lab Results  Component Value Date   HGBA1C 7.3 (H) 06/15/2023  A1C today = 6.7       Relevant Orders   POCT glycosylated hemoglobin (Hb A1C) (Completed)   Statin myopathy (Chronic)   Other Visit Diagnoses       Subacute frontal sinusitis       Relevant Medications   azithromycin (ZITHROMAX Z-PAK) 250 MG tablet       Return in about 4 months (around 02/03/2024) for DM, HTN.    Reubin Milan, MD [facility] [place]

## 2023-10-06 NOTE — Patient Instructions (Signed)
 Loratadine 10 mg - take one a day Drink lots of fluids

## 2023-10-07 ENCOUNTER — Encounter: Payer: Self-pay | Admitting: Internal Medicine

## 2023-11-09 ENCOUNTER — Other Ambulatory Visit: Payer: Self-pay | Admitting: Internal Medicine

## 2023-11-09 DIAGNOSIS — I10 Essential (primary) hypertension: Secondary | ICD-10-CM

## 2023-11-11 NOTE — Telephone Encounter (Signed)
 Requested Prescriptions  Pending Prescriptions Disp Refills   lisinopril (ZESTRIL) 10 MG tablet [Pharmacy Med Name: LISINOPRIL 10 MG TABLET] 180 tablet 0    Sig: TAKE 1 TABLET BY MOUTH TWICE A DAY     Cardiovascular:  ACE Inhibitors Passed - 11/11/2023  8:25 AM      Passed - Cr in normal range and within 180 days    Creatinine, Ser  Date Value Ref Range Status  06/15/2023 0.97 0.57 - 1.00 mg/dL Final         Passed - K in normal range and within 180 days    Potassium  Date Value Ref Range Status  06/15/2023 4.2 3.5 - 5.2 mmol/L Final         Passed - Patient is not pregnant      Passed - Last BP in normal range    BP Readings from Last 1 Encounters:  10/06/23 128/70         Passed - Valid encounter within last 6 months    Recent Outpatient Visits           1 month ago Type II diabetes mellitus with complication [facility])   [place] [facility] at [facility], Nyoka Cowden, MD

## 2023-11-16 DIAGNOSIS — H26493 Other secondary cataract, bilateral: Secondary | ICD-10-CM | POA: Diagnosis not present

## 2023-11-16 DIAGNOSIS — R7309 Other abnormal glucose: Secondary | ICD-10-CM | POA: Diagnosis not present

## 2023-11-16 DIAGNOSIS — Z7984 Long term (current) use of oral hypoglycemic drugs: Secondary | ICD-10-CM | POA: Diagnosis not present

## 2023-11-16 DIAGNOSIS — H40053 Ocular hypertension, bilateral: Secondary | ICD-10-CM | POA: Diagnosis not present

## 2023-11-16 DIAGNOSIS — Z961 Presence of intraocular lens: Secondary | ICD-10-CM | POA: Diagnosis not present

## 2023-11-16 DIAGNOSIS — H401131 Primary open-angle glaucoma, bilateral, mild stage: Secondary | ICD-10-CM | POA: Diagnosis not present

## 2023-11-16 DIAGNOSIS — H524 Presbyopia: Secondary | ICD-10-CM | POA: Diagnosis not present

## 2023-11-16 DIAGNOSIS — H5203 Hypermetropia, bilateral: Secondary | ICD-10-CM | POA: Diagnosis not present

## 2023-11-16 DIAGNOSIS — H52223 Regular astigmatism, bilateral: Secondary | ICD-10-CM | POA: Diagnosis not present

## 2023-11-16 LAB — HM DIABETES EYE EXAM

## 2024-02-03 ENCOUNTER — Encounter: Payer: Self-pay | Admitting: Internal Medicine

## 2024-02-03 ENCOUNTER — Ambulatory Visit (INDEPENDENT_AMBULATORY_CARE_PROVIDER_SITE_OTHER): Payer: Medicare Other | Admitting: Internal Medicine

## 2024-02-03 VITALS — BP 128/74 | HR 67 | Ht 63.0 in | Wt 182.8 lb

## 2024-02-03 DIAGNOSIS — I10 Essential (primary) hypertension: Secondary | ICD-10-CM

## 2024-02-03 DIAGNOSIS — Z1231 Encounter for screening mammogram for malignant neoplasm of breast: Secondary | ICD-10-CM

## 2024-02-03 DIAGNOSIS — E118 Type 2 diabetes mellitus with unspecified complications: Secondary | ICD-10-CM | POA: Diagnosis not present

## 2024-02-03 LAB — POCT GLYCOSYLATED HEMOGLOBIN (HGB A1C): Hemoglobin A1C: 6.7 % — AB (ref 4.0–5.6)

## 2024-02-03 NOTE — Assessment & Plan Note (Addendum)
 Blood sugars have been stable.  No recent hypoglycemic events requiring assistance. Currently medications are diet only.  She has been doing very well with diet - went on a cruise but still adhered fairly well. Lab Results  Component Value Date   HGBA1C 6.7 (A) 10/06/2023   Last visit no changes were made. A1C today = 6.7

## 2024-02-03 NOTE — Assessment & Plan Note (Addendum)
 Blood pressure is well controlled.  Current medications are lisinopril  10 mg bid. Will continue same regimen along with efforts to limit dietary sodium.

## 2024-02-03 NOTE — Patient Instructions (Signed)
 Call [facility] to schedule your mammogram at [phone].

## 2024-02-03 NOTE — Progress Notes (Signed)
 Date:  02/03/2024   Name:  Dawn Fields   DOB:  1946/03/01   MRN:  [id number]   Chief Complaint: Hypertension and Diabetes  Hypertension This is a chronic problem. The problem is controlled. Pertinent negatives include no chest pain, headaches, palpitations or shortness of breath. Past treatments include ACE inhibitors. The current treatment provides significant improvement.  Diabetes She presents for her follow-up diabetic visit. She has type 2 diabetes mellitus. Her disease course has been stable. Pertinent negatives for hypoglycemia include no headaches or tremors. Pertinent negatives for diabetes include no chest pain, no fatigue, no polydipsia and no polyuria. Current diabetic treatment includes diet.    Review of Systems  Constitutional:  Negative for appetite change, fatigue, fever and unexpected weight change.  HENT:  Negative for tinnitus and trouble swallowing.   Eyes:  Negative for visual disturbance.  Respiratory:  Negative for cough, chest tightness and shortness of breath.   Cardiovascular:  Negative for chest pain, palpitations and leg swelling.  Gastrointestinal:  Negative for abdominal pain.  Endocrine: Negative for polydipsia and polyuria.  Genitourinary:  Negative for dysuria and hematuria.  Musculoskeletal:  Negative for arthralgias.  Neurological:  Negative for tremors, numbness and headaches.  Psychiatric/Behavioral:  Negative for dysphoric mood.      Lab Results  Component Value Date   NA 138 06/15/2023   K 4.2 06/15/2023   CO2 23 06/15/2023   GLUCOSE 113 (H) 06/15/2023   BUN 16 06/15/2023   CREATININE 0.97 06/15/2023   CALCIUM  9.9 06/15/2023   EGFR 59 (L) 06/15/2023   GFRNONAA >60 03/28/2021   Lab Results  Component Value Date   CHOL 217 (H) 06/15/2023   HDL 93 06/15/2023   LDLCALC 107 (H) 06/15/2023   TRIG 97 06/15/2023   CHOLHDL 2.3 06/15/2023   Lab Results  Component Value Date   TSH 1.310 06/15/2023   Lab Results  Component Value  Date   HGBA1C 6.7 (A) 02/03/2024   Lab Results  Component Value Date   WBC 5.6 06/15/2023   HGB 11.9 06/15/2023   HCT 36.8 06/15/2023   MCV 89 06/15/2023   PLT 271 06/15/2023   Lab Results  Component Value Date   ALT 26 06/15/2023   AST 23 06/15/2023   ALKPHOS 90 06/15/2023   BILITOT 0.4 06/15/2023   No results found for: MARIEN BOLLS, VD25OH   Patient Active Problem List   Diagnosis Date Noted   Lipoma of right shoulder    Lipoma of neck 09/26/2020   Lipoma of anterior chest wall 01/04/2020   Radial scar of left breast    Abnormal breast biopsy 09/01/2019   Statin myopathy 08/13/2019   Cataracts, both eyes 04/12/2019   Carpal tunnel syndrome of right wrist 12/26/2018   Type II diabetes mellitus with complication (HCC) 12/24/2017   Knee instability, left 12/24/2017   History of colonic polyps 12/16/2016   Essential hypertension 12/11/2016   Hyperlipidemia associated with type 2 diabetes mellitus (HCC) 12/11/2016   Gastroesophageal reflux disease 12/11/2016   Kidney stones 12/11/2016   Osteopenia 12/11/2016   Multinodular goiter 06/09/2016    Allergies  Allergen Reactions   Esomeprazole Magnesium Other (See Comments)    Other Reaction: GI UPSET   Rosuvastatin Other (See Comments)    Joint and muscle pain   Atorvastatin  Other (See Comments)    Joint pain    Past Surgical History:  Procedure Laterality Date   ABDOMINAL HYSTERECTOMY     Partial - Still have ovaries. -  Cyst on uterus    BREAST BIOPSY Left 07/20/2019   sclerosing lesion   BREAST BIOPSY Left 09/29/2019   NL-neg   breast lesion removed Left 2021   BREAST LUMPECTOMY WITH NEEDLE LOCALIZATION Left 09/29/2019   Procedure: BREAST LUMPECTOMY WITH NEEDLE LOCALIZATION;  Surgeon: Desiderio Schanz, MD;  Location: [facility];  Service: General;  Laterality: Left;   BREAST SURGERY     biopsy left breast   COLONOSCOPY  02/04/2018   one polyp -    DILATION AND CURETTAGE OF UTERUS     EYE SURGERY      cateract bilateral   fibroid biopsy     LIPOMA EXCISION Right 04/03/2021   Procedure: EXCISION LIPOMA, right supraclavicular & right shoulder lipomas x 2;  Surgeon: Desiderio Schanz, MD;  Location: [facility] OR[facility];  Service: General;  Laterality: Right;  Right supraclavicular and right shoulder lipomas   TONSILLECTOMY      Social History   Tobacco Use   Smoking status: Never   Smokeless tobacco: Never  Vaping Use   Vaping status: Never Used  Substance Use Topics   Alcohol use: No   Drug use: No     Medication list has been reviewed and updated.  Current Meds  Medication Sig   acetaminophen  (TYLENOL ) 500 MG tablet Take 2 tablets (1,000 mg total) by mouth every 6 (six) hours as needed for mild pain.   aspirin EC 81 MG tablet Take 81 mg by mouth daily.   Cholecalciferol 25 MCG (1000 UT) tablet Take 1,000 Units by mouth daily.    docusate sodium (COLACE) 100 MG capsule Take 100 mg by mouth 2 (two) times daily as needed.    ezetimibe  (ZETIA ) 10 MG tablet TAKE 1 TABLET BY MOUTH EVERY DAY   glucose blood (ONETOUCH ULTRA) test strip USE AS DIRECTED ONCE DAILY   Lancets (ONETOUCH DELICA PLUS LANCET30G) MISC USE AS DIRECTED ONCE DAILY   latanoprost (XALATAN) 0.005 % ophthalmic solution Place 1 drop into both eyes at bedtime.   lisinopril  (ZESTRIL ) 10 MG tablet TAKE 1 TABLET BY MOUTH TWICE A DAY   Omega-3 Fatty Acids (FISH OIL) 1000 MG CAPS Take 2,000 mg by mouth daily.    zinc gluconate 50 MG tablet Take 50 mg by mouth daily.       10/06/2023    8:56 AM 06/15/2023   10:37 AM 10/09/2022    8:07 AM 06/01/2022    8:30 AM  GAD 7 : Generalized Anxiety Score  Nervous, Anxious, on Edge 0 0 0 0  Control/stop worrying 0 0 0 0  Worry too much - different things 0 0 0 0  Trouble relaxing 0 0 0 0  Restless 0 0 0 0  Easily annoyed or irritable 0 0 0 0  Afraid - awful might happen 0 0 0 0  Total GAD 7 Score 0 0 0 0  Anxiety Difficulty Not difficult at all Not difficult at all Not difficult at all  Not difficult at all       10/06/2023    8:56 AM 07/28/2023    3:06 PM 06/15/2023   10:37 AM  Depression screen PHQ 2/9  Decreased Interest 0 0 0  Down, Depressed, Hopeless 0 0 0  PHQ - 2 Score 0 0 0  Altered sleeping 0 0 0  Tired, decreased energy 1 0 0  Change in appetite 0 0 0  Feeling bad or failure about yourself  0 0 0  Trouble concentrating 0 0 0  Moving  slowly or fidgety/restless 0 0 0  Suicidal thoughts 0 0 0  PHQ-9 Score 1 0 0  Difficult doing work/chores Not difficult at all Not difficult at all Not difficult at all    BP Readings from Last 3 Encounters:  02/03/24 128/74  10/06/23 128/70  07/28/23 130/76    Physical Exam Vitals and nursing note reviewed.  Constitutional:      General: She is not in acute distress.    Appearance: She is well-developed.  HENT:     Head: Normocephalic and atraumatic.  Neck:     Vascular: No carotid bruit.   Cardiovascular:     Rate and Rhythm: Normal rate and regular rhythm.     Heart sounds: No murmur heard. Pulmonary:     Effort: Pulmonary effort is normal. No respiratory distress.     Breath sounds: No wheezing or rhonchi.   Musculoskeletal:     Cervical back: Normal range of motion.     Right lower leg: No edema.     Left lower leg: No edema.  Lymphadenopathy:     Cervical: No cervical adenopathy.   Skin:    General: Skin is warm and dry.     Findings: No rash.   Neurological:     General: No focal deficit present.     Mental Status: She is alert and oriented to person, place, and time.     Gait: Gait normal.   Psychiatric:        Mood and Affect: Mood normal.        Behavior: Behavior normal.     Wt Readings from Last 3 Encounters:  02/03/24 182 lb 12.8 oz (82.9 kg)  10/06/23 184 lb 6 oz (83.6 kg)  07/28/23 187 lb 9.6 oz (85.1 kg)    BP 128/74   Pulse 67   Ht 5' 3 (1.6 m)   Wt 182 lb 12.8 oz (82.9 kg)   SpO2 98%   BMI 32.38 kg/m   Assessment and Plan:  Problem List Items Addressed This  Visit       Unprioritized   Essential hypertension - Primary (Chronic)   Blood pressure is well controlled.  Current medications are lisinopril  10 mg bid. Will continue same regimen along with efforts to limit dietary sodium.       Type II diabetes mellitus with complication (HCC) (Chronic)   Blood sugars have been stable.  No recent hypoglycemic events requiring assistance. Currently medications are diet only.  She has been doing very well with diet - went on a cruise but still adhered fairly well. Lab Results  Component Value Date   HGBA1C 6.7 (A) 10/06/2023   Last visit no changes were made. A1C today = 6.7       Relevant Orders   POCT glycosylated hemoglobin (Hb A1C) (Completed)   Other Visit Diagnoses       Encounter for screening mammogram for breast cancer       Relevant Orders   MM 3D SCREENING MAMMOGRAM BILATERAL BREAST       Return in about 4 months (around 06/04/2024) for CPX.    Leita HILARIO Adie, MD [facility] [place]

## 2024-02-06 ENCOUNTER — Other Ambulatory Visit: Payer: Self-pay | Admitting: Internal Medicine

## 2024-02-06 DIAGNOSIS — I10 Essential (primary) hypertension: Secondary | ICD-10-CM

## 2024-02-08 NOTE — Telephone Encounter (Signed)
 Requested Prescriptions  Pending Prescriptions Disp Refills   lisinopril  (ZESTRIL ) 10 MG tablet [Pharmacy Med Name: LISINOPRIL  10 MG TABLET] 180 tablet 0    Sig: TAKE 1 TABLET BY MOUTH TWICE A DAY     Cardiovascular:  ACE Inhibitors Failed - 02/08/2024 12:18 PM      Failed - Cr in normal range and within 180 days    Creatinine, Ser  Date Value Ref Range Status  06/15/2023 0.97 0.57 - 1.00 mg/dL Final         Failed - K in normal range and within 180 days    Potassium  Date Value Ref Range Status  06/15/2023 4.2 3.5 - 5.2 mmol/L Final         Passed - Patient is not pregnant      Passed - Last BP in normal range    BP Readings from Last 1 Encounters:  02/03/24 128/74         Passed - Valid encounter within last 6 months    Recent Outpatient Visits           5 days ago Essential hypertension   [place] [facility] at [facility], Leita DEL, MD   4 months ago Type II diabetes mellitus with complication [facility])   [place] [facility] at [facility], Leita DEL, MD       Future Appointments             In 4 months Justus, Leita DEL, MD [facility] at [facility]

## 2024-02-28 ENCOUNTER — Telehealth: Payer: Self-pay

## 2024-02-28 ENCOUNTER — Other Ambulatory Visit: Payer: Self-pay | Admitting: Internal Medicine

## 2024-02-28 ENCOUNTER — Encounter: Payer: Self-pay | Admitting: Internal Medicine

## 2024-02-28 DIAGNOSIS — E118 Type 2 diabetes mellitus with unspecified complications: Secondary | ICD-10-CM

## 2024-02-28 MED ORDER — ACCU-CHEK GUIDE TEST VI STRP
ORAL_STRIP | 12 refills | Status: AC
Start: 2024-02-28 — End: ?

## 2024-02-28 MED ORDER — ACCU-CHEK GUIDE W/DEVICE KIT: 1.0000 | PACK | Freq: Every day | 0 refills | Status: AC

## 2024-02-28 NOTE — Telephone Encounter (Signed)
 Spoke with patient informed her of new meter and strips that had been sent in. She said thank you have a great day.   JM

## 2024-02-28 NOTE — Progress Notes (Unsigned)
 Date:  02/28/2024   Name:  Dawn Fields   DOB:  06-25-1946   MRN:  [id number]   Chief Complaint: No chief complaint on file.  HPI  Review of Systems   Lab Results  Component Value Date   NA 138 06/15/2023   K 4.2 06/15/2023   CO2 23 06/15/2023   GLUCOSE 113 (H) 06/15/2023   BUN 16 06/15/2023   CREATININE 0.97 06/15/2023   CALCIUM  9.9 06/15/2023   EGFR 59 (L) 06/15/2023   GFRNONAA >60 03/28/2021   Lab Results  Component Value Date   CHOL 217 (H) 06/15/2023   HDL 93 06/15/2023   LDLCALC 107 (H) 06/15/2023   TRIG 97 06/15/2023   CHOLHDL 2.3 06/15/2023   Lab Results  Component Value Date   TSH 1.310 06/15/2023   Lab Results  Component Value Date   HGBA1C 6.7 (A) 02/03/2024   Lab Results  Component Value Date   WBC 5.6 06/15/2023   HGB 11.9 06/15/2023   HCT 36.8 06/15/2023   MCV 89 06/15/2023   PLT 271 06/15/2023   Lab Results  Component Value Date   ALT 26 06/15/2023   AST 23 06/15/2023   ALKPHOS 90 06/15/2023   BILITOT 0.4 06/15/2023   No results found for: MARIEN BOLLS, VD25OH   Patient Active Problem List   Diagnosis Date Noted   Lipoma of right shoulder    Lipoma of neck 09/26/2020   Lipoma of anterior chest wall 01/04/2020   Radial scar of left breast    Abnormal breast biopsy 09/01/2019   Statin myopathy 08/13/2019   Cataracts, both eyes 04/12/2019   Carpal tunnel syndrome of right wrist 12/26/2018   Type II diabetes mellitus with complication (HCC) 12/24/2017   Knee instability, left 12/24/2017   History of colonic polyps 12/16/2016   Essential hypertension 12/11/2016   Hyperlipidemia associated with type 2 diabetes mellitus (HCC) 12/11/2016   Gastroesophageal reflux disease 12/11/2016   Kidney stones 12/11/2016   Osteopenia 12/11/2016   Multinodular goiter 06/09/2016    Allergies  Allergen Reactions   Esomeprazole Magnesium Other (See Comments)    Other Reaction: GI UPSET   Rosuvastatin Other (See Comments)     Joint and muscle pain   Atorvastatin  Other (See Comments)    Joint pain    Past Surgical History:  Procedure Laterality Date   ABDOMINAL HYSTERECTOMY     Partial - Still have ovaries. - Cyst on uterus    BREAST BIOPSY Left 07/20/2019   sclerosing lesion   BREAST BIOPSY Left 09/29/2019   NL-neg   breast lesion removed Left 2021   BREAST LUMPECTOMY WITH NEEDLE LOCALIZATION Left 09/29/2019   Procedure: BREAST LUMPECTOMY WITH NEEDLE LOCALIZATION;  Surgeon: Desiderio Schanz, MD;  Location: [facility] ORS;  Service: General;  Laterality: Left;   BREAST SURGERY     biopsy left breast   COLONOSCOPY  02/04/2018   one polyp -    DILATION AND CURETTAGE OF UTERUS     EYE SURGERY     cateract bilateral   fibroid biopsy     LIPOMA EXCISION Right 04/03/2021   Procedure: EXCISION LIPOMA, right supraclavicular & right shoulder lipomas x 2;  Surgeon: Desiderio Schanz, MD;  Location: [facility] ORS;  Service: General;  Laterality: Right;  Right supraclavicular and right shoulder lipomas   TONSILLECTOMY      Social History   Tobacco Use   Smoking status: Never   Smokeless tobacco: Never  Vaping Use   Vaping status: Never  Used  Substance Use Topics   Alcohol use: No   Drug use: No     Medication list has been reviewed and updated.  No outpatient medications have been marked as taking for the 02/28/24 encounter (Orders Only) with Justus Leita DEL, MD.       10/06/2023    8:56 AM 06/15/2023   10:37 AM 10/09/2022    8:07 AM 06/01/2022    8:30 AM  GAD 7 : Generalized Anxiety Score  Nervous, Anxious, on Edge 0 0 0 0  Control/stop worrying 0 0 0 0  Worry too much - different things 0 0 0 0  Trouble relaxing 0 0 0 0  Restless 0 0 0 0  Easily annoyed or irritable 0 0 0 0  Afraid - awful might happen 0 0 0 0  Total GAD 7 Score 0 0 0 0  Anxiety Difficulty Not difficult at all Not difficult at all Not difficult at all Not difficult at all       10/06/2023    8:56 AM 07/28/2023    3:06 PM 06/15/2023    10:37 AM  Depression screen PHQ 2/9  Decreased Interest 0 0 0  Down, Depressed, Hopeless 0 0 0  PHQ - 2 Score 0 0 0  Altered sleeping 0 0 0  Tired, decreased energy 1 0 0  Change in appetite 0 0 0  Feeling bad or failure about yourself  0 0 0  Trouble concentrating 0 0 0  Moving slowly or fidgety/restless 0 0 0  Suicidal thoughts 0 0 0  PHQ-9 Score 1 0 0  Difficult doing work/chores Not difficult at all Not difficult at all Not difficult at all    BP Readings from Last 3 Encounters:  02/03/24 128/74  10/06/23 128/70  07/28/23 130/76    Physical Exam  Wt Readings from Last 3 Encounters:  02/03/24 182 lb 12.8 oz (82.9 kg)  10/06/23 184 lb 6 oz (83.6 kg)  07/28/23 187 lb 9.6 oz (85.1 kg)    There were no vitals taken for this visit.  Assessment and Plan:  Problem List Items Addressed This Visit   None   No follow-ups on file.    Leita HILARIO Justus, MD [facility] [place]

## 2024-02-28 NOTE — Telephone Encounter (Signed)
 Copied from CRM [id number]. Topic: General - Other >> Feb 28, 2024  8:08 AM Suzen RAMAN wrote: Reason for CRM: Patient would like to inform provider that [facility] no longer covers her current diabetes test strip and device but the accu-check guided meter and test scripts are covered. Patient currently has enough supplies to last for the next 3 weeks but going forward she will need the alternative products that are covered through her insurance.

## 2024-02-28 NOTE — Telephone Encounter (Signed)
 [name]

## 2024-03-14 ENCOUNTER — Other Ambulatory Visit: Payer: Self-pay | Admitting: Internal Medicine

## 2024-03-15 NOTE — Telephone Encounter (Signed)
 Requested Prescriptions  Pending Prescriptions Disp Refills   Lancets (ONETOUCH DELICA PLUS LANCET33G) MISC [Pharmacy Med Name: ONE TOUCH DELICA PLUS 33G LANC] 100 each 1    Sig: USE AS DIRECTED ONCE DAILY     Endocrinology: Diabetes - Testing Supplies Passed - 03/15/2024  2:17 PM      Passed - Valid encounter within last 12 months    Recent Outpatient Visits           1 month ago Essential hypertension   [place] [facility] at [facility], Leita DEL, MD   5 months ago Type II diabetes mellitus with complication [facility])    Primary Care & Sports Medicine at [facility], Leita DEL, MD       Future Appointments             In 3 months Justus, Leita DEL, MD [facility] at [facility], [facility]

## 2024-03-20 DIAGNOSIS — R7309 Other abnormal glucose: Secondary | ICD-10-CM | POA: Diagnosis not present

## 2024-03-20 DIAGNOSIS — H26493 Other secondary cataract, bilateral: Secondary | ICD-10-CM | POA: Diagnosis not present

## 2024-03-20 DIAGNOSIS — H401131 Primary open-angle glaucoma, bilateral, mild stage: Secondary | ICD-10-CM | POA: Diagnosis not present

## 2024-03-20 DIAGNOSIS — Z961 Presence of intraocular lens: Secondary | ICD-10-CM | POA: Diagnosis not present

## 2024-03-20 DIAGNOSIS — H40053 Ocular hypertension, bilateral: Secondary | ICD-10-CM | POA: Diagnosis not present

## 2024-03-20 DIAGNOSIS — Z7984 Long term (current) use of oral hypoglycemic drugs: Secondary | ICD-10-CM | POA: Diagnosis not present

## 2024-04-07 NOTE — Progress Notes (Signed)
 [facility] Quality Team Note  Name: Dawn Fields Date of Birth: 24-Jul-1946 MRN: [id number] Date: 04/07/2024  [facility] Quality Team has reviewed this patient's chart, please see recommendations below:  [facility] Quality Other; (PATIENT DUE FOR KIDNEY HEALTH EVALUATION. NEEDS EGFR AND URINE MICROALBUMIN/CREATININE RATIO COMPLETED FOR GAP CLOSURE. CAN THIS BE COMPLETED ON 06/29/2024 DURING OFFICE VISIT?)

## 2024-04-27 ENCOUNTER — Encounter: Payer: Self-pay | Admitting: Internal Medicine

## 2024-05-05 ENCOUNTER — Other Ambulatory Visit: Payer: Self-pay | Admitting: Internal Medicine

## 2024-05-05 DIAGNOSIS — I10 Essential (primary) hypertension: Secondary | ICD-10-CM

## 2024-05-05 NOTE — Telephone Encounter (Signed)
 Requested Prescriptions  Pending Prescriptions Disp Refills   lisinopril  (ZESTRIL ) 10 MG tablet [Pharmacy Med Name: LISINOPRIL  10 MG TABLET] 180 tablet 0    Sig: TAKE 1 TABLET BY MOUTH TWICE A DAY     Cardiovascular:  ACE Inhibitors Failed - 05/05/2024  4:02 PM      Failed - Cr in normal range and within 180 days    Creatinine, Ser  Date Value Ref Range Status  06/15/2023 0.97 0.57 - 1.00 mg/dL Final         Failed - K in normal range and within 180 days    Potassium  Date Value Ref Range Status  06/15/2023 4.2 3.5 - 5.2 mmol/L Final         Passed - Patient is not pregnant      Passed - Last BP in normal range    BP Readings from Last 1 Encounters:  02/03/24 128/74         Passed - Valid encounter within last 6 months    Recent Outpatient Visits           3 months ago Essential hypertension   [place] [facility] at [facility], Leita DEL, MD   7 months ago Type II diabetes mellitus with complication [facility])    Primary Care & Sports Medicine at [facility], Leita DEL, MD       Future Appointments             In 1 month Justus, Leita DEL, MD [facility] Medicine at [facility], [phone] [place]

## 2024-06-26 ENCOUNTER — Ambulatory Visit
Admission: RE | Admit: 2024-06-26 | Discharge: 2024-06-26 | Disposition: A | Discharge status: Home / Self Care | Source: Ambulatory Visit | Attending: Internal Medicine | Admitting: Internal Medicine

## 2024-06-26 DIAGNOSIS — Z1231 Encounter for screening mammogram for malignant neoplasm of breast: Secondary | ICD-10-CM | POA: Insufficient documentation

## 2024-06-27 ENCOUNTER — Other Ambulatory Visit: Payer: Self-pay | Admitting: Internal Medicine

## 2024-06-27 DIAGNOSIS — R928 Other abnormal and inconclusive findings on diagnostic imaging of breast: Secondary | ICD-10-CM

## 2024-06-28 ENCOUNTER — Ambulatory Visit
Admission: RE | Admit: 2024-06-28 | Discharge: 2024-06-28 | Disposition: A | Discharge status: Home / Self Care | Source: Ambulatory Visit | Attending: Internal Medicine | Admitting: Internal Medicine

## 2024-06-28 ENCOUNTER — Inpatient Hospital Stay: Admission: RE | Admit: 2024-06-28 | Discharge: 2024-06-28 | Attending: Internal Medicine | Admitting: Internal Medicine

## 2024-06-28 DIAGNOSIS — R928 Other abnormal and inconclusive findings on diagnostic imaging of breast: Secondary | ICD-10-CM | POA: Diagnosis present

## 2024-06-29 ENCOUNTER — Encounter: Payer: Self-pay | Admitting: Internal Medicine

## 2024-06-29 ENCOUNTER — Ambulatory Visit (INDEPENDENT_AMBULATORY_CARE_PROVIDER_SITE_OTHER): Admitting: Internal Medicine

## 2024-06-29 VITALS — BP 122/74 | HR 73 | Ht 63.0 in | Wt 189.0 lb

## 2024-06-29 DIAGNOSIS — G720 Drug-induced myopathy: Secondary | ICD-10-CM | POA: Diagnosis not present

## 2024-06-29 DIAGNOSIS — E042 Nontoxic multinodular goiter: Secondary | ICD-10-CM

## 2024-06-29 DIAGNOSIS — E1169 Type 2 diabetes mellitus with other specified complication: Secondary | ICD-10-CM | POA: Diagnosis not present

## 2024-06-29 DIAGNOSIS — Z23 Encounter for immunization: Secondary | ICD-10-CM | POA: Diagnosis not present

## 2024-06-29 DIAGNOSIS — K219 Gastro-esophageal reflux disease without esophagitis: Secondary | ICD-10-CM

## 2024-06-29 DIAGNOSIS — E118 Type 2 diabetes mellitus with unspecified complications: Secondary | ICD-10-CM

## 2024-06-29 DIAGNOSIS — Z0000 Encounter for general adult medical examination without abnormal findings: Secondary | ICD-10-CM

## 2024-06-29 DIAGNOSIS — I10 Essential (primary) hypertension: Secondary | ICD-10-CM | POA: Diagnosis not present

## 2024-06-29 DIAGNOSIS — E785 Hyperlipidemia, unspecified: Secondary | ICD-10-CM

## 2024-06-29 DIAGNOSIS — T466X5A Adverse effect of antihyperlipidemic and antiarteriosclerotic drugs, initial encounter: Secondary | ICD-10-CM

## 2024-06-29 MED ORDER — FAMOTIDINE 20 MG PO TABS: 20.0000 mg | ORAL_TABLET | Freq: Two times a day (BID) | ORAL | 0 refills | Status: DC

## 2024-06-29 MED ORDER — ONETOUCH DELICA PLUS LANCET33G MISC: 1.0000 | Freq: Every day | 1 refills | Status: AC

## 2024-06-29 NOTE — Progress Notes (Addendum)
 Date:  06/29/2024   Name:  Dawn Fields   DOB:  1946-03-13   MRN:  [id number]   Chief Complaint: Annual Exam and Gastroesophageal Reflux (Heart burn for the last 2 months on and off getting worse. Wants Rx. ) Dawn Fields is a 78 y.o. female who presents today for her Complete Annual Exam. She feels fairly well. She reports exercising. She reports she is sleeping fairly well. Breast complaints - none.  Health Maintenance  Topic Date Due   Yearly kidney function blood test for diabetes  06/14/2024   Yearly kidney health urinalysis for diabetes  06/14/2024   Medicare Annual Wellness Visit  07/27/2024   Hemoglobin A1C  08/04/2024   COVID-19 Vaccine (8 - 2025-26 season) 10/29/2024   Eye exam for diabetics  11/15/2024   Breast Cancer Screening  06/26/2025   Complete foot exam   06/29/2025   Colon Cancer Screening  05/19/2028   DTaP/Tdap/Td vaccine (3 - Td or Tdap) 11/20/2031   Pneumococcal Vaccine for age over 63  Completed   Flu Shot  Completed   Osteoporosis screening with Bone Density Scan  Completed   Zoster (Shingles) Vaccine  Completed   Meningitis B Vaccine  Aged Out   Hepatitis C Screening  Discontinued    Hypertension This is a chronic problem. The problem is controlled. Pertinent negatives include no chest pain, headaches, palpitations or shortness of breath. Past treatments include ACE inhibitors. The current treatment provides significant improvement. Hypertensive end-organ damage includes kidney disease. There is no history of CAD/MI or CVA.  Diabetes She presents for her follow-up diabetic visit. She has type 2 diabetes mellitus. Her disease course has been stable. Pertinent negatives for hypoglycemia include no dizziness, headaches or nervousness/anxiousness. Pertinent negatives for diabetes include no chest pain, no fatigue and no weakness. Pertinent negatives for diabetic complications include no CVA. Current diabetic treatment includes diet.  Hyperlipidemia This is  a chronic problem. Recent lipid tests were reviewed and are high. Pertinent negatives include no chest pain, myalgias or shortness of breath. Current antihyperlipidemic treatment includes ezetimibe . The current treatment provides moderate improvement of lipids.  Gastroesophageal Reflux She complains of heartburn. She reports no abdominal pain, no chest pain, no coughing, no nausea or no wheezing. This is a recurrent problem. The heartburn is of mild intensity. The heartburn does not wake her from sleep. The heartburn does not limit her activity. The heartburn changes with position. The symptoms are aggravated by certain foods. Pertinent negatives include no fatigue.    Review of Systems  Constitutional:  Negative for fatigue and unexpected weight change.  HENT:  Negative for trouble swallowing.   Eyes:  Negative for visual disturbance.  Respiratory:  Negative for cough, chest tightness, shortness of breath and wheezing.   Cardiovascular:  Negative for chest pain, palpitations and leg swelling.  Gastrointestinal:  Positive for heartburn. Negative for abdominal pain, constipation, diarrhea, nausea and vomiting.  Genitourinary:  Negative for dysuria and hematuria.  Musculoskeletal:  Negative for arthralgias and myalgias.  Neurological:  Negative for dizziness, weakness, light-headedness and headaches.  Psychiatric/Behavioral:  Negative for dysphoric mood and sleep disturbance. The patient is not nervous/anxious.      Lab Results  Component Value Date   NA 138 06/15/2023   K 4.2 06/15/2023   CO2 23 06/15/2023   GLUCOSE 113 (H) 06/15/2023   BUN 16 06/15/2023   CREATININE 0.97 06/15/2023   CALCIUM  9.9 06/15/2023   EGFR 59 (L) 06/15/2023   GFRNONAA >60 03/28/2021  Lab Results  Component Value Date   CHOL 217 (H) 06/15/2023   HDL 93 06/15/2023   LDLCALC 107 (H) 06/15/2023   TRIG 97 06/15/2023   CHOLHDL 2.3 06/15/2023   Lab Results  Component Value Date   TSH 1.310 06/15/2023   Lab  Results  Component Value Date   HGBA1C 6.7 (A) 02/03/2024   Lab Results  Component Value Date   WBC 5.6 06/15/2023   HGB 11.9 06/15/2023   HCT 36.8 06/15/2023   MCV 89 06/15/2023   PLT 271 06/15/2023   Lab Results  Component Value Date   ALT 26 06/15/2023   AST 23 06/15/2023   ALKPHOS 90 06/15/2023   BILITOT 0.4 06/15/2023   No results found for: MARIEN BOLLS, VD25OH   Patient Active Problem List   Diagnosis Date Noted   Lipoma of right shoulder    Lipoma of neck 09/26/2020   Lipoma of anterior chest wall 01/04/2020   Radial scar of left breast    Abnormal breast biopsy 09/01/2019   Statin myopathy 08/13/2019   Cataracts, both eyes 04/12/2019   Carpal tunnel syndrome of right wrist 12/26/2018   Type II diabetes mellitus with complication (HCC) 12/24/2017   Knee instability, left 12/24/2017   History of colonic polyps 12/16/2016   Essential hypertension 12/11/2016   Hyperlipidemia associated with type 2 diabetes mellitus (HCC) 12/11/2016   Gastroesophageal reflux disease 12/11/2016   Kidney stones 12/11/2016   Osteopenia 12/11/2016   Multinodular goiter 06/09/2016    Allergies  Allergen Reactions   Esomeprazole Magnesium Other (See Comments)    Other Reaction: GI UPSET   Rosuvastatin Other (See Comments)    Joint and muscle pain   Atorvastatin  Other (See Comments)    Joint pain    Past Surgical History:  Procedure Laterality Date   ABDOMINAL HYSTERECTOMY     Partial - Still have ovaries. - Cyst on uterus    BREAST BIOPSY Left 07/20/2019   sclerosing lesion   BREAST BIOPSY Left 09/29/2019   NL-neg   breast lesion removed Left 2021   BREAST LUMPECTOMY WITH NEEDLE LOCALIZATION Left 09/29/2019   Procedure: BREAST LUMPECTOMY WITH NEEDLE LOCALIZATION;  Surgeon: Desiderio Schanz, MD;  Location: [facility] ORS;  Service: General;  Laterality: Left;   BREAST SURGERY     biopsy left breast   COLONOSCOPY  02/04/2018   one polyp -    DILATION AND CURETTAGE  OF UTERUS     EYE SURGERY     cateract bilateral   fibroid biopsy     LIPOMA EXCISION Right 04/03/2021   Procedure: EXCISION LIPOMA, right supraclavicular & right shoulder lipomas x 2;  Surgeon: Desiderio Schanz, MD;  Location: [facility] ORS;  Service: General;  Laterality: Right;  Right supraclavicular and right shoulder lipomas   TONSILLECTOMY      Social History   Tobacco Use   Smoking status: Never   Smokeless tobacco: Never  Vaping Use   Vaping status: Never Used  Substance Use Topics   Alcohol use: No   Drug use: No     Medication list has been reviewed and updated.  Current Meds  Medication Sig   acetaminophen  (TYLENOL ) 500 MG tablet Take 2 tablets (1,000 mg total) by mouth every 6 (six) hours as needed for mild pain.   aspirin EC 81 MG tablet Take 81 mg by mouth daily.   Blood Glucose Monitoring Suppl (ACCU-CHEK GUIDE) w/Device KIT 1 each by Does not apply route daily at 6 (six) AM.  Cholecalciferol 25 MCG (1000 UT) tablet Take 1,000 Units by mouth daily.    docusate sodium (COLACE) 100 MG capsule Take 100 mg by mouth 2 (two) times daily as needed.    ezetimibe  (ZETIA ) 10 MG tablet TAKE 1 TABLET BY MOUTH EVERY DAY   famotidine  (PEPCID ) 20 MG tablet Take 1 tablet (20 mg total) by mouth 2 (two) times daily.   glucose blood (ACCU-CHEK GUIDE TEST) test strip Use as instructed   latanoprost (XALATAN) 0.005 % ophthalmic solution Place 1 drop into both eyes at bedtime.   lisinopril  (ZESTRIL ) 10 MG tablet TAKE 1 TABLET BY MOUTH TWICE A DAY   Omega-3 Fatty Acids (FISH OIL) 1000 MG CAPS Take 2,000 mg by mouth daily.    zinc gluconate 50 MG tablet Take 50 mg by mouth daily.   [DISCONTINUED] Lancets (ONETOUCH DELICA PLUS LANCET33G) MISC USE AS DIRECTED ONCE DAILY       06/29/2024    8:04 AM 10/06/2023    8:56 AM 06/15/2023   10:37 AM 10/09/2022    8:07 AM  GAD 7 : Generalized Anxiety Score  Nervous, Anxious, on Edge 0 0 0 0  Control/stop worrying 0 0 0 0  Worry too much - different  things 0 0 0 0  Trouble relaxing 0 0 0 0  Restless 0 0 0 0  Easily annoyed or irritable 0 0 0 0  Afraid - awful might happen 0 0 0 0  Total GAD 7 Score 0 0 0 0  Anxiety Difficulty Not difficult at all Not difficult at all Not difficult at all Not difficult at all       06/29/2024    8:04 AM 10/06/2023    8:56 AM 07/28/2023    3:06 PM  Depression screen PHQ 2/9  Decreased Interest 0 0 0  Down, Depressed, Hopeless 0 0 0  PHQ - 2 Score 0 0 0  Altered sleeping 0 0 0  Tired, decreased energy 0 1 0  Change in appetite 0 0 0  Feeling bad or failure about yourself  0 0 0  Trouble concentrating 0 0 0  Moving slowly or fidgety/restless 0 0 0  Suicidal thoughts 0 0 0  PHQ-9 Score 0 1  0   Difficult doing work/chores Not difficult at all Not difficult at all Not difficult at all     Data saved with a previous flowsheet row definition    BP Readings from Last 3 Encounters:  06/29/24 122/74  02/03/24 128/74  10/06/23 128/70    Physical Exam Vitals and nursing note reviewed.  Constitutional:      General: She is not in acute distress.    Appearance: She is well-developed.  HENT:     Head: Normocephalic and atraumatic.     Right Ear: Tympanic membrane and ear canal normal.     Left Ear: Tympanic membrane and ear canal normal.     Nose:     Right Sinus: No maxillary sinus tenderness.     Left Sinus: No maxillary sinus tenderness.  Eyes:     General: No scleral icterus.       Right eye: No discharge.        Left eye: No discharge.     Conjunctiva/sclera: Conjunctivae normal.  Neck:     Thyroid : No thyromegaly.     Vascular: No carotid bruit.  Cardiovascular:     Rate and Rhythm: Normal rate and regular rhythm.     Pulses: Normal pulses.  Heart sounds: Normal heart sounds.  Pulmonary:     Effort: Pulmonary effort is normal. No respiratory distress.     Breath sounds: No wheezing.  Abdominal:     General: Bowel sounds are normal.     Palpations: Abdomen is soft.      Tenderness: There is no abdominal tenderness.  Musculoskeletal:     Cervical back: Normal range of motion. No erythema.     Right lower leg: No edema.     Left lower leg: No edema.  Lymphadenopathy:     Cervical: No cervical adenopathy.  Skin:    General: Skin is warm and dry.     Findings: No rash.  Neurological:     General: No focal deficit present.     Mental Status: She is alert and oriented to person, place, and time.     Cranial Nerves: No cranial nerve deficit.     Sensory: No sensory deficit.     Deep Tendon Reflexes: Reflexes are normal and symmetric.  Psychiatric:        Attention and Perception: Attention normal.        Mood and Affect: Mood normal.    Diabetic Foot Exam - Simple   Simple Foot Form Diabetic Foot exam was performed with the following findings: Yes 06/29/2024  8:29 AM  Visual Inspection No deformities, no ulcerations, no other skin breakdown bilaterally: Yes Sensation Testing Intact to touch and monofilament testing bilaterally: Yes Pulse Check Posterior Tibialis and Dorsalis pulse intact bilaterally: Yes Comments      Wt Readings from Last 3 Encounters:  06/29/24 189 lb (85.7 kg)  02/03/24 182 lb 12.8 oz (82.9 kg)  10/06/23 184 lb 6 oz (83.6 kg)    BP 122/74   Pulse 73   Ht 5' 3 (1.6 m)   Wt 189 lb (85.7 kg)   SpO2 97%   BMI 33.48 kg/m   Assessment and Plan:  Problem List Items Addressed This Visit       Unprioritized   Essential hypertension (Chronic)   Well controlled blood pressure today. Current regimen is lisinopril . No medication side effects noted.        Relevant Orders   CBC with Differential/Platelet   Comprehensive metabolic panel with GFR   Hyperlipidemia associated with type 2 diabetes mellitus (HCC) (Chronic)   LDL is  Lab Results  Component Value Date   LDLCALC 107 (H) 06/15/2023    Current medication regimen is Zetia  Goal LDL is < 70.       Relevant Orders   Lipid panel   Gastroesophageal  reflux disease   Symptoms had been controlled until recently - not sure of the trigger Will start Pepcid  20 mg bid x 30 days.      Relevant Medications   famotidine  (PEPCID ) 20 MG tablet   Multinodular goiter (Chronic)   She is asymptomatic.  Has not seen ENT recently. Will check labs      Relevant Orders   TSH + free T4   Type II diabetes mellitus with complication (HCC) (Chronic)   Currently medications are none.  No hypoglycemic episodes noted. Home blood sugars in the 140 range. Last visit medical regimen changes were none.  Currently diet controlled only. Lab Results  Component Value Date   HGBA1C 6.7 (A) 02/03/2024          Relevant Medications   Lancets (ONETOUCH DELICA PLUS LANCET33G) MISC   Other Relevant Orders   Comprehensive metabolic panel with GFR  Hemoglobin A1c   Microalbumin / creatinine urine ratio   Statin myopathy (Chronic)   Other Visit Diagnoses       Annual physical exam    -  Primary   MM done recently with right follow up Dx - negative     Encounter for immunization       Relevant Orders   Pneumococcal conjugate vaccine 20-valent (Completed)       Return in about 4 months (around 10/27/2024) for DM, HTN  Dr. Lemon.    Leita HILARIO Adie, MD [facility] [place]

## 2024-06-29 NOTE — Assessment & Plan Note (Signed)
 Currently medications are none.  No hypoglycemic episodes noted. Home blood sugars in the 140 range. Last visit medical regimen changes were none.  Currently diet controlled only. Lab Results  Component Value Date   HGBA1C 6.7 (A) 02/03/2024

## 2024-06-29 NOTE — Assessment & Plan Note (Signed)
 Well controlled blood pressure today. Current regimen is lisinopril . No medication side effects noted.

## 2024-06-29 NOTE — Assessment & Plan Note (Signed)
 She is asymptomatic.  Has not seen ENT recently. Will check labs

## 2024-06-29 NOTE — Assessment & Plan Note (Signed)
 LDL is  Lab Results  Component Value Date   LDLCALC 107 (H) 06/15/2023    Current medication regimen is Zetia  Goal LDL is < 70.

## 2024-06-29 NOTE — Assessment & Plan Note (Signed)
 Symptoms had been controlled until recently - not sure of the trigger Will start Pepcid  20 mg bid x 30 days.

## 2024-06-30 LAB — COMPREHENSIVE METABOLIC PANEL WITH GFR
ALT: 31 IU/L (ref 0–32)
AST: 30 IU/L (ref 0–40)
Albumin: 4.3 g/dL (ref 3.8–4.8)
Alkaline Phosphatase: 86 IU/L (ref 49–135)
BUN/Creatinine Ratio: 15 (ref 12–28)
BUN: 14 mg/dL (ref 8–27)
Bilirubin Total: 0.4 mg/dL (ref 0.0–1.2)
CO2: 22 mmol/L (ref 20–29)
Calcium: 9.9 mg/dL (ref 8.7–10.3)
Chloride: 103 mmol/L (ref 96–106)
Creatinine, Ser: 0.91 mg/dL (ref 0.57–1.00)
Globulin, Total: 2.8 g/dL (ref 1.5–4.5)
Glucose: 137 mg/dL — ABNORMAL HIGH (ref 70–99)
Potassium: 4.4 mmol/L (ref 3.5–5.2)
Sodium: 140 mmol/L (ref 134–144)
Total Protein: 7.1 g/dL (ref 6.0–8.5)
eGFR: 64 mL/min/1.73 (ref >59)

## 2024-06-30 LAB — CBC WITH DIFFERENTIAL/PLATELET
Basophils Absolute: 0 x10E3/uL (ref 0.0–0.2)
Basos: 1 %
EOS (ABSOLUTE): 0.1 x10E3/uL (ref 0.0–0.4)
Eos: 3 %
Hematocrit: 36.1 % (ref 34.0–46.6)
Hemoglobin: 11.8 g/dL (ref 11.1–15.9)
Immature Grans (Abs): 0 x10E3/uL (ref 0.0–0.1)
Immature Granulocytes: 0 %
Lymphocytes Absolute: 2.4 x10E3/uL (ref 0.7–3.1)
Lymphs: 49 %
MCH: 30.1 pg (ref 26.6–33.0)
MCHC: 32.7 g/dL (ref 31.5–35.7)
MCV: 92 fL (ref 79–97)
Monocytes Absolute: 0.4 x10E3/uL (ref 0.1–0.9)
Monocytes: 8 %
Neutrophils Absolute: 1.9 x10E3/uL (ref 1.4–7.0)
Neutrophils: 39 %
Platelets: 263 x10E3/uL (ref 150–450)
RBC: 3.92 x10E6/uL (ref 3.77–5.28)
RDW: 12.4 % (ref 11.7–15.4)
WBC: 4.8 x10E3/uL (ref 3.4–10.8)

## 2024-06-30 LAB — TSH+FREE T4
Free T4: 1.12 ng/dL (ref 0.82–1.77)
TSH: 1.18 u[IU]/mL (ref 0.450–4.500)

## 2024-06-30 LAB — HEMOGLOBIN A1C
Est. average glucose Bld gHb Est-mCnc: 186 mg/dL
Hgb A1c MFr Bld: 8.1 % — ABNORMAL HIGH (ref 4.8–5.6)

## 2024-06-30 LAB — MICROALBUMIN / CREATININE URINE RATIO
Creatinine, Urine: 114.4 mg/dL
Microalb/Creat Ratio: 5 mg/g{creat} (ref 0–29)
Microalbumin, Urine: 5.2 ug/mL

## 2024-06-30 LAB — LIPID PANEL
Chol/HDL Ratio: 2.3 ratio (ref 0.0–4.4)
Cholesterol, Total: 198 mg/dL (ref 100–199)
HDL: 87 mg/dL (ref >39)
LDL Chol Calc (NIH): 96 mg/dL (ref 0–99)
Triglycerides: 87 mg/dL (ref 0–149)
VLDL Cholesterol Cal: 15 mg/dL (ref 5–40)

## 2024-07-03 ENCOUNTER — Ambulatory Visit: Payer: Self-pay | Admitting: Internal Medicine

## 2024-07-03 DIAGNOSIS — E118 Type 2 diabetes mellitus with unspecified complications: Secondary | ICD-10-CM

## 2024-07-03 MED ORDER — METFORMIN HCL ER 500 MG PO TB24: 500.0000 mg | ORAL_TABLET | Freq: Every day | ORAL | 1 refills | Status: AC

## 2024-07-08 ENCOUNTER — Other Ambulatory Visit: Payer: Self-pay | Admitting: Internal Medicine

## 2024-07-08 DIAGNOSIS — E1169 Type 2 diabetes mellitus with other specified complication: Secondary | ICD-10-CM

## 2024-07-12 NOTE — Telephone Encounter (Signed)
 Requested Prescriptions  Pending Prescriptions Disp Refills   ezetimibe  (ZETIA ) 10 MG tablet [Pharmacy Med Name: EZETIMIBE  10 MG TABLET] 90 tablet 0    Sig: TAKE 1 TABLET BY MOUTH EVERY DAY     Cardiovascular:  Antilipid - Sterol Transport Inhibitors Failed - 07/12/2024 10:42 AM      Failed - Lipid Panel in normal range within the last 12 months    Cholesterol, Total  Date Value Ref Range Status  06/29/2024 198 100 - 199 mg/dL Final   LDL Chol Calc (NIH)  Date Value Ref Range Status  06/29/2024 96 0 - 99 mg/dL Final   HDL  Date Value Ref Range Status  06/29/2024 87 >39 mg/dL Final   Triglycerides  Date Value Ref Range Status  06/29/2024 87 0 - 149 mg/dL Final         Passed - AST in normal range and within 360 days    AST  Date Value Ref Range Status  06/29/2024 30 0 - 40 IU/L Final         Passed - ALT in normal range and within 360 days    ALT  Date Value Ref Range Status  06/29/2024 31 0 - 32 IU/L Final         Passed - Patient is not pregnant      Passed - Valid encounter within last 12 months    Recent Outpatient Visits           1 week ago Annual physical exam   [place] [facility] at [facility], Leita DEL, MD   5 months ago Essential hypertension   [place] [facility] at [facility], Leita DEL, MD   9 months ago Type II diabetes mellitus with complication [facility])   [place] [facility] at [facility], Leita DEL, MD

## 2024-07-21 ENCOUNTER — Other Ambulatory Visit: Payer: Self-pay | Admitting: Internal Medicine

## 2024-07-21 DIAGNOSIS — K219 Gastro-esophageal reflux disease without esophagitis: Secondary | ICD-10-CM

## 2024-07-24 NOTE — Telephone Encounter (Signed)
 Requested medication (s) are due for refill today - unsure  Requested medication (s) are on the active medication list -yes  Future visit scheduled -yes  Last refill: 06/29/24 #60  Notes to clinic: New start medication- given 30 day supply- is this a medication to continue?   Requested Prescriptions  Pending Prescriptions Disp Refills   famotidine  (PEPCID ) 20 MG tablet [Pharmacy Med Name: FAMOTIDINE  20 MG TABLET] 180 tablet 1    Sig: TAKE 1 TABLET BY MOUTH TWICE A DAY     Gastroenterology:  H2 Antagonists Passed - 07/24/2024  2:56 PM      Passed - Valid encounter within last 12 months    Recent Outpatient Visits           3 weeks ago Annual physical exam   [place] [facility], Leita DEL, MD   5 months ago Essential hypertension   [place] [facility] at [facility], Leita DEL, MD   9 months ago Type II diabetes mellitus with complication [facility])   [place] [facility] at [facility], Leita DEL, MD                 Requested Prescriptions  Pending Prescriptions Disp Refills   famotidine  (PEPCID ) 20 MG tablet [Pharmacy Med Name: FAMOTIDINE  20 MG TABLET] 180 tablet 1    Sig: TAKE 1 TABLET BY MOUTH TWICE A DAY     Gastroenterology:  H2 Antagonists Passed - 07/24/2024  2:56 PM      Passed - Valid encounter within last 12 months    Recent Outpatient Visits           3 weeks ago Annual physical exam   [place] [facility] at [facility], Leita DEL, MD   5 months ago Essential hypertension   [place] [facility] at [facility], Leita DEL, MD   9 months ago Type II diabetes mellitus with complication [facility])   [place] [facility] at [facility], Leita DEL, MD

## 2024-08-02 ENCOUNTER — Other Ambulatory Visit: Payer: Self-pay | Admitting: Internal Medicine

## 2024-08-02 DIAGNOSIS — I10 Essential (primary) hypertension: Secondary | ICD-10-CM

## 2024-08-04 NOTE — Telephone Encounter (Signed)
 Requested Prescriptions  Pending Prescriptions Disp Refills   lisinopril  (ZESTRIL ) 10 MG tablet [Pharmacy Med Name: LISINOPRIL  10 MG TABLET] 180 tablet 0    Sig: TAKE 1 TABLET BY MOUTH TWICE A DAY     Cardiovascular:  ACE Inhibitors Passed - 08/04/2024  9:28 AM      Passed - Cr in normal range and within 180 days    Creatinine, Ser  Date Value Ref Range Status  06/29/2024 0.91 0.57 - 1.00 mg/dL Final         Passed - K in normal range and within 180 days    Potassium  Date Value Ref Range Status  06/29/2024 4.4 3.5 - 5.2 mmol/L Final         Passed - Patient is not pregnant      Passed - Last BP in normal range    BP Readings from Last 1 Encounters:  06/29/24 122/74         Passed - Valid encounter within last 6 months    Recent Outpatient Visits           1 month ago Annual physical exam   [place] [facility], Leita DEL, MD   6 months ago Essential hypertension   [place] [facility] at [facility], Leita DEL, MD   10 months ago Type II diabetes mellitus with complication [facility])   [place] [facility] at [facility], Leita DEL, MD

## 2024-09-21 ENCOUNTER — Ambulatory Visit

## 2024-10-27 ENCOUNTER — Ambulatory Visit: Admitting: Student
